# Patient Record
Sex: Male | Born: 1970 | Race: White | Hispanic: No | Marital: Married | Smoking: Current every day smoker
Health system: Southern US, Community
[De-identification: ages and names within clinical notes are randomized; demographics above are authoritative.]

## PROBLEM LIST (undated history)

## (undated) DIAGNOSIS — M059 Rheumatoid arthritis with rheumatoid factor, unspecified: Secondary | ICD-10-CM

## (undated) DIAGNOSIS — Z21 Asymptomatic human immunodeficiency virus [HIV] infection status: Secondary | ICD-10-CM

## (undated) DIAGNOSIS — B20 Human immunodeficiency virus [HIV] disease: Secondary | ICD-10-CM

## (undated) DIAGNOSIS — I209 Angina pectoris, unspecified: Secondary | ICD-10-CM

## (undated) HISTORY — PX: OTHER SURGICAL HISTORY: SHX169

## (undated) HISTORY — DX: Rheumatoid arthritis with rheumatoid factor, unspecified: M05.9

## (undated) HISTORY — DX: Asymptomatic human immunodeficiency virus (hiv) infection status: Z21

## (undated) HISTORY — DX: Human immunodeficiency virus (HIV) disease: B20

## (undated) HISTORY — DX: Angina pectoris, unspecified: I20.9

## (undated) HISTORY — PX: KNEE ARTHROSCOPY: SUR90

---

## 2017-04-27 DIAGNOSIS — R079 Chest pain, unspecified: Secondary | ICD-10-CM

## 2018-04-08 DIAGNOSIS — IMO0002 Reserved for concepts with insufficient information to code with codable children: Secondary | ICD-10-CM | POA: Insufficient documentation

## 2018-06-19 DIAGNOSIS — M25861 Other specified joint disorders, right knee: Secondary | ICD-10-CM | POA: Diagnosis not present

## 2018-06-19 DIAGNOSIS — G8918 Other acute postprocedural pain: Secondary | ICD-10-CM | POA: Diagnosis not present

## 2018-06-19 DIAGNOSIS — M009 Pyogenic arthritis, unspecified: Secondary | ICD-10-CM | POA: Diagnosis not present

## 2018-06-19 DIAGNOSIS — D2121 Benign neoplasm of connective and other soft tissue of right lower limb, including hip: Secondary | ICD-10-CM | POA: Diagnosis not present

## 2018-06-19 DIAGNOSIS — M00861 Arthritis due to other bacteria, right knee: Secondary | ICD-10-CM | POA: Diagnosis not present

## 2018-06-21 DIAGNOSIS — D499 Neoplasm of unspecified behavior of unspecified site: Secondary | ICD-10-CM | POA: Insufficient documentation

## 2018-08-17 ENCOUNTER — Encounter (HOSPITAL_COMMUNITY): Payer: Self-pay | Admitting: *Deleted

## 2018-08-17 ENCOUNTER — Inpatient Hospital Stay (HOSPITAL_COMMUNITY)
Admission: EM | Admit: 2018-08-17 | Discharge: 2018-08-22 | DRG: 865 | Disposition: A | Discharge status: Home / Self Care | Payer: Medicaid Other | Attending: Internal Medicine | Admitting: Internal Medicine

## 2018-08-17 ENCOUNTER — Inpatient Hospital Stay (HOSPITAL_COMMUNITY): Payer: Medicaid Other

## 2018-08-17 ENCOUNTER — Other Ambulatory Visit: Payer: Self-pay

## 2018-08-17 DIAGNOSIS — R269 Unspecified abnormalities of gait and mobility: Secondary | ICD-10-CM | POA: Diagnosis present

## 2018-08-17 DIAGNOSIS — R64 Cachexia: Secondary | ICD-10-CM | POA: Diagnosis present

## 2018-08-17 DIAGNOSIS — K219 Gastro-esophageal reflux disease without esophagitis: Secondary | ICD-10-CM | POA: Diagnosis present

## 2018-08-17 DIAGNOSIS — Z681 Body mass index (BMI) 19 or less, adult: Secondary | ICD-10-CM | POA: Diagnosis not present

## 2018-08-17 DIAGNOSIS — D61818 Other pancytopenia: Secondary | ICD-10-CM

## 2018-08-17 DIAGNOSIS — M659 Synovitis and tenosynovitis, unspecified: Secondary | ICD-10-CM | POA: Diagnosis present

## 2018-08-17 DIAGNOSIS — E43 Unspecified severe protein-calorie malnutrition: Secondary | ICD-10-CM | POA: Diagnosis present

## 2018-08-17 DIAGNOSIS — M6250 Muscle wasting and atrophy, not elsewhere classified, unspecified site: Secondary | ICD-10-CM | POA: Diagnosis present

## 2018-08-17 DIAGNOSIS — D638 Anemia in other chronic diseases classified elsewhere: Secondary | ICD-10-CM | POA: Diagnosis present

## 2018-08-17 DIAGNOSIS — R5381 Other malaise: Secondary | ICD-10-CM | POA: Diagnosis present

## 2018-08-17 DIAGNOSIS — R531 Weakness: Secondary | ICD-10-CM

## 2018-08-17 DIAGNOSIS — R197 Diarrhea, unspecified: Secondary | ICD-10-CM | POA: Diagnosis present

## 2018-08-17 DIAGNOSIS — R21 Rash and other nonspecific skin eruption: Secondary | ICD-10-CM

## 2018-08-17 DIAGNOSIS — M25461 Effusion, right knee: Secondary | ICD-10-CM | POA: Diagnosis present

## 2018-08-17 DIAGNOSIS — Z8614 Personal history of Methicillin resistant Staphylococcus aureus infection: Secondary | ICD-10-CM | POA: Diagnosis present

## 2018-08-17 DIAGNOSIS — M069 Rheumatoid arthritis, unspecified: Secondary | ICD-10-CM | POA: Diagnosis present

## 2018-08-17 DIAGNOSIS — M25561 Pain in right knee: Secondary | ICD-10-CM | POA: Diagnosis present

## 2018-08-17 DIAGNOSIS — M25422 Effusion, left elbow: Secondary | ICD-10-CM | POA: Diagnosis present

## 2018-08-17 DIAGNOSIS — M25421 Effusion, right elbow: Secondary | ICD-10-CM | POA: Diagnosis present

## 2018-08-17 DIAGNOSIS — M25521 Pain in right elbow: Secondary | ICD-10-CM | POA: Diagnosis present

## 2018-08-17 DIAGNOSIS — M2550 Pain in unspecified joint: Secondary | ICD-10-CM

## 2018-08-17 DIAGNOSIS — M25462 Effusion, left knee: Secondary | ICD-10-CM | POA: Diagnosis present

## 2018-08-17 DIAGNOSIS — B20 Human immunodeficiency virus [HIV] disease: Secondary | ICD-10-CM | POA: Diagnosis present

## 2018-08-17 DIAGNOSIS — M25522 Pain in left elbow: Secondary | ICD-10-CM | POA: Diagnosis present

## 2018-08-17 DIAGNOSIS — L409 Psoriasis, unspecified: Secondary | ICD-10-CM | POA: Diagnosis present

## 2018-08-17 DIAGNOSIS — Z21 Asymptomatic human immunodeficiency virus [HIV] infection status: Principal | ICD-10-CM

## 2018-08-17 DIAGNOSIS — F17210 Nicotine dependence, cigarettes, uncomplicated: Secondary | ICD-10-CM | POA: Diagnosis present

## 2018-08-17 DIAGNOSIS — M130 Polyarthritis, unspecified: Secondary | ICD-10-CM | POA: Diagnosis present

## 2018-08-17 DIAGNOSIS — E876 Hypokalemia: Secondary | ICD-10-CM | POA: Diagnosis present

## 2018-08-17 DIAGNOSIS — M25562 Pain in left knee: Secondary | ICD-10-CM | POA: Diagnosis present

## 2018-08-17 DIAGNOSIS — E440 Moderate protein-calorie malnutrition: Secondary | ICD-10-CM | POA: Diagnosis present

## 2018-08-17 DIAGNOSIS — Z7982 Long term (current) use of aspirin: Secondary | ICD-10-CM

## 2018-08-17 DIAGNOSIS — R509 Fever, unspecified: Secondary | ICD-10-CM

## 2018-08-17 DIAGNOSIS — R2241 Localized swelling, mass and lump, right lower limb: Secondary | ICD-10-CM | POA: Diagnosis present

## 2018-08-17 DIAGNOSIS — A528 Late syphilis, latent: Secondary | ICD-10-CM | POA: Diagnosis present

## 2018-08-17 DIAGNOSIS — A5139 Other secondary syphilis of skin: Secondary | ICD-10-CM | POA: Diagnosis present

## 2018-08-17 DIAGNOSIS — E46 Unspecified protein-calorie malnutrition: Secondary | ICD-10-CM

## 2018-08-17 DIAGNOSIS — R634 Abnormal weight loss: Secondary | ICD-10-CM | POA: Diagnosis present

## 2018-08-17 DIAGNOSIS — M542 Cervicalgia: Secondary | ICD-10-CM | POA: Diagnosis present

## 2018-08-17 DIAGNOSIS — R0989 Other specified symptoms and signs involving the circulatory and respiratory systems: Secondary | ICD-10-CM

## 2018-08-17 DIAGNOSIS — Z885 Allergy status to narcotic agent status: Secondary | ICD-10-CM

## 2018-08-17 DIAGNOSIS — R52 Pain, unspecified: Secondary | ICD-10-CM

## 2018-08-17 LAB — COMPREHENSIVE METABOLIC PANEL
ALT: 58 U/L — ABNORMAL HIGH (ref 0–44)
AST: 62 U/L — ABNORMAL HIGH (ref 15–41)
Albumin: 2.2 g/dL — ABNORMAL LOW (ref 3.5–5.0)
Alkaline Phosphatase: 168 U/L — ABNORMAL HIGH (ref 38–126)
Anion gap: 10 (ref 5–15)
BILIRUBIN TOTAL: 1 mg/dL (ref 0.3–1.2)
BUN: 15 mg/dL (ref 6–20)
CALCIUM: 8.4 mg/dL — AB (ref 8.9–10.3)
CHLORIDE: 98 mmol/L (ref 98–111)
CO2: 29 mmol/L (ref 22–32)
Creatinine, Ser: 0.72 mg/dL (ref 0.61–1.24)
Glucose, Bld: 110 mg/dL — ABNORMAL HIGH (ref 70–99)
Potassium: 3.8 mmol/L (ref 3.5–5.1)
Sodium: 137 mmol/L (ref 135–145)
Total Protein: 8.7 g/dL — ABNORMAL HIGH (ref 6.5–8.1)

## 2018-08-17 LAB — CK: Total CK: 21 U/L — ABNORMAL LOW (ref 49–397)

## 2018-08-17 LAB — RAPID HIV SCREEN (HIV 1/2 AB+AG)
HIV 1/2 ANTIBODIES: REACTIVE — AB
HIV-1 P24 ANTIGEN - HIV24: NONREACTIVE

## 2018-08-17 LAB — CBC WITH DIFFERENTIAL/PLATELET
Basophils Absolute: 0 10*3/uL (ref 0.0–0.1)
Basophils Relative: 0 %
EOS PCT: 0 %
Eosinophils Absolute: 0 10*3/uL (ref 0.0–0.7)
HCT: 30.1 % — ABNORMAL LOW (ref 39.0–52.0)
Hemoglobin: 9.4 g/dL — ABNORMAL LOW (ref 13.0–17.0)
LYMPHS PCT: 21 %
Lymphs Abs: 0.6 10*3/uL — ABNORMAL LOW (ref 0.7–4.0)
MCH: 25.8 pg — AB (ref 26.0–34.0)
MCHC: 31.2 g/dL (ref 30.0–36.0)
MCV: 82.5 fL (ref 78.0–100.0)
MONO ABS: 0.2 10*3/uL (ref 0.1–1.0)
MONOS PCT: 7 %
Neutro Abs: 2.3 10*3/uL (ref 1.7–7.7)
Neutrophils Relative %: 72 %
Platelets: 149 10*3/uL — ABNORMAL LOW (ref 150–400)
RBC: 3.65 MIL/uL — ABNORMAL LOW (ref 4.22–5.81)
RDW: 15.4 % (ref 11.5–15.5)
WBC: 3.1 10*3/uL — ABNORMAL LOW (ref 4.0–10.5)

## 2018-08-17 LAB — T4, FREE: FREE T4: 1.21 ng/dL (ref 0.82–1.77)

## 2018-08-17 LAB — SEDIMENTATION RATE: Sed Rate: >140 mm/hr — ABNORMAL HIGH (ref 0–16)

## 2018-08-17 LAB — C-REACTIVE PROTEIN: CRP: 19.3 mg/dL — ABNORMAL HIGH (ref <1.0)

## 2018-08-17 LAB — TSH: TSH: 3.047 u[IU]/mL (ref 0.350–4.500)

## 2018-08-17 LAB — MAGNESIUM: Magnesium: 2.1 mg/dL (ref 1.7–2.4)

## 2018-08-17 LAB — LIPASE, BLOOD: LIPASE: 24 U/L (ref 11–51)

## 2018-08-17 LAB — I-STAT CG4 LACTIC ACID, ED: Lactic Acid, Venous: 0.72 mmol/L (ref 0.5–1.9)

## 2018-08-17 MED ORDER — ENSURE ENLIVE PO LIQD
237.0000 mL | Freq: Two times a day (BID) | ORAL | Status: DC
  Administered 2018-08-18 – 2018-08-22 (×3): 237 mL via ORAL

## 2018-08-17 MED ORDER — PIPERACILLIN-TAZOBACTAM 3.375 G IVPB
3.3750 g | Freq: Once | INTRAVENOUS | Status: AC
  Administered 2018-08-18: 3.375 g via INTRAVENOUS
  Filled 2018-08-17: qty 50

## 2018-08-17 MED ORDER — ACETAMINOPHEN 325 MG PO TABS: 650.0000 mg | ORAL_TABLET | Freq: Four times a day (QID) | ORAL | Status: DC | PRN

## 2018-08-17 MED ORDER — MORPHINE SULFATE (PF) 4 MG/ML IV SOLN
4.0000 mg | Freq: Once | INTRAVENOUS | Status: AC
  Administered 2018-08-17: 4 mg via INTRAVENOUS
  Filled 2018-08-17: qty 1

## 2018-08-17 MED ORDER — SODIUM CHLORIDE 0.9 % IV BOLUS
1000.0000 mL | Freq: Once | INTRAVENOUS | Status: AC
  Administered 2018-08-17: 1000 mL via INTRAVENOUS

## 2018-08-17 MED ORDER — ONDANSETRON HCL 4 MG/2ML IJ SOLN: 4.0000 mg | Freq: Four times a day (QID) | INTRAMUSCULAR | Status: DC | PRN

## 2018-08-17 MED ORDER — PIPERACILLIN-TAZOBACTAM 3.375 G IVPB 30 MIN
3.3750 g | Freq: Once | INTRAVENOUS | Status: AC
  Administered 2018-08-17: 3.375 g via INTRAVENOUS
  Filled 2018-08-17: qty 50

## 2018-08-17 MED ORDER — SODIUM CHLORIDE 0.9 % IV SOLN
INTRAVENOUS | Status: DC
  Administered 2018-08-17 – 2018-08-21 (×7): via INTRAVENOUS

## 2018-08-17 MED ORDER — VANCOMYCIN HCL IN DEXTROSE 1-5 GM/200ML-% IV SOLN
1000.0000 mg | Freq: Once | INTRAVENOUS | Status: AC
  Administered 2018-08-17: 1000 mg via INTRAVENOUS
  Filled 2018-08-17: qty 200

## 2018-08-17 MED ORDER — ENOXAPARIN SODIUM 40 MG/0.4ML ~~LOC~~ SOLN
40.0000 mg | SUBCUTANEOUS | Status: DC
  Administered 2018-08-17 – 2018-08-21 (×5): 40 mg via SUBCUTANEOUS
  Filled 2018-08-17 (×5): qty 0.4

## 2018-08-17 MED ORDER — TRAMADOL HCL 50 MG PO TABS
50.0000 mg | ORAL_TABLET | Freq: Two times a day (BID) | ORAL | Status: DC | PRN
  Administered 2018-08-17: 50 mg via ORAL
  Filled 2018-08-17: qty 1

## 2018-08-17 NOTE — Progress Notes (Signed)
Received report from Chelsea, RN

## 2018-08-17 NOTE — ED Provider Notes (Signed)
[facility] DEPT Provider Note   CSN: [id number] Arrival date & time: 08/17/18  1306     History   Chief Complaint Chief Complaint  Patient presents with  . Weakness    HPI Patrick Huber is a 47 y.o. male presenting for evaluation of weakness and polyarthralgia.  Patient states he has been doing poorly since he had knee surgery earlier this summer.  Patient states the end of June he had a biopsy of the his right knee, which was shown to be noncancerous.  Orthopedics went back in and cleaned out his right knee several weeks later.  Initially he was doing better, and then has been doing worse.  He reports immediate diarrhea following p.o. consumption since the surgery.  Over the past several days, he has had significant increase in weakness, unable to get from his bed to the bathroom to have bowel movements.  As such, he has not been eating or drinking water.  Patient states he has had a 60 to 70 pound weight loss over the past few months since his surgery.  He reports frequent chills, cousin states he has been feeling very hot to the touch.  He has followed up with orthopedics following his surgeries, was maybe found to have an infection.  He was on Bactrim as of August, but has not been on any since.  Additionally, patient states he has been developing skin lesions since his surgery.  He has lesions on his right knee, toes, fingers, and scalp.  He do not itch or drain.  Patient denies other medical problems, takes only aspirin daily.  He has not been taking anything for pain, but reporting severe pain of bilateral knees and bilateral elbows. Patient denies a history of diabetes.  Denies a history of HIV.  Additional history obtained from chart review, patient had surgery with [facility] orthopedics.  Most recent note on August 16 shows concern for infection.  Unknown if patient followed up afterwards, although patient states he went to [facility] ER, had  fluid drained from his knee, and discussion with orthopedics yielded no plan of care.  Patient lives at home by himself.  Currently using a walker due to significant weakness.   HPI  No past medical history on file.  Patient Active Problem List   Diagnosis Date Noted  . Generalized weakness 08/17/2018       Home Medications    Prior to Admission medications   Medication Sig Start Date End Date Taking? Authorizing Provider  aspirin 81 MG tablet Take 81 mg by mouth daily.   Yes [provider]  ibuprofen (ADVIL,MOTRIN) 200 MG tablet Take 200 mg by mouth 2 (two) times daily as needed for moderate pain.   Yes [provider]  loperamide (IMODIUM) 2 MG capsule Take 2 mg by mouth as needed for diarrhea or loose stools.   Yes [provider]  Menthol-Methyl Salicylate (MUSCLE RUB) 10-15 % CREA Apply 1 application topically as needed for muscle pain.   Yes [provider]    Family History No family history on file.  Social History Social History   Tobacco Use  . Smoking status: Not on file  Substance Use Topics  . Alcohol use: Not on file  . Drug use: Not on file     Allergies   Meperidine   Review of Systems Review of Systems  Constitutional: Positive for chills, fever (subjective) and unexpected weight change.  Gastrointestinal: Positive for diarrhea.  Musculoskeletal: Positive for arthralgias and joint swelling.  Skin: Positive for wound.  Neurological: Positive for weakness.  All other systems reviewed and are negative.    Physical Exam Updated Vital Signs BP 116/70   Pulse 97   Temp (!) 100.6 F (38.1 C) (Rectal)   Resp 16   Ht 6\' 1"  (1.854 m)   Wt 68 kg   SpO2 97%   BMI 19.79 kg/m   Physical Exam  Constitutional: He is oriented to person, place, and time.  Cachectic male who appears uncomfortable due to pain.  Feels warm to the touch  HENT:  Head: Normocephalic and atraumatic.  MM dry  Eyes: Pupils are  equal, round, and reactive to light. Conjunctivae and EOM are normal.  Neck: Normal range of motion. Neck supple.  Cardiovascular: Normal rate, regular rhythm and intact distal pulses.  Pulmonary/Chest: Effort normal and breath sounds normal. No respiratory distress. He has no wheezes.  Abdominal: Soft. He exhibits no distension and no mass. There is no tenderness. There is no rebound and no guarding.  Musculoskeletal: Normal range of motion. He exhibits edema and tenderness.  Bilateral knee and bilateral elbow effusions without erythema or warmth.  Patient able to range with pain.  Neurological: He is alert and oriented to person, place, and time. No sensory deficit.  Skin: Skin is warm and dry. Capillary refill takes less than 2 seconds.  Multiple skin lesions.  See pictures below.  Scabbing/plaque forming lesions of the right knee and in the scalp.  Additionally, skin changes under all finger and toenails. No surrounding erythema. No drainage. No fluctuance   Psychiatric: He has a normal mood and affect.  Nursing note and vitals reviewed.                ED Treatments / Results  Labs (all labs ordered are listed, but only abnormal results are displayed) Labs Reviewed  CBC WITH DIFFERENTIAL/PLATELET - Abnormal; Notable for the following components:      Result Value   WBC 3.1 (*)    RBC 3.65 (*)    Hemoglobin 9.4 (*)    HCT 30.1 (*)    MCH 25.8 (*)    Platelets 149 (*)    Lymphs Abs 0.6 (*)    All other components within normal limits  COMPREHENSIVE METABOLIC PANEL - Abnormal; Notable for the following components:   Glucose, Bld 110 (*)    Calcium 8.4 (*)    Total Protein 8.7 (*)    Albumin 2.2 (*)    AST 62 (*)    ALT 58 (*)    Alkaline Phosphatase 168 (*)    All other components within normal limits  RAPID HIV SCREEN (HIV 1/2 AB+AG) - Abnormal; Notable for the following components:   HIV 1/2 Antibodies Reactive (*)    All other components within normal limits    C DIFFICILE QUICK SCREEN W PCR REFLEX  CULTURE, BLOOD (ROUTINE X 2)  CULTURE, BLOOD (ROUTINE X 2)  URINE CULTURE  LIPASE, BLOOD  TSH  URINALYSIS, ROUTINE W REFLEX MICROSCOPIC  T4, FREE  C-REACTIVE PROTEIN  SEDIMENTATION RATE  CK  MAGNESIUM  HIV-1/2 AB - DIFFERENTIATION  CBC  BASIC METABOLIC PANEL  I-STAT CG4 LACTIC ACID, ED  I-STAT CG4 LACTIC ACID, ED    EKG None  Radiology No results found.  Procedures Procedures (including critical care time)  Medications Ordered in ED Medications  vancomycin (VANCOCIN) IVPB 1000 mg/200 mL premix (1,000 mg Intravenous New Bag/Given 08/17/18 1811)  enoxaparin (LOVENOX) injection 40 mg (has no administration in time range)  ondansetron (ZOFRAN) injection 4 mg (has no administration in time range)  acetaminophen (TYLENOL) tablet 650 mg (has no administration in time range)  feeding supplement (ENSURE ENLIVE) (ENSURE ENLIVE) liquid 237 mL (has no administration in time range)  0.9 %  sodium chloride infusion (has no administration in time range)  sodium chloride 0.9 % bolus 1,000 mL (0 mLs Intravenous Stopped 08/17/18 1702)  piperacillin-tazobactam (ZOSYN) IVPB 3.375 g (3.375 g Intravenous New Bag/Given 08/17/18 1702)  morphine 4 MG/ML injection 4 mg (4 mg Intravenous Given 08/17/18 1703)     Initial Impression / Assessment and Plan / ED Course  I have reviewed the triage vital signs and the nursing notes.  Pertinent labs & imaging results that were available during my care of the patient were reviewed by me and considered in my medical decision making (see chart for details).     Patient presenting for evaluation of weakness, weight loss, and polyarthralgias.  Physical exam shows cachectic appearing male with dry mucous membranes.  Has swelling of bilateral knees and elbows.  Multiple skin lesions noted.  Patient feels very warm to the touch, initially tachycardic at 106.  This improved with fluids given by EMS.  Chart review was  concerning, as patient appears to have been having knee infections following surgery.  Will obtain basic labs, blood cultures, lactic, HIV.  Fluid and pain medication given.  Case discussed with attending, Dr. Rex Kras evaluated the patient.  Will add CK, inflammatory markers, and mag.  Rectal temp positive at 100.6.  Antibiotics started.  As patient has polyarthralgia and poly-joint swelling with abnormal skin changes, consider possible autoimmune disease.  Patient thinks he may have a family history of autoimmune disease including psoriasis and/or lupus.  Labs show pancytopenia, with white count low at 3.1, hemoglobin low at 9.4, platelets low at 160.  No AKI.  Lactic negative.  TSH normal.  Will plan for admission due to pancytopenia and fever.  Patient malnourished with significant weight loss, I am concerned about his ability to go home.  Consider need for autoimmune work-up while in the hospital.  Discussed with Dr. Nevada Crane, patient to be admitted.  Informed by RN that patient's rapid HIV was positive, will collect further testing. Pt not informed of this result.   Final Clinical Impressions(s) / ED Diagnoses   Final diagnoses:  Pancytopenia ([place])  Weakness  Fever, unspecified fever cause  Polyarthralgia  Skin eruption  Malnutrition, unspecified type ([place])  HIV test positive [facility])    ED Discharge Orders    None       Franchot Heidelberg, PA-C 08/17/18 1814    Little, Wenda Overland, MD 08/17/18 2120

## 2018-08-17 NOTE — ED Notes (Signed)
ED TO INPATIENT HANDOFF REPORT  Name/Age/Gender Patrick Huber 47 y.o. male  Code Status    Code Status Orders  (From admission, onward)         Start     Ordered   08/17/18 1739  Full code  Continuous     08/17/18 1738        Code Status History    This patient has a current code status but no historical code status.      Home/SNF/Other Home  Chief Complaint wekness x2 days  Level of Care/Admitting Diagnosis ED Disposition    ED Disposition Condition [facility] Area: [facility] [[id number]]  Level of Care: Med-Surg [16]  Diagnosis: Generalized weakness [470962]  Admitting Physician: Kayleen Memos [[id number]]  Attending Physician: Kayleen Memos [[id number]]  Estimated length of stay: past midnight tomorrow  Certification:: I certify this patient will need inpatient services for at least 2 midnights  PT Class (Do Not Modify): Inpatient [101]  PT Acc Code (Do Not Modify): Private [1]       Medical History No past medical history on file.  Allergies Allergies  Allergen Reactions  . Meperidine Anaphylaxis    IV Location/Drains/Wounds Patient Lines/Drains/Airways Status   Active Line/Drains/Airways    Name:   Placement date:   Placement time:   Site:   Days:   Peripheral IV 08/17/18 Left Hand   08/17/18    1437    Hand   less than 1          Labs/Imaging Results for orders placed or performed during the hospital encounter of 08/17/18 (from the past 48 hour(s))  I-Stat CG4 Lactic Acid, ED     Status: None   Collection Time: 08/17/18  3:24 PM  Result Value Ref Range   Lactic Acid, Venous 0.72 0.5 - 1.9 mmol/L  CBC with Differential     Status: Abnormal   Collection Time: 08/17/18  3:27 PM  Result Value Ref Range   WBC 3.1 (L) 4.0 - 10.5 K/uL   RBC 3.65 (L) 4.22 - 5.81 MIL/uL   Hemoglobin 9.4 (L) 13.0 - 17.0 g/dL   HCT 30.1 (L) 39.0 - 52.0 %   MCV 82.5 78.0 - 100.0 fL   MCH 25.8 (L) 26.0 - 34.0 pg   MCHC 31.2 30.0  - 36.0 g/dL   RDW 15.4 11.5 - 15.5 %   Platelets 149 (L) 150 - 400 K/uL   Neutrophils Relative % 72 %   Neutro Abs 2.3 1.7 - 7.7 K/uL   Lymphocytes Relative 21 %   Lymphs Abs 0.6 (L) 0.7 - 4.0 K/uL   Monocytes Relative 7 %   Monocytes Absolute 0.2 0.1 - 1.0 K/uL   Eosinophils Relative 0 %   Eosinophils Absolute 0.0 0.0 - 0.7 K/uL   Basophils Relative 0 %   Basophils Absolute 0.0 0.0 - 0.1 K/uL    Comment: Performed at [facility], [place] [address], [place], [place] [address]  Comprehensive metabolic panel     Status: Abnormal   Collection Time: 08/17/18  3:27 PM  Result Value Ref Range   Sodium 137 135 - 145 mmol/L   Potassium 3.8 3.5 - 5.1 mmol/L   Chloride 98 98 - 111 mmol/L   CO2 29 22 - 32 mmol/L   Glucose, Bld 110 (H) 70 - 99 mg/dL   BUN 15 6 - 20 mg/dL   Creatinine, Ser 0.72 0.61 - 1.24 mg/dL  Calcium 8.4 (L) 8.9 - 10.3 mg/dL   Total Protein 8.7 (H) 6.5 - 8.1 g/dL   Albumin 2.2 (L) 3.5 - 5.0 g/dL   AST 62 (H) 15 - 41 U/L   ALT 58 (H) 0 - 44 U/L   Alkaline Phosphatase 168 (H) 38 - 126 U/L   Total Bilirubin 1.0 0.3 - 1.2 mg/dL   GFR calc non Af Amer >60 >60 mL/min   GFR calc Af Amer >60 >60 mL/min    Comment: (NOTE) The eGFR has been calculated using the CKD EPI equation. This calculation has not been validated in all clinical situations. eGFR's persistently <60 mL/min signify possible Chronic Kidney Disease.    Anion gap 10 5 - 15    Comment: Performed at [facility] [place] [address] [place], [place] [address]  Lipase, blood     Status: None   Collection Time: 08/17/18  3:27 PM  Result Value Ref Range   Lipase 24 11 - 51 U/L    Comment: Performed at [facility], [place] [address] [place], [place] [address]  Rapid HIV screen (HIV 1/2 Ab+Ag)     Status: Abnormal   Collection Time: 08/17/18  3:27 PM  Result Value Ref Range   HIV-1 P24 Antigen - HIV24 NON REACTIVE NON REACTIVE   HIV 1/2 Antibodies Reactive (A)  NON REACTIVE    Comment: REPEATED TO VERIFY   Interpretation (HIV Ag Ab)      A reactive test result means that HIV 1 or HIV 2 antibodies have been detected in the specimen. The test result is interpreted as Preliminary Positive for HIV 1 and/or HIV 2 antibodies.    Comment: SENT FOR CONFIRMATION RESULT CALLED TO, READ BACK BY AND VERIFIED WITH: C.Charnell Peplinski AT 1727 ON 08/17/18 BY N.THOMPSON Performed at [facility] [place] [address], [place], [place] [address]   TSH     Status: None   Collection Time: 08/17/18  3:29 PM  Result Value Ref Range   TSH 3.047 0.350 - 4.500 uIU/mL    Comment: Performed by a 3rd Generation assay with a functional sensitivity of <=0.01 uIU/mL. Performed at [facility], [place] [address] [place], [place] [address]    No results found.  Pending Labs Unresulted Labs (From admission, onward)    Start     Ordered   08/18/18 0500  CBC  Tomorrow morning,   R     08/17/18 1807   08/18/18 8101  Basic metabolic panel  Tomorrow morning,   R     08/17/18 1807   08/17/18 1650  Magnesium  STAT,   STAT     08/17/18 1649   08/17/18 1611  Urine culture  STAT,   STAT     08/17/18 1611   08/17/18 1531  C-reactive protein  Once,   STAT     08/17/18 1530   08/17/18 1531  Sedimentation rate  STAT,   STAT     08/17/18 1530   08/17/18 1531  CK  STAT,   STAT     08/17/18 1530   08/17/18 1529  T4, free  STAT,   STAT     08/17/18 1528   08/17/18 1527  HIV-1/2 AB - differentiation  Once,   STAT     08/17/18 1527   08/17/18 1511  Blood culture (routine x 2)  BLOOD CULTURE X 2,   STAT     08/17/18 1510   08/17/18 1511  Urinalysis,  Routine w reflex microscopic  Once,   R     08/17/18 1510   08/17/18 1510  C difficile quick scan w PCR reflex  (C Difficile quick screen w PCR reflex panel)  Once, for 24 hours,   R     08/17/18 1510          Vitals/Pain Today's Vitals   08/17/18 1600 08/17/18 1625 08/17/18 1630 08/17/18 1700  BP: 105/84   111/70 116/70  Pulse: 95  91 97  Resp:    16  Temp:      TempSrc:      SpO2: 100%  100% 97%  Weight:  68 kg    Height:  6' 1"  (1.854 m)      Isolation Precautions No active isolations  Medications Medications  vancomycin (VANCOCIN) IVPB 1000 mg/200 mL premix (1,000 mg Intravenous New Bag/Given 08/17/18 1811)  enoxaparin (LOVENOX) injection 40 mg (has no administration in time range)  ondansetron (ZOFRAN) injection 4 mg (has no administration in time range)  acetaminophen (TYLENOL) tablet 650 mg (has no administration in time range)  feeding supplement (ENSURE ENLIVE) (ENSURE ENLIVE) liquid 237 mL (has no administration in time range)  0.9 %  sodium chloride infusion (has no administration in time range)  traMADol (ULTRAM) tablet 50 mg (has no administration in time range)  sodium chloride 0.9 % bolus 1,000 mL (0 mLs Intravenous Stopped 08/17/18 1702)  piperacillin-tazobactam (ZOSYN) IVPB 3.375 g (0 g Intravenous Stopped 08/17/18 1732)  morphine 4 MG/ML injection 4 mg (4 mg Intravenous Given 08/17/18 1703)    Mobility walks with person assist

## 2018-08-17 NOTE — ED Notes (Signed)
ED Provider at bedside. 

## 2018-08-17 NOTE — Progress Notes (Signed)
A consult was received from an ED physician for vancomycin and piperacillin/tazobactam per pharmacy dosing.  The patient's profile has been reviewed for ht/wt/allergies/indication/available labs.    A one time order has been placed for piperacillin/tazobactam 3.375 g and vancomycin 1000 mg (no weight in chart so unable to assess weight based loading dose).  Order entered for height/weight.  Further antibiotics/pharmacy consults should be ordered by admitting physician if indicated.                       Thank you, Lenis Noon, PharmD, BCPS Clinical Pharmacist 08/17/2018  4:21 PM

## 2018-08-17 NOTE — H&P (Signed)
History and Physical  Patrick Huber IOX:[id number] DOB: January 29, 1971 DOA: 08/17/2018  Referring physician: Dr  Junious Silk PCP: Patient, No Pcp Per  Outpatient Specialists: Orthopedic surgery at [facility] Patient coming from: Home Chief Complaint: Generalized weakness and weight loss   HPI: Patrick Huber is a 47 y.o. male with medical history significant for right knee mass status post resection in July at [facility], tobacco use disorder who presented from home to [facility] ED due to gradually worsening generalized weakness and severe right knee pain of 3 weeks duration.  Reports intermittent chills and subjective fevers, new skin lesions affecting his fingers, toes and scalp.  Significant family history of psoriasis and eczema.  In July had a mass on his right knee which was removed at [facility].  States since then about 3 weeks ago started having diffused joint pain associated with generalized weakness.  Went to [facility] and was asked to return to his orthopedic surgeon.  States he was unable to make the appointment due to high co-pay.  Also reports diarrhea after eating.  As a result has abstained from eating for 4 days.   ED Course: Febrile with T-max of 100.6; poor physical appearance with right knee effusion, cachectic.  Lab studies significant for pancytopenia.  Blood cultures drawn x2 peripherally, Inflammatory markers ordered.  [facility] asked to admit.  Review of Systems: Review of systems as noted in the HPI. All other systems reviewed and are negative.   Past medical history significant for right knee mass and tobacco use disorder.   Social History: Smokes about 1 pack/day for at least 10 years, denies use of alcohol and illicit drugs.   Allergies  Allergen Reactions  . Meperidine Anaphylaxis    No family history on file.  Mother deceased with ovarian cancer, father deceased with emphysema.  Prior to Admission medications   Medication Sig Start Date End Date Taking? Authorizing Provider    aspirin 81 MG tablet Take 81 mg by mouth daily.   Yes [provider]  ibuprofen (ADVIL,MOTRIN) 200 MG tablet Take 200 mg by mouth 2 (two) times daily as needed for moderate pain.   Yes [provider]  loperamide (IMODIUM) 2 MG capsule Take 2 mg by mouth as needed for diarrhea or loose stools.   Yes [provider]  Menthol-Methyl Salicylate (MUSCLE RUB) 10-15 % CREA Apply 1 application topically as needed for muscle pain.   Yes [provider]    Physical Exam: BP 102/73   Pulse 93   Temp (!) 100.6 F (38.1 C) (Rectal)   Resp 18   Ht 6\' 1"  (1.854 m)   Wt 68 kg   SpO2 97%   BMI 19.79 kg/m   . General: 47 y.o. year-old male cachectic, poorly kept, appears uncomfortable due to severe right knee pain.  Alert and oriented x3. . Cardiovascular: Regular rate and rhythm with no rubs or gallops.  No thyromegaly or JVD noted.  No lower extremity edema. 2/4 pulses in all 4 extremities. Marland Kitchen Respiratory: Clear to auscultation with no wheezes or rales. Good inspiratory effort. . Abdomen: Soft nontender nondistended with normal bowel sounds x4 quadrants. . Muskuloskeletal: Right knee joint effusion with large scar from prior surgery.  Psoriatic appearing lesions noted on fingers toes and scalp.   Marland Kitchen Neuro: CN II-XII intact, strength, sensation, reflexes . Skin: No ulcerative lesions noted or rashes; scar as noted above and psoriatic lesions noted. Marland Kitchen Psychiatry: Judgement and insight appear normal. Mood is appropriate for condition and  setting          Labs on Admission:  Basic Metabolic Panel: Recent Labs  Lab 08/17/18 1527  NA 137  K 3.8  CL 98  CO2 29  GLUCOSE 110*  BUN 15  CREATININE 0.72  CALCIUM 8.4*   Liver Function Tests: Recent Labs  Lab 08/17/18 1527  AST 62*  ALT 58*  ALKPHOS 168*  BILITOT 1.0  PROT 8.7*  ALBUMIN 2.2*   Recent Labs  Lab 08/17/18 1527  LIPASE 24   No results for input(s): AMMONIA in the last 168  hours. CBC: Recent Labs  Lab 08/17/18 1527  WBC 3.1*  NEUTROABS 2.3  HGB 9.4*  HCT 30.1*  MCV 82.5  PLT 149*   Cardiac Enzymes: No results for input(s): CKTOTAL, CKMB, CKMBINDEX, TROPONINI in the last 168 hours.  BNP (last 3 results) No results for input(s): BNP in the last 8760 hours.  ProBNP (last 3 results) No results for input(s): PROBNP in the last 8760 hours.  CBG: No results for input(s): GLUCAP in the last 168 hours.  Radiological Exams on Admission: No results found.  EKG: I independently viewed the EKG done and my findings are as followed: No EKG available at the time of this dictation.  Assessment/Plan Present on Admission: **None**  Active Problems:   Generalized weakness  Generalized weakness/physical debility of unclear etiology Inflammatory markers ordered and pending Rule out infective process HIV 1/2 antibioties reactive  Consider consulting ID in the morning Blood cultures drawn peripherally x2 Monitor fever curve Monitor WBC  Status post right knee mass resection Procedure done at [facility] in July 2019 Obtain x-ray of right knee If abnormal consult orthopedic surgery  Pancytopenia, unclear etiology WBC 3.1 Hemoglobin 9.4 Platelet 149 Repeat CBC in the morning  Severe protein calorie malnutrition Albumin 2.2 BMI 19 Start oral supplementation Dietary consult to address nutritional needs  Tobacco use disorder Tobacco cessation counseling done at bedside Nicotine patch  Skin lesions Appearance of severe psoriasis particularly on the scalp Patient has not seen a dermatologist May consider ID consult to rule out infective process if becomes problematic  Ambulatory dysfunction secondary to severe right knee pain Obtain x-ray of right knee Fall precautions PT to assess in the morning  Patient will require at least 2 midnights for further testings and treatment of present condition.   DVT prophylaxis: Subcu Lovenox daily  Code  Status: Full code  Family Communication: Cousin at bedside  Disposition Plan: Admit to MedSurg  Consults called: None  Admission status: Inpatient status    Kayleen Memos MD [facility] Pager [phone]  If 7PM-7AM, please contact night-coverage [email or web address] Password [facility]  08/17/2018, 5:39 PM

## 2018-08-17 NOTE — ED Triage Notes (Signed)
Transported by [facility]--generalized weakness, diarrhea and loss of appetite x 4 days. Patient also reports extreme weight loss since bilateral knee surgery in July. Patient has wounds located on his scalp, arms and legs.

## 2018-08-18 ENCOUNTER — Encounter (HOSPITAL_COMMUNITY): Payer: Self-pay | Admitting: *Deleted

## 2018-08-18 DIAGNOSIS — R634 Abnormal weight loss: Secondary | ICD-10-CM | POA: Diagnosis present

## 2018-08-18 DIAGNOSIS — F17210 Nicotine dependence, cigarettes, uncomplicated: Secondary | ICD-10-CM | POA: Diagnosis present

## 2018-08-18 DIAGNOSIS — R197 Diarrhea, unspecified: Secondary | ICD-10-CM | POA: Diagnosis present

## 2018-08-18 DIAGNOSIS — Z8614 Personal history of Methicillin resistant Staphylococcus aureus infection: Secondary | ICD-10-CM | POA: Diagnosis present

## 2018-08-18 DIAGNOSIS — E440 Moderate protein-calorie malnutrition: Secondary | ICD-10-CM | POA: Diagnosis present

## 2018-08-18 DIAGNOSIS — M069 Rheumatoid arthritis, unspecified: Secondary | ICD-10-CM | POA: Diagnosis present

## 2018-08-18 DIAGNOSIS — D61818 Other pancytopenia: Secondary | ICD-10-CM | POA: Diagnosis present

## 2018-08-18 DIAGNOSIS — M130 Polyarthritis, unspecified: Secondary | ICD-10-CM | POA: Diagnosis present

## 2018-08-18 DIAGNOSIS — M2550 Pain in unspecified joint: Secondary | ICD-10-CM | POA: Diagnosis present

## 2018-08-18 DIAGNOSIS — R2241 Localized swelling, mass and lump, right lower limb: Secondary | ICD-10-CM | POA: Diagnosis present

## 2018-08-18 DIAGNOSIS — L409 Psoriasis, unspecified: Secondary | ICD-10-CM | POA: Diagnosis present

## 2018-08-18 DIAGNOSIS — K219 Gastro-esophageal reflux disease without esophagitis: Secondary | ICD-10-CM | POA: Diagnosis present

## 2018-08-18 DIAGNOSIS — B20 Human immunodeficiency virus [HIV] disease: Secondary | ICD-10-CM | POA: Diagnosis present

## 2018-08-18 LAB — CBC
HEMATOCRIT: 27.1 % — AB (ref 39.0–52.0)
HEMOGLOBIN: 8.3 g/dL — AB (ref 13.0–17.0)
MCH: 25.6 pg — AB (ref 26.0–34.0)
MCHC: 30.6 g/dL (ref 30.0–36.0)
MCV: 83.6 fL (ref 78.0–100.0)
Platelets: 161 10*3/uL (ref 150–400)
RBC: 3.24 MIL/uL — ABNORMAL LOW (ref 4.22–5.81)
RDW: 15.7 % — ABNORMAL HIGH (ref 11.5–15.5)
WBC: 2.6 10*3/uL — ABNORMAL LOW (ref 4.0–10.5)

## 2018-08-18 LAB — BASIC METABOLIC PANEL
Anion gap: 10 (ref 5–15)
BUN: 15 mg/dL (ref 6–20)
CHLORIDE: 100 mmol/L (ref 98–111)
CO2: 27 mmol/L (ref 22–32)
CREATININE: 0.61 mg/dL (ref 0.61–1.24)
Calcium: 8.1 mg/dL — ABNORMAL LOW (ref 8.9–10.3)
GFR calc non Af Amer: >60 mL/min (ref >60)
Glucose, Bld: 108 mg/dL — ABNORMAL HIGH (ref 70–99)
Potassium: 3.4 mmol/L — ABNORMAL LOW (ref 3.5–5.1)
Sodium: 137 mmol/L (ref 135–145)

## 2018-08-18 LAB — URINALYSIS, ROUTINE W REFLEX MICROSCOPIC
BILIRUBIN URINE: NEGATIVE
Bacteria, UA: NONE SEEN
GLUCOSE, UA: NEGATIVE mg/dL
KETONES UR: NEGATIVE mg/dL
LEUKOCYTES UA: NEGATIVE
NITRITE: NEGATIVE
PH: 5 (ref 5.0–8.0)
PROTEIN: 100 mg/dL — AB
Specific Gravity, Urine: 1.029 (ref 1.005–1.030)

## 2018-08-18 LAB — MRSA PCR SCREENING: MRSA BY PCR: NEGATIVE

## 2018-08-18 LAB — T-HELPER CELLS (CD4) COUNT (NOT AT ARMC)
CD4 T CELL HELPER: 3 % — AB (ref 33–55)
CD4 T Cell Abs: 20 /uL — ABNORMAL LOW (ref 400–2700)

## 2018-08-18 MED ORDER — OXYCODONE HCL 5 MG PO TABS
5.0000 mg | ORAL_TABLET | ORAL | Status: DC | PRN
  Administered 2018-08-18 – 2018-08-22 (×16): 5 mg via ORAL
  Filled 2018-08-18 (×17): qty 1

## 2018-08-18 MED ORDER — ACETAMINOPHEN 325 MG PO TABS: 650.0000 mg | ORAL_TABLET | Freq: Four times a day (QID) | ORAL | Status: DC | PRN

## 2018-08-18 MED ORDER — VANCOMYCIN HCL IN DEXTROSE 1-5 GM/200ML-% IV SOLN
1000.0000 mg | Freq: Once | INTRAVENOUS | Status: AC
  Administered 2018-08-18: 1000 mg via INTRAVENOUS
  Filled 2018-08-18: qty 200

## 2018-08-18 MED ORDER — TRAMADOL HCL 50 MG PO TABS
50.0000 mg | ORAL_TABLET | Freq: Two times a day (BID) | ORAL | Status: DC | PRN
  Administered 2018-08-18 – 2018-08-21 (×4): 50 mg via ORAL
  Filled 2018-08-18 (×4): qty 1

## 2018-08-18 MED ORDER — POTASSIUM CHLORIDE CRYS ER 20 MEQ PO TBCR
40.0000 meq | EXTENDED_RELEASE_TABLET | Freq: Once | ORAL | Status: AC
  Administered 2018-08-18: 40 meq via ORAL
  Filled 2018-08-18: qty 2

## 2018-08-18 MED ORDER — VANCOMYCIN HCL IN DEXTROSE 1-5 GM/200ML-% IV SOLN: 1000.0000 mg | Freq: Two times a day (BID) | INTRAVENOUS | Status: DC

## 2018-08-18 MED ORDER — PIPERACILLIN-TAZOBACTAM 3.375 G IVPB
3.3750 g | Freq: Three times a day (TID) | INTRAVENOUS | Status: DC
  Administered 2018-08-18 (×2): 3.375 g via INTRAVENOUS
  Filled 2018-08-18 (×3): qty 50

## 2018-08-18 NOTE — Progress Notes (Signed)
Pharmacy Antibiotic Note  Ryun Velez is a 47 y.o. male admitted on 08/17/2018 with sepsis.  Pharmacy has been consulted for Vancomycin and Zosyn dosing.  Plan: Zosyn 3.375g IV q8h (4 hour infusion).   Vancomycin 1gm iv x1, then 1gm iv q12hr  Goal AUC = 400 - 500 for all indications, except meningitis (goal AUC > 500 and Cmin 15-20 mcg/mL)   Height: 6\' 1"  (185.4 cm) Weight: 150 lb 5.7 oz (68.2 kg) IBW/kg (Calculated) : 79.9  Temp (24hrs), Avg:99 F (37.2 C), Min:98.4 F (36.9 C), Max:100.6 F (38.1 C)  Recent Labs  Lab 08/17/18 1524 08/17/18 1527 08/18/18 0337  WBC  --  3.1* 2.6*  CREATININE  --  0.72 0.61  LATICACIDVEN 0.72  --   --     Estimated Creatinine Clearance: 111.3 mL/min (by C-G formula based on SCr of 0.61 mg/dL).    Allergies  Allergen Reactions  . Meperidine Anaphylaxis    Antimicrobials this admission: Vancomycin 08/17/2018 >> Zosyn 08/17/2018 >>   Dose adjustments this admission: -  Microbiology results: -  Thank you for allowing pharmacy to be a part of this patient's care.  Nani Skillern Crowford 08/18/2018 6:36 AM

## 2018-08-18 NOTE — Subjective & Objective (Signed)
[facility] for Infectious Disease    Date of Admission:  08/17/2018   On antibiotics for the past 2 months        Day 2 piperacillin tazobactam             Reason for Consult: HIV antibody positive    Referring Provider: Dr. Francia Greaves  Assessment: This is a very complicated situation.  We do not have confirmatory testing for his HIV antibody everything certainly points toward advanced HIV infection given his pancytopenia, low CD4 count and extreme unintentional weight loss.  I will go ahead and complete a baseline work-up for HIV infection and consider starting therapy tomorrow.  He also has an unusual right knee mass and a positive Gram stain from a biopsy in March and a positive culture for MRSA in July.  He has received 2 months of oral trimethoprim sulfamethoxazole.  His right knee does not appear infected currently and his recent arthrocentesis at [facility] was relatively benign.  Given his severe diarrhea I favor stopping all antibiotics now.  I will obtain stool for C. difficile screen, GI pathogen panel and AFB stain and cultures.  I am not sure what explained to is more generalized joint pain and swelling.  I would recommend orthopedic evaluation and consider a repeat MRI of his right knee.  I am not entirely sure what is causing his skin and scalp lesions.  HIV can cause a variety of skin dilatations including psoriasis.  Plan: 1. Confirmatory HIV antibody and HIV viral load 2. Discontinue piperacillin tazobactam and observe off of antibiotics for now 3. Recommend orthopedic evaluation 4. Check hepatitis B and C antibodies, RPR, GC, chlamydia screens, HLA B 5701 and QuantiFERON TB assay 5. Stool for C. difficile screen, GI pathogen panel and AFB stain and culture 6. Enteric precautions in addition to contact precautions  Principal Problem:   HIV antibody positive ([place]) Active Problems:   History of MRSA infection   Generalized weakness   Malnutrition  of moderate degree   Diarrhea   Unintentional weight loss   Chronic arthralgias of knees and hips   Knee mass, right   Pancytopenia (HCC)   Cigarette smoker   GERD (gastroesophageal reflux disease)   Rash   Scheduled Meds: . enoxaparin (LOVENOX) injection  40 mg Subcutaneous Q24H  . feeding supplement (ENSURE ENLIVE)  237 mL Oral BID BM   Continuous Infusions: . sodium chloride 75 mL/hr at 08/17/18 2239  . piperacillin-tazobactam (ZOSYN)  IV 3.375 g (08/18/18 1445)  . 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  He was seen in the emergency department in [facility] on 02/16/2018 and underwent arthrocentesis of his right knee.  This showed 4703 white blood cells with 43% segmented neutrophils and 57% monocytes.  There were no crystals seen.  There were no organisms on Gram stain and cultures were negative.  He was referred to [facility] orthopedics where he was seen in early April.  Initially there was concern for prepatellar bursitis.  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  He underwent biopsy of the mass on 05/01/2018.  Pathology showed synovitis with edema and acute and  chronic inflammation.  Gram stain showed gram-positive cocci but cultures were negative.  He underwent repeat, open biopsy on 06/19/2018 which showed benign synovium with reactive and degenerative changes.  Cultures grew MRSA.  He was started on oral trimethoprim sulfamethoxazole which he has been on ever since.  He continued to have right knee pain and developed severe watery diarrhea.  He has lost 60 pounds since that time.  He was seen back by his orthopedist on 07/16/2018.  He was instructed to go to the ED for admission and arthrocentesis of  both his left and right knee.  He was worried about co-pays.  He then went to the [facility] ED on 07/27/2018.  He underwent arthrocentesis of his right knee which showed 2977 white blood cells of which 8% were segmented neutrophils and 79% were other cells.  His culture was negative.  They noted minor dehiscence of his right knee incision.  He has continued to do poorly.  He has had trouble getting out of bed and has had continued diarrhea and weight loss.  In addition to pain in both knees he has now developed some pain in his neck and in both elbows leading to admission here yesterday.  He developed a low grade temperature of 100.6 degrees.  He has pancytopenia.  He was started on broad empiric antibiotics after blood cultures were obtained.  HIV antibody screen was obtained and is positive.  Confirmatory testing is pending but his CD4 count is extremely low at only 20.  He denies known risk factors for HIV infection.  He says that he has had only 2 lifetime sexual contacts his wife who he is separated from his current girlfriend.  He denies any illicit drug use and specifically injecting drug use.  He has had one professionally done tattoo on his right arm.  He has never had a blood transfusion.  He denies any history of other sexually transmitted diseases.  He initially told me that he has been tested for HIV in the past and said that he was not sure.   Review of Systems: Review of Systems  Constitutional: Positive for chills, fever, malaise/fatigue and weight loss. Negative for diaphoresis.  HENT: Negative for congestion and sore throat.   Eyes: Negative for blurred vision and double vision.  Respiratory: Negative for cough, sputum production and shortness of breath.   Cardiovascular: Negative for chest pain.  Gastrointestinal: Positive for diarrhea. Negative for abdominal pain, heartburn, nausea and vomiting.  Genitourinary: Negative for dysuria and frequency.  Musculoskeletal: Positive  for joint pain. Negative for back pain and myalgias.  Skin: Positive for itching and rash.  Neurological: Negative for dizziness and headaches.  Psychiatric/Behavioral: Negative for depression and substance abuse. The patient is not nervous/anxious.     History reviewed. No pertinent past medical history.  Social History   Tobacco Use  . Smoking status: Current Every Day Smoker    Packs/day: 1.00    Years: 25.00    Pack years: 25.00    Types: Cigarettes  . Smokeless tobacco: Never Used  Substance Use Topics  . Alcohol use: Not Currently  . Drug use: Never    History reviewed. No pertinent family history. Allergies  Allergen Reactions  . Meperidine Anaphylaxis    OBJECTIVE: Blood pressure 112/65, pulse 77, temperature 98.4 F (36.9 C), temperature source Oral, resp. rate 17, height 6' 1"  (1.854 m), weight 68.2 kg, SpO2 100 %.  Physical Exam  Constitutional: He is oriented to person, place, and time.  He is alert pleasant resting comfortably in bed.  He is cachectic.  HENT:  Mouth/Throat: No oropharyngeal exudate.  He is edentulous.  Eyes: Conjunctivae are normal.  Neck:  His neck is stiff and he has discomfort when turning to the left.  Cardiovascular: Normal rate, regular rhythm and normal heart sounds.  No murmur heard. Pulmonary/Chest: Effort normal and breath sounds normal.  Abdominal: Soft. He exhibits no distension and no mass. There is no tenderness.  Musculoskeletal: Normal range of motion. He exhibits edema and tenderness.  He has diffuse swelling and slight warmth of both elbows.  I do not detect any effusions of his knees.  He has a healing scar on the medial side of his right knee.  Neurological: He is alert and oriented to person, place, and time.  Skin: Rash noted.  He has a silver scaly rash in his groin.  He has extensive scalp lesions with crusting and scabs.  Psychiatric: He has a normal mood and affect.    Lab Results Lab Results  Component  Value Date   WBC 2.6 (L) 08/18/2018   HGB 8.3 (L) 08/18/2018   HCT 27.1 (L) 08/18/2018   MCV 83.6 08/18/2018   PLT 161 08/18/2018    Lab Results  Component Value Date   CREATININE 0.61 08/18/2018   BUN 15 08/18/2018   NA 137 08/18/2018   K 3.4 (L) 08/18/2018   CL 100 08/18/2018   CO2 27 08/18/2018    Lab Results  Component Value Date   ALT 58 (H) 08/17/2018   AST 62 (H) 08/17/2018   ALKPHOS 168 (H) 08/17/2018   BILITOT 1.0 08/17/2018     Microbiology: Recent Results (from the past 240 hour(s))  Blood culture (routine x 2)     Status: None (Preliminary result)   Collection Time: 08/17/18  3:27 PM  Result Value Ref Range Status   Specimen Description   Final    BLOOD RIGHT HAND Performed at [facility], [place] [address] [place], [place] [address]    Special Requests   Final    BOTTLES DRAWN AEROBIC AND ANAEROBIC Blood Culture adequate volume Performed at [place] [facility] [address], [place], [place] [address]    Culture   Final    NO GROWTH < 24 HOURS Performed at [place] [address], [place], [place] [address]    Report Status PENDING  Incomplete  Blood culture (routine x 2)     Status: None (Preliminary result)   Collection Time: 08/17/18  3:36 PM  Result Value Ref Range Status   Specimen Description   Final    BLOOD RIGHT HAND Performed at [facility] [address], [place], [place] [address]    Special Requests   Final    BOTTLES DRAWN AEROBIC AND ANAEROBIC Blood Culture results may not be optimal due to an excessive volume of blood received in culture bottles Performed at [facility] [address], [place], [place] [address]    Culture   Final    NO GROWTH < 24 HOURS Performed at [place] [address], [place], [place] [address]    Report Status PENDING  Incomplete  MRSA PCR Screening     Status: None   Collection Time: 08/18/18  7:20 AM  Result  Value Ref Range Status   MRSA by PCR NEGATIVE NEGATIVE Final    Comment:        The GeneXpert  MRSA Assay (FDA approved for NASAL specimens only), is one component of a comprehensive MRSA colonization surveillance program. It is not intended to diagnose MRSA infection nor to guide or monitor treatment for MRSA infections. Performed at [facility], [place] [address], [place], [place] [address]     Michel Bickers, [place] [facility] [phone] pager   [phone] cell 08/18/2018, 4:45 PM

## 2018-08-18 NOTE — Progress Notes (Signed)
Initial Nutrition Assessment  DOCUMENTATION CODES:   Non-severe (moderate) malnutrition in context of acute illness/injury  INTERVENTION:   Continue Ensure Enlive po BID, each supplement provides 350 kcal and 20 grams of protein  NUTRITION DIAGNOSIS:   Moderate Malnutrition related to acute illness, post-op healing, diarrhea, decreased appetite as evidenced by percent weight loss, energy intake < or equal to 75% for > or equal to 1 month, moderate fat depletion, moderate muscle depletion.  GOAL:   Patient will meet greater than or equal to 90% of their needs  MONITOR:   PO intake, Supplement acceptance, Labs, Weight trends, Skin, I & O's  REASON FOR ASSESSMENT:   Consult Assessment of nutrition requirement/status  ASSESSMENT:   47 y.o. male with medical history significant for right knee mass status post resection in July at [facility], tobacco use disorder who presented from home to [facility] ED due to gradually worsening generalized weakness and severe right knee pain of 3 weeks duration.    Patient reports eating a good breakfast this morning of an omelet and toast. Pt states he has not been eating well d/t pain since surgery in July 2019 (s/p resection of mass in right knee). Pt states he may eat 1 meal a day, typically lunch. Tends to not eat breakfast and forgets to eat dinner. Pt did not eat for 4 days d/t diarrhea PTA.  Pt is drinking Ensure supplements and encouraged pt to continue some form of protein shake after discharge.  Pt with noticeable difficulty lifting arms. Had to assist patient with glasses and menu.   Per patient UBW is 180-210 lb. Pt has lost 30 lb since July 2019 (17% wt loss x 2 months, significant for time frame).  Medications reviewed. Labs reviewed: Low K   NUTRITION - FOCUSED PHYSICAL EXAM:    Most Recent Value  Orbital Region  No depletion  Upper Arm Region  Moderate depletion  Thoracic and Lumbar Region  Unable to assess  Buccal Region  Mild depletion   Temple Region  Moderate depletion  Clavicle Bone Region  Moderate depletion  Clavicle and Acromion Bone Region  Moderate depletion  Scapular Bone Region  Unable to assess  Dorsal Hand  Mild depletion  Patellar Region  Unable to assess  Anterior Thigh Region  Unable to assess  Posterior Calf Region  Unable to assess  Edema (RD Assessment)  None       Diet Order:   Diet Order            Diet regular Room service appropriate? Yes; Fluid consistency: Thin  Diet effective now              EDUCATION NEEDS:   Education needs have been addressed  Skin:  Skin Assessment: Reviewed RN Assessment  Last BM:  9/14  Height:   Ht Readings from Last 1 Encounters:  08/17/18 6\' 1"  (1.854 m)    Weight:   Wt Readings from Last 1 Encounters:  08/17/18 68.2 kg    Ideal Body Weight:  83.6 kg  BMI:  Body mass index is 19.84 kg/m.  Estimated Nutritional Needs:   Kcal:  2000-2200  Protein:  90-100g  Fluid:  2L/day   Patrick Bibles, MS, RD, LDN [place] Dietitian Pager: [phone] After Hours Pager: [phone]

## 2018-08-18 NOTE — Progress Notes (Addendum)
PROGRESS NOTE  Patrick Huber LNL:[id number] DOB: 1971/03/10 DOA: 08/17/2018 PCP: Patient, No Pcp Per  HPI/Recap of past 24 hours: Patrick Huber is a 47 y.o. male with medical history significant for right knee mass status post resection in July at [facility], tobacco use disorder who presented from home to [facility] ED due to gradually worsening generalized weakness and severe right knee pain of 3 weeks duration.  Reports intermittent chills and subjective fevers, new skin lesions affecting his fingers, toes and scalp.  Significant family history of psoriasis and eczema.  In July had a mass on his right knee which was removed at [facility].  States since then about 3 weeks ago started having diffused joint pain associated with generalized weakness.  Went to [facility] and was asked to return to his orthopedic surgeon.  States he was unable to make the appointment due to high co-pay.  Also reports diarrhea after eating.  As a result has abstained from eating for 4 days.  08/18/18: Patient seen and examined at his bedside.  He reports diffused severe joint pain rash affecting his fingers toes and scalp.  Increased pain medications with some relief.  HIV screening test positive.  Ordered confirmatory tests viral load and CD4.  ID also consulted.   Assessment/Plan: Active Problems:   Generalized weakness   Malnutrition of moderate degree  Generalized weakness/physical debility of unclear etiology Significantly elevated inflammatory markers with ESR greater than 140 and CRP of 18 Rule out infective process HIV 1/2 antibioties reactive-reports 2 lifetime male sexual partners; his separated wife and his girlfriend. ID consulted.  Highly appreciated. Blood cultures drawn peripherally x2 in process Ordered viral load HIV RNA and CD4 count, pending Continue to monitor fever curve Continue to monitor WBC  Resolved AKI Baseline creatinine 0.6 Continue to avoid nephrotoxic  agents/dehydration/hypotension Monitor urine output  Hypokalemia Potassium 3.4 Repleted with oral potassium  Status post right knee mass resection Procedure done at [facility] in July 2019 X-ray of right knee with small suprapatellar bursa effusion  No sign of fracture or dislocation or infection  Leukopenia and normocytic anemia, unclear etiology WBC 2.6 and hemoglobin 8.3 No sign of overt bleeding On antibiotics empirically IV Zosyn Discontinue vancomycin due to negative MRSA  Severe protein calorie malnutrition Albumin 2.2 BMI 19 Continue oral supplementation Dietary consult to address nutritional needs  Tobacco use disorder Tobacco cessation counseling done at bedside Nicotine patch  Skin lesions Appearance of severe psoriasis particularly on the scalp Patient has not seen a dermatologist  Ambulatory dysfunction secondary to severe knee joint pain bilaterally Fall precautions PT to assess in the morning  Risks: Patient is a high risk for decompensation due to severely elevated inflammatory markers, severe diffuse joint pain in the setting of a positive HIV screen test.  HIV will need to be ruled out.  Awaiting confirmatory testing results.  Infectious disease consulted to further assess.   Code Status: Full code  Family Communication: None at bedside  Disposition Plan: Home in 1 to 2 days or when infectious disease signs of   Consultants:  Infectious disease  Procedures:  None  Antimicrobials:  IV Zosyn  DVT prophylaxis: Subcu Lovenox   Objective: Vitals:   08/17/18 1630 08/17/18 1700 08/17/18 2143 08/18/18 0628  BP: 111/70 116/70 110/73 112/65  Pulse: 91 97 84 77  Resp:  _0 Temp:   98.7 F (37.1 C) 98.4 F (36.9 C)  TempSrc:   Oral Oral  SpO2: 100% 97% 100%  100%  Weight:   68.2 kg   Height:   _0  (1.854 m)     Intake/Output Summary (Last 24 hours) at 08/18/2018 1421 Last data filed at 08/18/2018 0900 Gross per 24 hour  Intake  1494.04 ml  Output 1025 ml  Net 469.04 ml   Filed Weights   08/17/18 1625 08/17/18 2143  Weight: 68 kg 68.2 kg    Exam:  . General: 47 y.o. year-old male frail appears uncomfortable due to severe diffuse joint pain. . Cardiovascular: Regular rate and rhythm with no rubs or gallops.  No thyromegaly or JVD noted. Marland Kitchen Respiratory: Clear to auscultation with no wheezes or rales. Good inspiratory effort. . Abdomen: Soft nontender nondistended with normal bowel sounds x4 quadrants. . Musculoskeletal: Knees elbows appear inflamed bilaterally. . Skin: Severe psoriatic appearing lesions affecting scalp toes and fingers. Marland Kitchen Psychiatry: Mood is appropriate for condition and setting   Data Reviewed: CBC: Recent Labs  Lab 08/17/18 1527 08/18/18 0337  WBC 3.1* 2.6*  NEUTROABS 2.3  --   HGB 9.4* 8.3*  HCT 30.1* 27.1*  MCV 82.5 83.6  PLT 149* 147   Basic Metabolic Panel: Recent Labs  Lab 08/17/18 1527 08/17/18 1915 08/18/18 0337  NA 137  --  137  K 3.8  --  3.4*  CL 98  --  100  CO2 29  --  27  GLUCOSE 110*  --  108*  BUN 15  --  15  CREATININE 0.72  --  0.61  CALCIUM 8.4*  --  8.1*  MG  --  2.1  --    GFR: Estimated Creatinine Clearance: 111.3 mL/min (by C-G formula based on SCr of 0.61 mg/dL). Liver Function Tests: Recent Labs  Lab 08/17/18 1527  AST 62*  ALT 58*  ALKPHOS 168*  BILITOT 1.0  PROT 8.7*  ALBUMIN 2.2*   Recent Labs  Lab 08/17/18 1527  LIPASE 24   No results for input(s): AMMONIA in the last 168 hours. Coagulation Profile: No results for input(s): INR, PROTIME in the last 168 hours. Cardiac Enzymes: Recent Labs  Lab 08/17/18 1915  CKTOTAL 21*   BNP (last 3 results) No results for input(s): PROBNP in the last 8760 hours. HbA1C: No results for input(s): HGBA1C in the last 72 hours. CBG: No results for input(s): GLUCAP in the last 168 hours. Lipid Profile: No results for input(s): CHOL, HDL, LDLCALC, TRIG, CHOLHDL, LDLDIRECT in the last 72  hours. Thyroid Function Tests: Recent Labs    08/17/18 1529 08/17/18 1909  TSH 3.047  --   FREET4  --  1.21   Anemia Panel: No results for input(s): VITAMINB12, FOLATE, FERRITIN, TIBC, IRON, RETICCTPCT in the last 72 hours. Urine analysis:    Component Value Date/Time   COLORURINE AMBER (A) 08/18/2018 0550   APPEARANCEUR HAZY (A) 08/18/2018 0550   LABSPEC 1.029 08/18/2018 0550   PHURINE 5.0 08/18/2018 0550   GLUCOSEU NEGATIVE 08/18/2018 0550   HGBUR SMALL (A) 08/18/2018 0550   BILIRUBINUR NEGATIVE 08/18/2018 0550   KETONESUR NEGATIVE 08/18/2018 0550   PROTEINUR 100 (A) 08/18/2018 0550   NITRITE NEGATIVE 08/18/2018 0550   LEUKOCYTESUR NEGATIVE 08/18/2018 0550   Sepsis Labs: _1 (procalcitonin:4,lacticidven:4)  ) Recent Results (from the past 240 hour(s))  Blood culture (routine x 2)     Status: None (Preliminary result)   Collection Time: 08/17/18  3:27 PM  Result Value Ref Range Status   Specimen Description   Final    BLOOD RIGHT HAND Performed at [facility]  [place] [address], [place], [place] [address]    Special Requests   Final    BOTTLES DRAWN AEROBIC AND ANAEROBIC Blood Culture adequate volume Performed at [facility] [address] [place], [place] [address]    Culture   Final    NO GROWTH < 24 HOURS Performed at [facility] [address] [place], [place] [address]    Report Status PENDING  Incomplete  Blood culture (routine x 2)     Status: None (Preliminary result)   Collection Time: 08/17/18  3:36 PM  Result Value Ref Range Status   Specimen Description   Final    BLOOD RIGHT HAND Performed at [facility] [address] [place], [place] [address]    Special Requests   Final    BOTTLES DRAWN AEROBIC AND ANAEROBIC Blood Culture results may not be optimal due to an excessive volume of blood received in culture bottles Performed at [facility]  [address] [place], [place] [address]    Culture   Final    NO GROWTH < 24 HOURS Performed at [place] [address], [place], [place] [address]    Report Status PENDING  Incomplete  MRSA PCR Screening     Status: None   Collection Time: 08/18/18  7:20 AM  Result Value Ref Range Status   MRSA by PCR NEGATIVE NEGATIVE Final    Comment:        The GeneXpert MRSA Assay (FDA approved for NASAL specimens only), is one component of a comprehensive MRSA colonization surveillance program. It is not intended to diagnose MRSA infection nor to guide or monitor treatment for MRSA infections. Performed at [facility], [place] [address], [place], [place] [address]       Studies: Dg Knee Right Port  Result Date: 08/17/2018 CLINICAL DATA:  Knee pain. EXAM: PORTABLE RIGHT KNEE - 1-2 VIEW COMPARISON:  07/27/2018. FINDINGS: No evidence of fracture or dislocation. Small suprapatellar bursa effusion. No signs of infection. Osteopenia. IMPRESSION: No evidence of fracture or dislocation. Small suprapatellar bursa effusion. Similar appearance to priors. Electronically Signed   By: Staci Righter M.D.   On: 08/17/2018 18:57    Scheduled Meds: . enoxaparin (LOVENOX) injection  40 mg Subcutaneous Q24H  . feeding supplement (ENSURE ENLIVE)  237 mL Oral BID BM    Continuous Infusions: . sodium chloride 75 mL/hr at 08/17/18 2239  . piperacillin-tazobactam (ZOSYN)  IV 3.375 g (08/18/18 0706)  . vancomycin       LOS: 1 day     Kayleen Memos, MD [facility] Pager [phone]  If 7PM-7AM, please contact night-coverage [email or web address] Password [email or web address] 08/18/2018, 2:21 PM

## 2018-08-19 ENCOUNTER — Inpatient Hospital Stay (HOSPITAL_COMMUNITY): Payer: Medicaid Other

## 2018-08-19 DIAGNOSIS — Z681 Body mass index (BMI) 19 or less, adult: Secondary | ICD-10-CM

## 2018-08-19 DIAGNOSIS — Z885 Allergy status to narcotic agent status: Secondary | ICD-10-CM

## 2018-08-19 DIAGNOSIS — M25551 Pain in right hip: Secondary | ICD-10-CM

## 2018-08-19 DIAGNOSIS — B379 Candidiasis, unspecified: Secondary | ICD-10-CM

## 2018-08-19 DIAGNOSIS — M542 Cervicalgia: Secondary | ICD-10-CM

## 2018-08-19 DIAGNOSIS — R634 Abnormal weight loss: Secondary | ICD-10-CM

## 2018-08-19 DIAGNOSIS — F17210 Nicotine dependence, cigarettes, uncomplicated: Secondary | ICD-10-CM

## 2018-08-19 DIAGNOSIS — M25521 Pain in right elbow: Secondary | ICD-10-CM

## 2018-08-19 DIAGNOSIS — M25552 Pain in left hip: Secondary | ICD-10-CM

## 2018-08-19 DIAGNOSIS — M25562 Pain in left knee: Secondary | ICD-10-CM

## 2018-08-19 DIAGNOSIS — D72819 Decreased white blood cell count, unspecified: Secondary | ICD-10-CM

## 2018-08-19 DIAGNOSIS — R21 Rash and other nonspecific skin eruption: Secondary | ICD-10-CM

## 2018-08-19 DIAGNOSIS — G8929 Other chronic pain: Secondary | ICD-10-CM

## 2018-08-19 DIAGNOSIS — Z7952 Long term (current) use of systemic steroids: Secondary | ICD-10-CM

## 2018-08-19 DIAGNOSIS — R197 Diarrhea, unspecified: Secondary | ICD-10-CM

## 2018-08-19 DIAGNOSIS — M25522 Pain in left elbow: Secondary | ICD-10-CM

## 2018-08-19 DIAGNOSIS — R2241 Localized swelling, mass and lump, right lower limb: Secondary | ICD-10-CM

## 2018-08-19 DIAGNOSIS — D61818 Other pancytopenia: Secondary | ICD-10-CM

## 2018-08-19 DIAGNOSIS — Z8614 Personal history of Methicillin resistant Staphylococcus aureus infection: Secondary | ICD-10-CM

## 2018-08-19 DIAGNOSIS — L989 Disorder of the skin and subcutaneous tissue, unspecified: Secondary | ICD-10-CM

## 2018-08-19 DIAGNOSIS — M25561 Pain in right knee: Secondary | ICD-10-CM

## 2018-08-19 DIAGNOSIS — B59 Pneumocystosis: Secondary | ICD-10-CM

## 2018-08-19 LAB — CREATININE, SERUM: Creatinine, Ser: 0.55 mg/dL — ABNORMAL LOW (ref 0.61–1.24)

## 2018-08-19 LAB — URINE CULTURE: Culture: <10000 — AB

## 2018-08-19 LAB — CBC
HEMATOCRIT: 26.4 % — AB (ref 39.0–52.0)
Hemoglobin: 8.1 g/dL — ABNORMAL LOW (ref 13.0–17.0)
MCH: 25.9 pg — ABNORMAL LOW (ref 26.0–34.0)
MCHC: 30.7 g/dL (ref 30.0–36.0)
MCV: 84.3 fL (ref 78.0–100.0)
Platelets: 148 10*3/uL — ABNORMAL LOW (ref 150–400)
RBC: 3.13 MIL/uL — ABNORMAL LOW (ref 4.22–5.81)
RDW: 16.1 % — AB (ref 11.5–15.5)
WBC: 2.5 10*3/uL — AB (ref 4.0–10.5)

## 2018-08-19 LAB — HIV-1/2 AB - DIFFERENTIATION
HIV 1 AB: POSITIVE — AB
HIV 2 AB: NEGATIVE

## 2018-08-19 LAB — HEPATITIS C ANTIBODY (REFLEX): HCV AB: 0.3 {s_co_ratio} (ref 0.0–0.9)

## 2018-08-19 LAB — RPR, QUANT+TP ABS (REFLEX): T Pallidum Abs: POSITIVE — AB

## 2018-08-19 LAB — RPR: RPR: REACTIVE — AB

## 2018-08-19 LAB — POTASSIUM: POTASSIUM: 3.7 mmol/L (ref 3.5–5.1)

## 2018-08-19 LAB — HEPATITIS B SURFACE ANTIGEN: Hepatitis B Surface Ag: NEGATIVE

## 2018-08-19 LAB — HEPATITIS B SURFACE ANTIBODY, QUANTITATIVE: Hepatitis B-Post: <3.1 m[IU]/mL — ABNORMAL LOW (ref >9.9)

## 2018-08-19 LAB — HCV COMMENT:

## 2018-08-19 MED ORDER — BICTEGRAVIR-EMTRICITAB-TENOFOV 50-200-25 MG PO TABS
1.0000 | ORAL_TABLET | Freq: Every day | ORAL | Status: DC
  Administered 2018-08-19 – 2018-08-22 (×4): 1 via ORAL
  Filled 2018-08-19 (×4): qty 1

## 2018-08-19 MED ORDER — FLUCONAZOLE 100 MG PO TABS
100.0000 mg | ORAL_TABLET | ORAL | Status: DC
  Administered 2018-08-19: 100 mg via ORAL
  Filled 2018-08-19: qty 1

## 2018-08-19 MED ORDER — SULFAMETHOXAZOLE-TRIMETHOPRIM 800-160 MG PO TABS
1.0000 | ORAL_TABLET | Freq: Every day | ORAL | Status: DC
  Administered 2018-08-19 – 2018-08-22 (×4): 1 via ORAL
  Filled 2018-08-19 (×4): qty 1

## 2018-08-19 NOTE — Consult Note (Signed)
This note was originally written and entered on 08/18/2018 at [facility]    Date of Admission:  08/17/2018   On antibiotics for the past 2 months                                                                                     Day 2 piperacillin tazobactam                                                                                                                                      Reason for Consult: HIV antibody positive                              Referring Provider: Dr. Francia Greaves  Assessment: This is a very complicated situation.  We do not have confirmatory testing for his HIV antibody everything certainly points toward advanced HIV infection given his pancytopenia, low CD4 count and extreme unintentional weight loss.  I will go ahead and complete a baseline work-up for HIV infection and consider starting therapy tomorrow.  He also has an unusual right knee mass and a positive Gram stain from a biopsy in March and a positive culture for MRSA in July.  He has received 2 months of oral trimethoprim sulfamethoxazole.  His right knee does not appear infected currently and his recent arthrocentesis at [facility] was relatively benign.  Given his severe diarrhea I favor stopping all antibiotics now.  I will obtain stool for C. difficile screen, GI pathogen panel and AFB stain and cultures.  I am not sure what explained to is more generalized joint pain and swelling.  I would recommend orthopedic evaluation and consider a repeat MRI of his right knee.  I am not entirely sure what is causing his skin and scalp lesions.  HIV can cause a variety of skin dilatations including psoriasis.  Plan: 1. Confirmatory HIV antibody and HIV viral load 2. Discontinue piperacillin tazobactam and observe off of antibiotics for now 3. Recommend orthopedic evaluation 4. Check hepatitis B and C antibodies, RPR, GC, chlamydia screens, HLA B 5701 and  QuantiFERON TB assay 5. Stool for C. difficile screen, GI pathogen panel and AFB stain and culture 6. Enteric precautions in addition to contact precautions  Principal Problem:   HIV test positive ([name]) Active Problems:   History of MRSA infection   Generalized weakness   Malnutrition of moderate degree   Diarrhea   Unintentional weight loss   Chronic arthralgias  of knees and hips   Knee mass, right   Pancytopenia (HCC)   Cigarette smoker   GERD (gastroesophageal reflux disease)   Rash   Scheduled Meds: . bictegravir-emtricitabine-tenofovir AF  1 tablet Oral Daily  . enoxaparin (LOVENOX) injection  40 mg Subcutaneous Q24H  . feeding supplement (ENSURE ENLIVE)  237 mL Oral BID BM  . fluconazole  100 mg Oral Weekly  . sulfamethoxazole-trimethoprim  1 tablet Oral Daily   Continuous Infusions: . 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  He was seen in the emergency department in [facility] on 02/16/2018 and underwent arthrocentesis of his right knee.  This showed 4703 white blood cells with 43% segmented neutrophils and 57% monocytes.  There were no crystals seen.  There were no organisms on Gram stain and cultures were negative.  He was referred to [facility] orthopedics where he was seen in early April.  Initially there was concern for prepatellar bursitis.  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  He underwent biopsy of the mass on 05/01/2018.  Pathology showed synovitis with edema and acute and chronic inflammation.  Gram stain showed gram-positive cocci but cultures were negative.  He underwent repeat, open biopsy on 06/19/2018 which showed benign synovium with reactive  and degenerative changes.  Cultures grew MRSA.  He was started on oral trimethoprim sulfamethoxazole which he has been on ever since.  He continued to have right knee pain and developed severe watery diarrhea.  He has lost 60 pounds since that time.  He was seen back by his orthopedist on 07/16/2018.  He was instructed to go to the ED for admission and arthrocentesis of both his left and right knee.  He was worried about co-pays.  He then went to the [facility] ED on 07/27/2018.  He underwent arthrocentesis of his right knee which showed 2977 white blood cells of which 8% were segmented neutrophils and 79% were other cells.  His culture was negative.  They noted minor dehiscence of his right knee incision.  He has continued to do poorly.  He has had trouble getting out of bed and has had continued diarrhea and weight loss.  In addition to pain in both knees he has now developed some pain in his neck and in both elbows leading to admission here yesterday.  He developed a low grade temperature of 100.6 degrees.  He has pancytopenia.  He was started on broad empiric antibiotics after blood cultures were obtained.  HIV antibody screen was obtained and is positive.  Confirmatory testing is pending but his CD4 count is extremely low at only 20.  He denies known risk factors for HIV infection.  He says that he has had only 2 lifetime sexual contacts his wife who he is separated from his current girlfriend.  He denies any illicit drug use and specifically injecting drug use.  He has had one professionally done tattoo on his right arm.  He has never had a blood transfusion.  He denies any history of other sexually transmitted diseases.  He initially told me that he has been tested for HIV in the past and said that he was not sure.  Review of Systems: Review of Systems  Constitutional: Positive for chills, fever, malaise/fatigue and weight loss. Negative for diaphoresis.  HENT: Negative for congestion and sore  throat.   Eyes: Negative for blurred vision and double vision.  Respiratory: Negative for cough, sputum production and shortness of breath.   Cardiovascular: Negative for chest pain.  Gastrointestinal: Positive for diarrhea. Negative for abdominal pain, heartburn, nausea and vomiting.  Genitourinary: Negative for dysuria and frequency.  Musculoskeletal: Positive for joint pain. Negative for back pain and myalgias.  Skin: Positive for itching and rash.  Neurological: Negative for dizziness and headaches.  Psychiatric/Behavioral: Negative for depression and substance abuse. The patient is not nervous/anxious.    History reviewed. No pertinent past medical history.  Social History   Tobacco Use  . Smoking status: Current Every Day Smoker    Packs/day: 1.00    Years: 25.00    Pack years: 25.00    Types: Cigarettes  . Smokeless tobacco: Never Used  Substance Use Topics  . Alcohol use: Not Currently  . Drug use: Never    History reviewed. No pertinent family history. Allergies  Allergen Reactions  . Meperidine Anaphylaxis    OBJECTIVE: Blood pressure 122/78, pulse 100, temperature 99.9 F (37.7 C), temperature source Oral, resp. rate 19, height 6' 1" (1.854 m), weight 68.2 kg, SpO2 96 %.  Physical Exam Constitutional: He is oriented to person, place, and time.  He is alert pleasant resting comfortably in bed.  He is cachectic.  HENT:  Mouth/Throat: No oropharyngeal exudate.  He is edentulous.  Eyes: Conjunctivae are normal.  Neck:  His neck is stiff and he has discomfort when turning to the left.  Cardiovascular: Normal rate, regular rhythm and normal heart sounds.  No murmur heard. Pulmonary/Chest: Effort normal and breath sounds normal.  Abdominal: Soft. He exhibits no distension and no mass. There is no tenderness.  Musculoskeletal: Normal range of motion. He exhibits edema and tenderness.  He has diffuse swelling and slight warmth of both elbows.  I do not detect any  effusions of his knees.  He has a healing scar on the medial side of his right knee.  Neurological: He is alert and oriented to person, place, and time.  Skin: Rash noted.  He has a silver scaly rash in his groin.  He has extensive scalp lesions with crusting and scabs.  Psychiatric: He has a normal mood and affect.   Lab Results Lab Results  Component Value Date   WBC 2.5 (L) 08/19/2018   HGB 8.1 (L) 08/19/2018   HCT 26.4 (L) 08/19/2018   MCV 84.3 08/19/2018   PLT 148 (L) 08/19/2018    Lab Results  Component Value Date   CREATININE 0.55 (L) 08/19/2018   BUN 15 08/18/2018   NA 137 08/18/2018   K 3.7 08/19/2018   CL 100 08/18/2018   CO2 27 08/18/2018    Lab Results  Component Value Date   ALT 58 (H) 08/17/2018   AST 62 (H) 08/17/2018   ALKPHOS 168 (H) 08/17/2018   BILITOT 1.0 08/17/2018    CD4 T Cell Abs (/uL)  Date Value  08/18/2018 20 (L)   Microbiology: Recent Results (from the past 240 hour(s))  Blood culture (routine x 2)     Status: None (Preliminary result)   Collection Time: 08/17/18  3:27 PM  Result Value Ref Range Status   Specimen Description   Final    BLOOD RIGHT HAND Performed at [facility], [place]  [address]bara [address]ower [place], [place] [address]    Special Requests   Final    BOTTLES DRAWN AEROBIC AND ANAEROBIC Blood Culture adequate volume Performed at [facility] [address] [place], [place] [address]    Culture   Final    NO GROWTH 2 DAYS Performed at [facility] [address] [place], [place] [address]    Report Status PENDING  Incomplete  Blood culture (routine x 2)     Status: None (Preliminary result)   Collection Time: 08/17/18  3:36 PM  Result Value Ref Range Status   Specimen Description   Final    BLOOD RIGHT HAND Performed at [facility] [address], [place], [place] [address]    Special Requests   Final    BOTTLES DRAWN AEROBIC AND ANAEROBIC Blood Culture results  may not be optimal due to an excessive volume of blood received in culture bottles Performed at [facility] [address] [place], [place] [address]    Culture   Final    NO GROWTH 2 DAYS Performed at [facility] [address], [place], [place] [address]    Report Status PENDING  Incomplete  Urine culture     Status: Abnormal   Collection Time: 08/18/18  5:50 AM  Result Value Ref Range Status   Specimen Description   Final    URINE, CLEAN CATCH Performed at [facility], [place] [address] [place], [place] [address]    Special Requests   Final    NONE Performed at [facility], [place] [address], [place], [place] [address]    Culture (A)  Final    <10,000 COLONIES/mL INSIGNIFICANT GROWTH Performed at [facility] [address] [place], [place] [address]    Report Status 08/19/2018 FINAL  Final  MRSA PCR Screening     Status: None   Collection Time: 08/18/18  7:20 AM  Result Value Ref Range Status   MRSA by PCR NEGATIVE NEGATIVE Final    Comment:        The GeneXpert MRSA Assay (FDA approved for NASAL specimens only), is one component of a comprehensive MRSA colonization surveillance program. It is not intended to diagnose MRSA infection nor to guide or monitor treatment for MRSA infections. Performed at [facility], [place] [address] [place], [place] [address]     Michel Bickers, [place] for [place] [facility] [phone] pager   [phone] cell 08/19/2018, 12:32 PM

## 2018-08-19 NOTE — Progress Notes (Signed)
Patient ID: Patrick Huber, male   DOB: [birth year], 47 y.o.   MRN: [id number]         [facility]  Date of Admission:  08/17/2018     ASSESSMENT: His confirmatory HIV antibody and viral load are still pending by like advanced HIV infection extremely unlikely.  I will go ahead and start therapy with Biktarvy prophylactic antibiotics for pneumocystis and candidal infection.  I appreciate the orthopedic evaluation.  PLAN: 1. Start Biktarvy 1 daily 2. Start prophylactic doses of trimethoprim sulfamethoxazole and fluconazole 3. Await pending tests  Principal Problem:   HIV test positive ([place]) Active Problems:   History of MRSA infection   Generalized weakness   Malnutrition of moderate degree   Diarrhea   Unintentional weight loss   Chronic arthralgias of knees and hips   Knee mass, right   Pancytopenia (HCC)   Cigarette smoker   GERD (gastroesophageal reflux disease)   Rash   Scheduled Meds: . bictegravir-emtricitabine-tenofovir AF  1 tablet Oral Daily  . enoxaparin (LOVENOX) injection  40 mg Subcutaneous Q24H  . feeding supplement (ENSURE ENLIVE)  237 mL Oral BID BM  . fluconazole  100 mg Oral Weekly  . sulfamethoxazole-trimethoprim  1 tablet Oral Daily   Continuous Infusions: . sodium chloride 75 mL/hr at 08/19/18 1047   PRN Meds:.acetaminophen, ondansetron (ZOFRAN) IV, oxyCODONE, traMADol   SUBJECTIVE: He is feeling a little bit better today.  He has not had any more diarrhea.  He feels like his joint pain improving but he still is pain in his elbows, right greater than left and right knee.  Review of Systems: Review of Systems  Constitutional: Positive for malaise/fatigue and weight loss. Negative for chills, diaphoresis and fever.  HENT: Negative for congestion and sore throat.   Eyes: Negative for blurred vision and double vision.  Respiratory: Negative for cough, sputum production and shortness of breath.   Cardiovascular:  Negative for chest pain.  Gastrointestinal: Negative for abdominal pain, diarrhea, nausea and vomiting.  Genitourinary: Negative for dysuria.  Musculoskeletal: Positive for joint pain.  Skin: Positive for rash.    Allergies  Allergen Reactions  . Meperidine Anaphylaxis    OBJECTIVE: Vitals:   08/18/18 0628 08/18/18 2245 08/19/18 0424 08/19/18 1438  BP: 112/65 124/79 122/78 117/63  Pulse: 77 94 100 96  Resp: 17 18 19 16   Temp: 98.4 F (36.9 C) 100.2 F (37.9 C) 99.9 F (37.7 C) 98.7 F (37.1 C)  TempSrc: Oral Oral Oral Oral  SpO2: 100% 94% 96% 95%  Weight:      Height:       Body mass index is 19.84 kg/m.  Physical Exam  Constitutional: He is oriented to person, place, and time.  He is sitting up in a chair.  He is in good spirits.  HENT:  Mouth/Throat: No oropharyngeal exudate.  Neck:  His neck remains stiff and he resists looking left.  Cardiovascular: Normal rate, regular rhythm and normal heart sounds.  Pulmonary/Chest: Effort normal and breath sounds normal.  Abdominal: Soft. He exhibits no distension. There is no tenderness.  Musculoskeletal:  As far persistent and swelling of his elbows.  Right knee is slightly warm.  Neurological: He is alert and oriented to person, place, and time.  Skin: Rash noted.  Psychiatric: He has a normal mood and affect.    Lab Results Lab Results  Component Value Date   WBC 2.5 (L) 08/19/2018   HGB 8.1 (L) 08/19/2018   HCT 26.4 (  L) 08/19/2018   MCV 84.3 08/19/2018   PLT 148 (L) 08/19/2018    Lab Results  Component Value Date   CREATININE 0.55 (L) 08/19/2018   BUN 15 08/18/2018   NA 137 08/18/2018   K 3.7 08/19/2018   CL 100 08/18/2018   CO2 27 08/18/2018    Lab Results  Component Value Date   ALT 58 (H) 08/17/2018   AST 62 (H) 08/17/2018   ALKPHOS 168 (H) 08/17/2018   BILITOT 1.0 08/17/2018     Microbiology: Recent Results (from the past 240 hour(s))  Blood culture (routine x 2)     Status: None  (Preliminary result)   Collection Time: 08/17/18  3:27 PM  Result Value Ref Range Status   Specimen Description   Final    BLOOD RIGHT HAND Performed at [facility], [place] [address], [place], [place] [address]    Special Requests   Final    BOTTLES DRAWN AEROBIC AND ANAEROBIC Blood Culture adequate volume Performed at [facility] [address] [place], [place] [address]    Culture   Final    NO GROWTH 2 DAYS Performed at [facility] [address], [place], [place] [address]    Report Status PENDING  Incomplete  Blood culture (routine x 2)     Status: None (Preliminary result)   Collection Time: 08/17/18  3:36 PM  Result Value Ref Range Status   Specimen Description   Final    BLOOD RIGHT HAND Performed at [facility] [address] [place], [place] [address]    Special Requests   Final    BOTTLES DRAWN AEROBIC AND ANAEROBIC Blood Culture results may not be optimal due to an excessive volume of blood received in culture bottles Performed at [facility] [address], [place], [place] [address]    Culture   Final    NO GROWTH 2 DAYS Performed at [facility] [address] [place], [place] [address]    Report Status PENDING  Incomplete  Urine culture     Status: Abnormal   Collection Time: 08/18/18  5:50 AM  Result Value Ref Range Status   Specimen Description   Final    URINE, CLEAN CATCH Performed at [facility], [place] [address] [place], [place] [address]    Special Requests   Final    NONE Performed at [facility], [place] [address], [place], [place] [address]    Culture (A)  Final    <10,000 COLONIES/mL INSIGNIFICANT GROWTH Performed at [facility] [address] [place], [place] [address]    Report Status 08/19/2018 FINAL  Final  MRSA PCR Screening     Status: None   Collection Time: 08/18/18  7:20 AM  Result Value  Ref Range Status   MRSA by PCR NEGATIVE NEGATIVE Final    Comment:        The GeneXpert MRSA Assay (FDA approved for NASAL specimens only), is one component of a comprehensive MRSA colonization surveillance program. It is not intended to diagnose MRSA infection nor to guide or monitor treatment for MRSA infections. Performed at [facility], [place] [address] [place],  [address]     Michel Bickers, [place] for [place] [facility] [phone] pager   [phone] cell 08/19/2018, 4:21 PM

## 2018-08-19 NOTE — Consult Note (Signed)
Reason for Consult: Polyarticular arthralgia/swelling in the setting of new diagnosis of HIV and recent right knee mass excision.  Referring Physician: Dr. Irene Pap  Patrick Huber is an 47 y.o. male.   HPI: Patient has a very complex history.  He reports his chronic joint pains began in June of this year after undergoing a biopsy for a suspicious mass in his right knee region.  After biopsy confirmed a benign lesion he underwent resection of this by Dr. Vincente Liberty at [facility].  2 weeks after the resection of the mass which was reportedly 6.2 cm in diameter and noncancerous Patrick Huber began experiencing some pain and swelling in the right knee as well as the left knee.  During his postoperative evaluations there were no concerning features however on follow-up visit on 07-16-2018 there was concern for knee effusion and therefore he was instructed to go to the emergency department for knee aspiration and cultures.  Patient did not attend this ER visit.  He ultimately continued to have pain in his knees that were progressing to his elbows and hips and ultimately went to the emergency department in [place] where aspirations of his bilateral knees were negative for infection.  He was recently admitted to [facility] with continued arthralgias, cachexia and generalized weakness.  Reports having diarrhea since June/July.  Since admission he was diagnosed as HIV positive and is planned to begin treatment soon.  Orthopedics has been consulted for his polyarticular arthralgias and swelling.  During my discussion with the patient he says that the swelling in his knees have gone down considerably over the past couple of weeks.  He states the pain occurs mostly after prolonged periods of immobility but after he gets up the knees seem to loosen up and become less painful.  He also describes some discomfort in his elbows with the right being worse than the left.  Again they are worse after prolonged immobilization but  seem to loosen up when he is up and about.  He denies significant pain in his ankles or wrists.  He denies any redness about his joints.  He does note some skin rashes on his scalp and other areas of his skin that are fairly recent.  Overall since admission he feels like his joint pain has been improving as has the swelling.  He is awaiting further HIV work-up.  Has been working with physical therapy.  Assessment/Plan: Patrick Huber certainly has a complex issue.  It is not entirely clear based on pathology reports and notes from [facility] as to the nature of the soft tissue mass that was removed.  It appears this was noncancerous and uninfected.  Recent aspiration from 8/26 from outside facility was negative for septic arthritis.  He does have some swelling of his left knee currently as well as his right elbow and I offered an aspiration for definitive diagnosis however he declined.  He states that his joint pain and swelling have been improving and therefore I feel like it is reasonable to follow along to ensure that he continues to improve.  If his joint pain does worsen or he has an increase in his swelling then I would strongly recommend re-aspiration.  The wound on his right knee does have some overlying scab and irritation but does not appear grossly infected.  His left knee and right elbow do have effusion but no overlying erythema.  X-rays of the right knee that were obtained on admission did not have any concerning features for bony  destruction.  I will plan to follow along while he is in the hospital.    Patrick Huber 08/19/2018, 2:57 PM    Dg Chest Port 1 View  Result Date: 08/19/2018 CLINICAL DATA:  Increased lung sounds in the basis EXAM: PORTABLE CHEST 1 VIEW COMPARISON:  04/27/2017 FINDINGS: Cardiac shadows within normal limits. The lungs are well aerated bilaterally. No focal infiltrate or sizable effusion is seen. No acute bony abnormality is noted. IMPRESSION: No active disease.  Electronically Signed   By: Inez Catalina M.D.   On: 08/19/2018 10:13   Dg Knee Right Port  Result Date: 08/17/2018 CLINICAL DATA:  Knee pain. EXAM: PORTABLE RIGHT KNEE - 1-2 VIEW COMPARISON:  07/27/2018. FINDINGS: No evidence of fracture or dislocation. Small suprapatellar bursa effusion. No signs of infection. Osteopenia. IMPRESSION: No evidence of fracture or dislocation. Small suprapatellar bursa effusion. Similar appearance to priors. Electronically Signed   By: Staci Righter M.D.   On: 08/17/2018 18:57   Review of Systems  Constitutional: Positive for chills, diaphoresis, malaise/fatigue and weight loss.  HENT: Negative.   Respiratory: Negative.   Cardiovascular: Positive for leg swelling.  Gastrointestinal: Positive for diarrhea.  Genitourinary: Negative.   Musculoskeletal:       Bilateral knee and elbow pain.  Skin: Positive for itching and rash.  Neurological: Positive for weakness.  Psychiatric/Behavioral: Negative.    Blood pressure 117/63, pulse 96, temperature 98.7 F (37.1 C), temperature source Oral, resp. rate 16, height _0  (1.854 m), weight 68.2 kg, SpO2 95 %. Physical Exam  Constitutional:  Cachectic appearing  HENT:  Rash and plaques on scalp  Eyes: Pupils are equal, round, and reactive to light.  Neck:  Some pain with attempted lateral motions  Cardiovascular: Normal rate.  Respiratory: Effort normal.  GI: Soft.  Musculoskeletal:  Patient has generalized pain and weakness with attempted joint motion about the shoulders and elbows.  He has swelling of the right elbow worse than the left elbow and some pain with attempted elbow motion.  He does have stiffness with his elbows and is unable to fully extend them bilaterally.  He can flex to about 90 degrees.  He has no tenderness palpation about his wrists.  He does have some evidence of clubbing of his fingers.  There is some redness to the tips of his fingers but no open wounds or signs of infection.  Bilateral  lower extremities show prior surgical incision about the medial right knee.  There is overlying scab formation and surrounding erythema but no sign for infection or wound dehiscence.  There is no notable knee effusion.  Some pain with attempted knee motion but is able to actively flex and extend his knee.  With full knee extension.  The left knee has swelling but no overlying erythema.  No tenderness to light palpation of the left knee but some pain with deep palpation about the patella.  He is able to actively flex and extend the knee but this does cause some discomfort.  Overall has weakness with muscle testing in bilateral lower and upper extremities.  Endorses intact sensation to light touch about the dorsal plantar surface of the feet.  Feet are warm and well-perfused.  Endorses intact sensation to light touch about the hand in the radial, ulnar, median nerve distributions.  Skin: Rash noted. There is erythema.  Psychiatric: He has a normal mood and affect.    History reviewed. No pertinent past medical history.  History reviewed. No pertinent surgical history.  History reviewed. No pertinent family history.  Social History:  reports that he has been smoking cigarettes. He has a 25.00 pack-year smoking history. He has never used smokeless tobacco. He reports that he drank alcohol. He reports that he does not use drugs.  Allergies:  Allergies  Allergen Reactions  . Meperidine Anaphylaxis    Medications: I have reviewed the patient's current medications.  Results for orders placed or performed during the hospital encounter of 08/17/18 (from the past 48 hour(s))  I-Stat CG4 Lactic Acid, ED     Status: None   Collection Time: 08/17/18  3:24 PM  Result Value Ref Range   Lactic Acid, Venous 0.72 0.5 - 1.9 mmol/L  CBC with Differential     Status: Abnormal   Collection Time: 08/17/18  3:27 PM  Result Value Ref Range   WBC 3.1 (L) 4.0 - 10.5 K/uL   RBC 3.65 (L) 4.22 - 5.81 MIL/uL    Hemoglobin 9.4 (L) 13.0 - 17.0 g/dL   HCT 30.1 (L) 39.0 - 52.0 %   MCV 82.5 78.0 - 100.0 fL   MCH 25.8 (L) 26.0 - 34.0 pg   MCHC 31.2 30.0 - 36.0 g/dL   RDW 15.4 11.5 - 15.5 %   Platelets 149 (L) 150 - 400 K/uL   Neutrophils Relative % 72 %   Neutro Abs 2.3 1.7 - 7.7 K/uL   Lymphocytes Relative 21 %   Lymphs Abs 0.6 (L) 0.7 - 4.0 K/uL   Monocytes Relative 7 %   Monocytes Absolute 0.2 0.1 - 1.0 K/uL   Eosinophils Relative 0 %   Eosinophils Absolute 0.0 0.0 - 0.7 K/uL   Basophils Relative 0 %   Basophils Absolute 0.0 0.0 - 0.1 K/uL    Comment: Performed at [facility], [place] [address] [place], [place] [address]  Comprehensive metabolic panel     Status: Abnormal   Collection Time: 08/17/18  3:27 PM  Result Value Ref Range   Sodium 137 135 - 145 mmol/L   Potassium 3.8 3.5 - 5.1 mmol/L   Chloride 98 98 - 111 mmol/L   CO2 29 22 - 32 mmol/L   Glucose, Bld 110 (H) 70 - 99 mg/dL   BUN 15 6 - 20 mg/dL   Creatinine, Ser 0.72 0.61 - 1.24 mg/dL   Calcium 8.4 (L) 8.9 - 10.3 mg/dL   Total Protein 8.7 (H) 6.5 - 8.1 g/dL   Albumin 2.2 (L) 3.5 - 5.0 g/dL   AST 62 (H) 15 - 41 U/L   ALT 58 (H) 0 - 44 U/L   Alkaline Phosphatase 168 (H) 38 - 126 U/L   Total Bilirubin 1.0 0.3 - 1.2 mg/dL   GFR calc non Af Amer >60 >60 mL/min   GFR calc Af Amer >60 >60 mL/min    Comment: (NOTE) The eGFR has been calculated using the CKD EPI equation. This calculation has not been validated in all clinical situations. eGFR's persistently <60 mL/min signify possible Chronic Kidney Disease.    Anion gap 10 5 - 15    Comment: Performed at [facility], [place] [address] [place], [place] [address]  Lipase, blood     Status: None   Collection Time: 08/17/18  3:27 PM  Result Value Ref Range   Lipase 24 11 - 51 U/L    Comment: Performed at [facility], [place] [address] [place], [place] [address]  Blood culture (routine x 2)     Status: None (  Preliminary  result)   Collection Time: 08/17/18  3:27 PM  Result Value Ref Range   Specimen Description      BLOOD RIGHT HAND Performed at [facility], [place] [address], [place], [place] [address]    Special Requests      BOTTLES DRAWN AEROBIC AND ANAEROBIC Blood Culture adequate volume Performed at [place] [address] [place], [place] [address]    Culture      NO GROWTH 2 DAYS Performed at [facility], [place] [address] [place], [place] [address]    Report Status PENDING   Rapid HIV screen (HIV 1/2 Ab+Ag)     Status: Abnormal   Collection Time: 08/17/18  3:27 PM  Result Value Ref Range   HIV-1 P24 Antigen - HIV24 NON REACTIVE NON REACTIVE   HIV 1/2 Antibodies Reactive (A) NON REACTIVE    Comment: REPEATED TO VERIFY   Interpretation (HIV Ag Ab)      A reactive test result means that HIV 1 or HIV 2 antibodies have been detected in the specimen. The test result is interpreted as Preliminary Positive for HIV 1 and/or HIV 2 antibodies.    Comment: SENT FOR CONFIRMATION RESULT CALLED TO, READ BACK BY AND VERIFIED WITH: C.MOORE AT 1727 ON 08/17/18 BY N.THOMPSON Performed at [facility], [place] [address], [place], [place] [address]   TSH     Status: None   Collection Time: 08/17/18  3:29 PM  Result Value Ref Range   TSH 3.047 0.350 - 4.500 uIU/mL    Comment: Performed by a 3rd Generation assay with a functional sensitivity of <=0.01 uIU/mL. Performed at [facility], [place] [address], [place], [place] [address]   Blood culture (routine x 2)     Status: None (Preliminary result)   Collection Time: 08/17/18  3:36 PM  Result Value Ref Range   Specimen Description      BLOOD RIGHT HAND Performed at [place] [address] [place], [place] [address]    Special Requests      BOTTLES DRAWN AEROBIC AND ANAEROBIC Blood Culture results may not be optimal due to an excessive volume of blood  received in culture bottles Performed at [place] [address] [place], [place] [address]    Culture      NO GROWTH 2 DAYS Performed at [facility], [place] [address], [place], [place] [address]    Report Status PENDING   T4, free     Status: None   Collection Time: 08/17/18  7:09 PM  Result Value Ref Range   Free T4 1.21 0.82 - 1.77 ng/dL    Comment: (NOTE) Biotin ingestion may interfere with free T4 tests. If the results are inconsistent with the TSH level, previous test results, or the clinical presentation, then consider biotin interference. If needed, order repeat testing after stopping biotin. Performed at [facility], [place] [address] [place], [place] [address]   C-reactive protein     Status: Abnormal   Collection Time: 08/17/18  7:09 PM  Result Value Ref Range   CRP 19.3 (H) <1.0 mg/dL    Comment: Performed at [facility], [place] [address] [place], [place] [address]  Sedimentation rate     Status: Abnormal   Collection Time: 08/17/18  7:15 PM  Result Value Ref Range   Sed Rate >140 (H) 0 - 16 mm/hr    Comment:  Performed at [facility], [place] [address] [place], [place] [address]  CK     Status: Abnormal   Collection Time: 08/17/18  7:15 PM  Result Value Ref Range   Total CK 21 (L) 49 - 397 U/L    Comment: Performed at [facility], [place] [address], [place], [place] [address]  Magnesium     Status: None   Collection Time: 08/17/18  7:15 PM  Result Value Ref Range   Magnesium 2.1 1.7 - 2.4 mg/dL    Comment: Performed at [facility], [place] [address], [place], [place] [address]  CBC     Status: Abnormal   Collection Time: 08/18/18  3:37 AM  Result Value Ref Range   WBC 2.6 (L) 4.0 - 10.5 K/uL   RBC 3.24 (L) 4.22 - 5.81 MIL/uL   Hemoglobin 8.3 (L) 13.0 - 17.0 g/dL   HCT 27.1 (L) 39.0 - 52.0 %   MCV 83.6 78.0 - 100.0 fL   MCH 25.6 (L) 26.0 - 34.0 pg    MCHC 30.6 30.0 - 36.0 g/dL   RDW 15.7 (H) 11.5 - 15.5 %   Platelets 161 150 - 400 K/uL    Comment: Performed at [facility], [place] [address], [place], [place] [address]  Basic metabolic panel     Status: Abnormal   Collection Time: 08/18/18  3:37 AM  Result Value Ref Range   Sodium 137 135 - 145 mmol/L   Potassium 3.4 (L) 3.5 - 5.1 mmol/L   Chloride 100 98 - 111 mmol/L   CO2 27 22 - 32 mmol/L   Glucose, Bld 108 (H) 70 - 99 mg/dL   BUN 15 6 - 20 mg/dL   Creatinine, Ser 0.61 0.61 - 1.24 mg/dL   Calcium 8.1 (L) 8.9 - 10.3 mg/dL   GFR calc non Af Amer >60 >60 mL/min   GFR calc Af Amer >60 >60 mL/min    Comment: (NOTE) The eGFR has been calculated using the CKD EPI equation. This calculation has not been validated in all clinical situations. eGFR's persistently <60 mL/min signify possible Chronic Kidney Disease.    Anion gap 10 5 - 15    Comment: Performed at [facility], [place] [address], [place], [place] [address]  Urinalysis, Routine w reflex microscopic     Status: Abnormal   Collection Time: 08/18/18  5:50 AM  Result Value Ref Range   Color, Urine AMBER (A) YELLOW    Comment: BIOCHEMICALS MAY BE AFFECTED BY COLOR   APPearance HAZY (A) CLEAR   Specific Gravity, Urine 1.029 1.005 - 1.030   pH 5.0 5.0 - 8.0   Glucose, UA NEGATIVE NEGATIVE mg/dL   Hgb urine dipstick SMALL (A) NEGATIVE   Bilirubin Urine NEGATIVE NEGATIVE   Ketones, ur NEGATIVE NEGATIVE mg/dL   Protein, ur 100 (A) NEGATIVE mg/dL   Nitrite NEGATIVE NEGATIVE   Leukocytes, UA NEGATIVE NEGATIVE   RBC / HPF 0-5 0 - 5 RBC/hpf   WBC, UA 0-5 0 - 5 WBC/hpf   Bacteria, UA NONE SEEN NONE SEEN   Squamous Epithelial / LPF 0-5 0 - 5   Mucus PRESENT     Comment: Performed at [facility], [place] [address] [place],  [address]  Urine culture     Status: Abnormal   Collection Time: 08/18/18  5:50 AM  Result Value Ref Range   Specimen Description      URINE, CLEAN  CATCH  Performed at [facility], [place] [address] [place], [place] [address]    Special Requests      NONE Performed at [facility], [place] [address], [place], [place] [address]    Culture (A)     <10,000 COLONIES/mL INSIGNIFICANT GROWTH Performed at [place] [address], [place], [place] [address]    Report Status 08/19/2018 FINAL   T-helper cells (CD4) count (not at [facility])     Status: Abnormal   Collection Time: 08/18/18  6:02 AM  Result Value Ref Range   CD4 T Cell Abs 20 (L) 400 - 2,700 /uL   CD4 % Helper T Cell 3 (L) 33 - 55 %    Comment: Performed at [facility], [place] [address] [place], [place] [address]  MRSA PCR Screening     Status: None   Collection Time: 08/18/18  7:20 AM  Result Value Ref Range   MRSA by PCR NEGATIVE NEGATIVE    Comment:        The GeneXpert MRSA Assay (FDA approved for NASAL specimens only), is one component of a comprehensive MRSA colonization surveillance program. It is not intended to diagnose MRSA infection nor to guide or monitor treatment for MRSA infections. Performed at [facility], [place] [address], [place], [place] [address]   Hepatitis B surface antibody     Status: Abnormal   Collection Time: 08/18/18  5:44 PM  Result Value Ref Range   Hepatitis B-Post <3.1 (L) Immunity>9.9 mIU/mL    Comment: (NOTE)  Status of Immunity                     Anti-HBs Level  ------------------                     -------------- Inconsistent with Immunity                   0.0 - 9.9 Consistent with Immunity                          >9.9 Performed At: [facility] [place], [place] [id number] Rush Farmer MD [id number]   Hepatitis B surface antigen     Status: None   Collection Time: 08/18/18  5:44 PM  Result Value Ref Range   Hepatitis B Surface Ag Negative Negative    Comment: (NOTE) Performed At: [facility] [address] [place], [place] [id number] Rush Farmer MD [id number]   Hepatitis c antibody (reflex)     Status: None   Collection Time: 08/18/18  5:44 PM  Result Value Ref Range   HCV Ab 0.3 0.0 - 0.9 s/co ratio    Comment: (NOTE) Performed At: [facility] [address] [place], [place] [id number] Rush Farmer MD [id number]   HCV Comment:     Status: None   Collection Time: 08/18/18  5:44 PM  Result Value Ref Range   Comment: Comment     Comment: (NOTE) Non reactive HCV antibody screen is consistent with no HCV infection, unless recent infection is suspected or other evidence exists to indicate HCV infection. Performed At:  [facility] [place], [place] [id number] Rush Farmer MD [id number]   Creatinine, serum     Status: Abnormal   Collection Time: 08/19/18  3:43 AM  Result Value Ref Range   Creatinine, Ser 0.55 (L) 0.61 - 1.24 mg/dL  GFR calc non Af Amer >60 >60 mL/min   GFR calc Af Amer >60 >60 mL/min    Comment: (NOTE) The eGFR has been calculated using the CKD EPI equation. This calculation has not been validated in all clinical situations. eGFR's persistently <60 mL/min signify possible Chronic Kidney Disease. Performed at [facility], [place] [address] [place], [place] [address]   CBC     Status: Abnormal   Collection Time: 08/19/18  3:43 AM  Result Value Ref Range   WBC 2.5 (L) 4.0 - 10.5 K/uL   RBC 3.13 (L) 4.22 - 5.81 MIL/uL   Hemoglobin 8.1 (L) 13.0 - 17.0 g/dL   HCT 26.4 (L) 39.0 - 52.0 %   MCV 84.3 78.0 - 100.0 fL   MCH 25.9 (L) 26.0 - 34.0 pg   MCHC 30.7 30.0 - 36.0 g/dL   RDW 16.1 (H) 11.5 - 15.5 %   Platelets 148 (L) 150 - 400 K/uL    Comment: Performed at [facility], [place] [address] [place], [place] [address]  Potassium     Status: None   Collection Time: 08/19/18  3:43 AM  Result Value Ref Range   Potassium 3.7 3.5 - 5.1 mmol/L    Comment: Performed at [facility], [place] [address] [place], [place] [address]

## 2018-08-19 NOTE — Evaluation (Addendum)
Physical Therapy Evaluation Patient Details Name: Patrick Huber MRN: [id number] DOB: 11-21-1971 Today's Date: 08/19/2018   History of Present Illness  47 y.o. male with medical history significant for right knee mass status post resection in July at [facility], tobacco use disorder who presented from home to [facility] ED due to gradually worsening generalized weakness and severe right knee pain of 3 weeks duration.  Reports intermittent chills and subjective fevers, new skin lesions affecting his fingers, toes and scalp.  Significant family history of psoriasis and eczema.  In July had a mass on his right knee which was removed at [facility].  States since then about 3 weeks ago started having diffused joint pain associated with generalized weakness. HIV screening positive, further testing pending.   Clinical Impression  Pt admitted with above diagnosis. Pt currently with functional limitations due to the deficits listed below (see PT Problem List). +2 assist for bed to recliner transfer. Pt has BUE/LE weakness and pain which significantly limit activity tolerance. He lives alone. At present, he would require SNF level of care. Pt will benefit from skilled PT to increase their independence and safety with mobility to allow discharge to the venue listed below.       Follow Up Recommendations SNF;Supervision/Assistance - 24 hour (depending on progress)    Equipment Recommendations  Other (comment)(to be determined, depending on progress)    Recommendations for Other Services OT consult     Precautions / Restrictions Precautions Precautions: Fall Precaution Comments: 4-5 falls since JUly; hadn't walked for 3 days PTA Restrictions Weight Bearing Restrictions: No      Mobility  Bed Mobility Overal bed mobility: Needs Assistance Bed Mobility: Supine to Sit     Supine to sit: Mod assist;+2 for safety/equipment     General bed mobility comments: assist to raise trunk, pivot hips to edge of bed and to  advance LEs  Transfers Overall transfer level: Needs assistance Equipment used: Rolling walker (2 wheeled) Transfers: Sit to/from [facility] Sit to Stand: +2 safety/equipment;Mod assist;From elevated surface;+2 physical assistance Stand pivot transfers: Min assist;+2 safety/equipment       General transfer comment: assist to rise from elevated bed; pt able to take a few pivotal steps from bed to recliner with RW, pt had narrow BOS, able to widen BOS with VCs  Ambulation/Gait                Stairs            Wheelchair Mobility    Modified Rankin (Stroke Patients Only)       Balance Overall balance assessment: Needs assistance   Sitting balance-Leahy Scale: Fair     Standing balance support: Bilateral upper extremity supported Standing balance-Leahy Scale: Poor                               Pertinent Vitals/Pain Pain Assessment: 0-10 Pain Score: 7  Pain Location: BLEs, B elbows Pain Descriptors / Indicators: Sore Pain Intervention(s): Limited activity within patient's tolerance;Monitored during session;RN gave pain meds during session    [place] expects to be discharged to:: Private residence Living Arrangements: Alone Available Help at Discharge: Friend(s)(ex wife stops by daily, she works and is out of town until [place] or [place])   Home Access: Stairs to enter   Technical brewer of Steps: 8 steps in front, 0 in back Home Layout: One level Home Equipment: Crutches;Walker - 2 wheels  Prior Function Level of Independence: Independent         Comments: 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 device, drives tow truck     Hand Dominance        Extremity/Trunk Assessment   Upper Extremity Assessment Upper Extremity Assessment: RUE deficits/detail;LUE deficits/detail;Generalized weakness RUE Deficits / Details: elbow extension painful, lacks~30* elbow extension AROM  LUE Deficits / Details: elbow  extension painful, lacks~30* elbow extension AROM     Lower Extremity Assessment Lower Extremity Assessment: Generalized weakness(B knee ext AROM -25*, sensation intact to light touch B feet but pt reports LEs feel "heavy and numb")    Cervical / Trunk Assessment Cervical / Trunk Assessment: Normal  Communication   Communication: No difficulties  Cognition Arousal/Alertness: Awake/alert Behavior During Therapy: WFL for tasks assessed/performed Overall Cognitive Status: Within Functional Limits for tasks assessed                                        General Comments      Exercises     Assessment/Plan    PT Assessment Patient needs continued PT services  PT Problem List Decreased strength;Decreased balance;Decreased range of motion;Decreased mobility;Decreased activity tolerance;Pain       PT Treatment Interventions DME instruction;Gait training;Functional mobility training;Therapeutic activities;Therapeutic exercise;Balance training;Patient/family education    PT Goals (Current goals can be found in the Care Plan section)  Acute Rehab PT Goals Patient Stated Goal: ride his motorcycle PT Goal Formulation: With patient Time For Goal Achievement: 09/02/18 Potential to Achieve Goals: Fair    Frequency Min 3X/week   Barriers to discharge Decreased caregiver support lives alone, ex wife can assist at times but she works    Co-evaluation               AM-PAC PT "6 Clicks" Daily Activity  Outcome Measure Difficulty turning over in bed (including adjusting bedclothes, sheets and blankets)?: Unable Difficulty moving from lying on back to sitting on the side of the bed? : Unable Difficulty sitting down on and standing up from a chair with arms (e.g., wheelchair, bedside commode, etc,.)?: Unable Help needed moving to and from a bed to chair (including a wheelchair)?: A Lot Help needed walking in hospital room?: Total Help needed climbing 3-5 steps with  a railing? : Total 6 Click Score: 7    End of Session Equipment Utilized During Treatment: Gait belt Activity Tolerance: Patient limited by fatigue;Patient limited by pain Patient left: in chair;with call bell/phone within reach Nurse Communication: Mobility status PT Visit Diagnosis: Difficulty in walking, not elsewhere classified (R26.2);Repeated falls (R29.6);History of falling (Z91.81);Muscle weakness (generalized) (M62.81);Pain Pain - Right/Left: Right Pain - part of body: Leg    Time: 1441-1510 PT Time Calculation (min) (ACUTE ONLY): 29 min   Charges:   PT Evaluation $PT Eval Moderate Complexity: 1 Mod         Philomena Doheny PT 08/19/2018  Acute Rehabilitation Services Pager [phone] Office [phone]

## 2018-08-19 NOTE — Progress Notes (Signed)
PROGRESS NOTE  [name] YKD:[id number] DOB: 1971-09-05 DOA: 08/17/2018 PCP: Patient, No Pcp Per  HPI/Recap of past 24 hours: Patrick Huber is a 47 y.o. male with medical history significant for right knee mass status post resection in July at [facility], tobacco use disorder who presented from home to [facility] ED due to gradually worsening generalized weakness and severe right knee pain of 3 weeks duration.  Reports intermittent chills and subjective fevers, new skin lesions affecting his fingers, toes and scalp.  Significant family history of psoriasis and eczema.  In July had a mass on his right knee which was removed at [facility].  States since then about 3 weeks ago started having diffused joint pain associated with generalized weakness.  Went to [facility] and was asked to return to his orthopedic surgeon.  States he was unable to make the appointment due to high co-pay.  Also reports diarrhea after eating.  As a result has abstained from eating for 4 days.  08/18/18: Patient seen and examined at his bedside.  He reports diffused severe joint pain rash affecting his fingers toes and scalp.  Increased pain medications with some relief.  HIV screening test positive.  Ordered confirmatory tests viral load and CD4.  ID also consulted.  08/19/2018: Patient seen and examined at bedside.  Diffused joint pain mildly improved with pain medications.  Still feels very weak.  PT to assess.  Encouraged increase p.o. protein calorie intake.  CD4 percentage 3.  Infectious disease following.  Highly appreciated. Will be started on HAART today.  Assessment/Plan: Principal Problem:   HIV test positive (HCC) Active Problems:   Generalized weakness   Malnutrition of moderate degree   Diarrhea   Unintentional weight loss   Chronic arthralgias of knees and hips   Knee mass, right   Pancytopenia (HCC)   Cigarette smoker   GERD (gastroesophageal reflux disease)   Rash   History of MRSA infection  Generalized  weakness/physical debility most likely secondary to HIV infection ESR greater than 140 and CRP of 18 HIV 1/2 antibioties reactive-reports 2 lifetime male sexual partners; his separated wife and his girlfriend. CD4 percentage 3/CD4 count 20 ID following and will start HAART today HIV viral load is still pending  Newly diagnosed HIV Management as stated above CD4 count less than 50 Defer to infectious disease to start prophylaxis  Severe diffuse joint pain with effusion affecting elbows and knees bilaterally Orthopedic surgery consulted to assess Continue supportive care  Physical debility/ambulatory dysfunction PT to assess Fall precautions  Resolved AKI Baseline creatinine 0.6 Continue to avoid nephrotoxic agents/dehydration/hypotension Monitor urine output  Resolved hypokalemia after repletion  Status post right knee mass resection Procedure done at [facility] in July 2019 X-ray of right knee with small suprapatellar bursa effusion  No sign of fracture or dislocation or infection  Persistent leukopenia and normocytic anemia most likely secondary to HIV infection WBC 2.5 from 2.6 and hemoglobin 8.1 from 8.3 Will be started on treatment for HIV today Continue to monitor  Severe protein calorie malnutrition Albumin 2.2 BMI 19 Continue oral supplementation Dietary consult to address nutritional needs  Tobacco use disorder Tobacco cessation counseling done at bedside Nicotine patch  Skin lesions suspect secondary to HIV infection Appearance of severe psoriasis particularly on the scalp Patient has not seen a dermatologist  Ambulatory dysfunction secondary to severe knee joint pain bilaterally Fall precautions PT to assess in the morning  Risks: Patient is a high risk for decompensation due to newly  diagnosed HIV infection, severely elevated inflammatory markers and severe diffuse joint pain.   Code Status: Full code  Family Communication: None at  bedside  Disposition Plan: Home in 1 to 2 days or when infectious disease signs off.   Consultants:  Infectious disease  Procedures:  None  Antimicrobials:  None  DVT prophylaxis: Subcu Lovenox   Objective: Vitals:   08/17/18 2143 08/18/18 0628 08/18/18 2245 08/19/18 0424  BP: 110/73 112/65 124/79 122/78  Pulse: 84 77 94 100  Resp: 18 17 18 19   Temp: 98.7 F (37.1 C) 98.4 F (36.9 C) 100.2 F (37.9 C) 99.9 F (37.7 C)  TempSrc: Oral Oral Oral Oral  SpO2: 100% 100% 94% 96%  Weight: 68.2 kg     Height: 6' 1"  (1.854 m)       Intake/Output Summary (Last 24 hours) at 08/19/2018 1225 Last data filed at 08/19/2018 0930 Gross per 24 hour  Intake 2224.53 ml  Output 325 ml  Net 1899.53 ml   Filed Weights   08/17/18 1625 08/17/18 2143  Weight: 68 kg 68.2 kg    Exam:  . General: 47 y.o. year-old male frail, in no acute distress.  Alert oriented x3. . Cardiovascular: Regular rate and rhythm with no rubs or gallops.  No JVD or thyromegaly noted.   Marland Kitchen Respiratory: Clear to auscultation with no wheezes or rales. Good inspiratory effort. . Abdomen: Soft nontender nondistended with normal bowel sounds x4 quadrants. . Musculoskeletal: Knees and elbows appear inflamed with effusion bilaterally. . Skin: Severe psoriatic appearing lesions affecting scalp toes and fingers. Marland Kitchen Psychiatry: Mood is appropriate for condition and setting   Data Reviewed: CBC: Recent Labs  Lab 08/17/18 1527 08/18/18 0337 08/19/18 0343  WBC 3.1* 2.6* 2.5*  NEUTROABS 2.3  --   --   HGB 9.4* 8.3* 8.1*  HCT 30.1* 27.1* 26.4*  MCV 82.5 83.6 84.3  PLT 149* 161 476*   Basic Metabolic Panel: Recent Labs  Lab 08/17/18 1527 08/17/18 1915 08/18/18 0337 08/19/18 0343  NA 137  --  137  --   K 3.8  --  3.4* 3.7  CL 98  --  100  --   CO2 29  --  27  --   GLUCOSE 110*  --  108*  --   BUN 15  --  15  --   CREATININE 0.72  --  0.61 0.55*  CALCIUM 8.4*  --  8.1*  --   MG  --  2.1  --   --     GFR: Estimated Creatinine Clearance: 111.3 mL/min (A) (by C-G formula based on SCr of 0.55 mg/dL (L)). Liver Function Tests: Recent Labs  Lab 08/17/18 1527  AST 62*  ALT 58*  ALKPHOS 168*  BILITOT 1.0  PROT 8.7*  ALBUMIN 2.2*   Recent Labs  Lab 08/17/18 1527  LIPASE 24   No results for input(s): AMMONIA in the last 168 hours. Coagulation Profile: No results for input(s): INR, PROTIME in the last 168 hours. Cardiac Enzymes: Recent Labs  Lab 08/17/18 1915  CKTOTAL 21*   BNP (last 3 results) No results for input(s): PROBNP in the last 8760 hours. HbA1C: No results for input(s): HGBA1C in the last 72 hours. CBG: No results for input(s): GLUCAP in the last 168 hours. Lipid Profile: No results for input(s): CHOL, HDL, LDLCALC, TRIG, CHOLHDL, LDLDIRECT in the last 72 hours. Thyroid Function Tests: Recent Labs    08/17/18 1529 08/17/18 1909  TSH 3.047  --  FREET4  --  1.21   Anemia Panel: No results for input(s): VITAMINB12, FOLATE, FERRITIN, TIBC, IRON, RETICCTPCT in the last 72 hours. Urine analysis:    Component Value Date/Time   COLORURINE AMBER (A) 08/18/2018 0550   APPEARANCEUR HAZY (A) 08/18/2018 0550   LABSPEC 1.029 08/18/2018 0550   PHURINE 5.0 08/18/2018 0550   GLUCOSEU NEGATIVE 08/18/2018 0550   HGBUR SMALL (A) 08/18/2018 0550   BILIRUBINUR NEGATIVE 08/18/2018 0550   KETONESUR NEGATIVE 08/18/2018 0550   PROTEINUR 100 (A) 08/18/2018 0550   NITRITE NEGATIVE 08/18/2018 0550   LEUKOCYTESUR NEGATIVE 08/18/2018 0550   Sepsis Labs: @LABRCNTIP (procalcitonin:4,lacticidven:4)  ) Recent Results (from the past 240 hour(s))  Blood culture (routine x 2)     Status: None (Preliminary result)   Collection Time: 08/17/18  3:27 PM  Result Value Ref Range Status   Specimen Description   Final    BLOOD RIGHT HAND Performed at [facility], [place] [address] [place], [place] [address]    Special Requests   Final    BOTTLES DRAWN AEROBIC AND  ANAEROBIC Blood Culture adequate volume Performed at [place] [address] [place], [place] [address]    Culture   Final    NO GROWTH 2 DAYS Performed at [place] [address], [place], [place] [address]    Report Status PENDING  Incomplete  Blood culture (routine x 2)     Status: None (Preliminary result)   Collection Time: 08/17/18  3:36 PM  Result Value Ref Range Status   Specimen Description   Final    BLOOD RIGHT HAND Performed at [facility] [address], [place], [place] [address]    Special Requests   Final    BOTTLES DRAWN AEROBIC AND ANAEROBIC Blood Culture results may not be optimal due to an excessive volume of blood received in culture bottles Performed at [facility] [address], [place], [place] [address]    Culture   Final    NO GROWTH 2 DAYS Performed at [facility] [address], [place], [place] [address]    Report Status PENDING  Incomplete  Urine culture     Status: Abnormal   Collection Time: 08/18/18  5:50 AM  Result Value Ref Range Status   Specimen Description   Final    URINE, CLEAN CATCH Performed at [facility], [place] [address], [place], [place] [address]    Special Requests   Final    NONE Performed at [facility], [place] [address] [place], [place] [address]    Culture (A)  Final    <10,000 COLONIES/mL INSIGNIFICANT GROWTH Performed at [facility] [address] [place], [place] [address]    Report Status 08/19/2018 FINAL  Final  MRSA PCR Screening     Status: None   Collection Time: 08/18/18  7:20 AM  Result Value Ref Range Status   MRSA by PCR NEGATIVE NEGATIVE Final    Comment:        The GeneXpert MRSA Assay (FDA approved for NASAL specimens only), is one component of a comprehensive MRSA colonization surveillance program. It is not intended to diagnose MRSA infection nor to guide or monitor  treatment for MRSA infections. Performed at [facility], [place] [address], [place],  [address]       Studies: Dg Chest Port 1 View  Result Date: 08/19/2018 CLINICAL DATA:  Increased lung  sounds in the basis EXAM: PORTABLE CHEST 1 VIEW COMPARISON:  04/27/2017 FINDINGS: Cardiac shadows within normal limits. The lungs are well aerated bilaterally. No focal infiltrate or sizable effusion is seen. No acute bony abnormality is noted. IMPRESSION: No active disease. Electronically Signed   By: Inez Catalina M.D.   On: 08/19/2018 10:13    Scheduled Meds: . enoxaparin (LOVENOX) injection  40 mg Subcutaneous Q24H  . feeding supplement (ENSURE ENLIVE)  237 mL Oral BID BM    Continuous Infusions: . sodium chloride 75 mL/hr at 08/19/18 1047     LOS: 2 days     Kayleen Memos, MD [facility] Pager [phone]  If 7PM-7AM, please contact night-coverage [email or web address] Password [facility] 08/19/2018, 12:25 PM

## 2018-08-20 ENCOUNTER — Encounter (HOSPITAL_COMMUNITY): Payer: Self-pay | Admitting: *Deleted

## 2018-08-20 ENCOUNTER — Telehealth: Payer: Self-pay | Admitting: Pharmacist

## 2018-08-20 DIAGNOSIS — B20 Human immunodeficiency virus [HIV] disease: Secondary | ICD-10-CM

## 2018-08-20 DIAGNOSIS — A528 Late syphilis, latent: Secondary | ICD-10-CM | POA: Diagnosis present

## 2018-08-20 LAB — CBC
HCT: 25.2 % — ABNORMAL LOW (ref 39.0–52.0)
Hemoglobin: 8 g/dL — ABNORMAL LOW (ref 13.0–17.0)
MCH: 26.4 pg (ref 26.0–34.0)
MCHC: 31.7 g/dL (ref 30.0–36.0)
MCV: 83.2 fL (ref 78.0–100.0)
PLATELETS: 140 10*3/uL — AB (ref 150–400)
RBC: 3.03 MIL/uL — ABNORMAL LOW (ref 4.22–5.81)
RDW: 15.9 % — AB (ref 11.5–15.5)
WBC: 2.3 10*3/uL — AB (ref 4.0–10.5)

## 2018-08-20 LAB — HEPATITIS PANEL, ACUTE
HCV Ab: 0.2 s/co ratio (ref 0.0–0.9)
HEP B C IGM: NEGATIVE
Hep A IgM: NEGATIVE
Hepatitis B Surface Ag: NEGATIVE

## 2018-08-20 LAB — HIV-1 RNA QUANT-NO REFLEX-BLD
HIV 1 RNA Quant: 66800 copies/mL
LOG10 HIV-1 RNA: 4.825 log10copy/mL

## 2018-08-20 LAB — CREATININE, SERUM
CREATININE: 0.69 mg/dL (ref 0.61–1.24)
GFR calc non Af Amer: >60 mL/min (ref >60)

## 2018-08-20 MED ORDER — BICTEGRAVIR-EMTRICITAB-TENOFOV 50-200-25 MG PO TABS: 1.0000 | ORAL_TABLET | Freq: Every day | ORAL | 2 refills | Status: DC

## 2018-08-20 MED ORDER — PENICILLIN G BENZATHINE 1200000 UNIT/2ML IM SUSP
2.4000 10*6.[IU] | INTRAMUSCULAR | Status: DC
  Administered 2018-08-20: 2.4 10*6.[IU] via INTRAMUSCULAR
  Filled 2018-08-20: qty 4

## 2018-08-20 MED ORDER — PENICILLIN G BENZATHINE 1200000 UNIT/2ML IM SUSP: 1.2000 10*6.[IU] | INTRAMUSCULAR | Status: DC

## 2018-08-20 MED FILL — BIKTARVY 50-200-25 MG TABS: 50-200-25 | 30 days supply | Qty: 30 | Fill #0

## 2018-08-20 NOTE — Progress Notes (Signed)
PROGRESS NOTE  Patrick Huber NAT:[id number] DOB: 22-Jun-1971 DOA: 08/17/2018 PCP: Patient, No Pcp Per  HPI/Recap of past 24 hours: Patrick Huber is a 47 y.o. male with medical history significant for right knee mass status post resection in July at [facility], tobacco use disorder who presented from home to [facility] ED due to gradually worsening generalized weakness and severe right knee pain of 3 weeks duration.  Reports intermittent chills and subjective fevers, new skin lesions affecting his fingers, toes and scalp.  Significant family history of psoriasis and eczema.  In July had a mass on his right knee which was removed at [facility].  States since then about 3 weeks ago started having diffused joint pain associated with generalized weakness.  Went to [facility] and was asked to return to his orthopedic surgeon.  States he was unable to make the appointment due to high co-pay.  Also reports diarrhea after eating.  As a result has abstained from eating for 4 days.  08/18/18: Patient seen and examined at his bedside.  He reports diffused severe joint pain rash affecting his fingers toes and scalp.  Increased pain medications with some relief.  HIV screening test positive.  Ordered confirmatory tests viral load and CD4.  ID also consulted.  08/19/2018: Patient seen and examined at bedside.  Diffused joint pain mildly improved with pain medications.  Still feels very weak.  PT to assess.  Encouraged increase p.o. protein calorie intake.  CD4 percentage 3.  Infectious disease following.  Highly appreciated. Will be started on HAART today.  08/19/2018: Patient seen and examined at his bedside.  No acute events overnight.  Reports his joint pains are improved with pain medications.  Started Biktarvy yesterday.  Case manager consulted to assist with medications and discharge needs.  Assessment/Plan: Principal Problem:   HIV disease ([place]) Active Problems:   Generalized weakness   Malnutrition of moderate  degree   Diarrhea   Unintentional weight loss   Polyarthralgia   Knee mass, right   Pancytopenia (HCC)   Cigarette smoker   GERD (gastroesophageal reflux disease)   Rash   History of MRSA infection   Syphilis  Generalized weakness/physical debility most likely secondary to HIV infection ESR greater than 140 and CRP of 18 HIV 1/2 antibioties reactive-reports 2 lifetime male sexual partners; his separated wife and his girlfriend. 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 Continue IM penicillin 2,400,000 units weekly x3 doses Patient states he will inform his wife of his newly diagnosed HIV and syphilis status  Improving severe diffuse joint pain with effusion affecting elbows and knees bilaterally Orthopedic surgery consulted to assess Continue supportive care  Physical debility/ambulatory dysfunction PT recommends SNF Fall precautions Social worker consulted to assist in placement  Resolved AKI Baseline creatinine 0.6 Continue to avoid nephrotoxic agents/dehydration/hypotension Monitor urine output  Resolved hypokalemia after repletion  Status post right knee mass resection Procedure done at [facility] in July 2019 X-ray of right knee with small suprapatellar bursa effusion  No sign of fracture or dislocation or infection  Persistent leukopenia and normocytic anemia most likely secondary to HIV infection WBC 2.5 from 2.6 and hemoglobin 8.1 from 8.3 Will be started on treatment for HIV today Continue  to monitor  Severe protein calorie malnutrition Albumin 2.2 BMI 19 Continue oral supplementation Dietary consult to address nutritional  needs  Tobacco use disorder Tobacco cessation counseling done at bedside Nicotine patch  Skin lesions suspect secondary to HIV infection Appearance of severe psoriasis particularly on the scalp Patient has not seen a dermatologist  Ambulatory dysfunction secondary to severe knee joint pain bilaterally Fall precautions PT to assess in the morning  Risks: Patient is a high risk for decompensation due to newly diagnosed HIV infection, severely elevated inflammatory markers and severe diffuse joint pain.   Code Status: Full code  Family Communication: None at bedside  Disposition Plan: Home in 1 to 2 days or when infectious disease signs off.   Consultants:  Infectious disease  Procedures:  None  Antimicrobials:  Bactrim  Fluconazole  IM penicillin  DVT prophylaxis: Subcu Lovenox   Objective: Vitals:   08/18/18 2245 08/19/18 0424 08/19/18 1438 08/20/18 0653  BP: 124/79 122/78 117/63 103/66  Pulse: 94 100 96 97  Resp: _0 Temp: 100.2 F (37.9 C) 99.9 F (37.7 C) 98.7 F (37.1 C) 99.7 F (37.6 C)  TempSrc: Oral Oral Oral Oral  SpO2: 94% 96% 95% 96%  Weight:      Height:        Intake/Output Summary (Last 24 hours) at 08/20/2018 1248 Last data filed at 08/20/2018 7793 Gross per 24 hour  Intake 1609.6 ml  Output 400 ml  Net 1209.6 ml   Filed Weights   08/17/18 1625 08/17/18 2143  Weight: 68 kg 68.2 kg    Exam:  . General: 47 y.o. year-old male frail in no acute distress.  Alert and oriented x3.  Cardiovascular: Regular rate and rhythm with no rubs or gallops.  No JVD or thyromegaly noted.   Marland Kitchen Respiratory: Clear to auscultation with no wheezes or rales.  Good inspiratory effort. . Abdomen: Soft nontender nondistended with normal bowel sounds x4 quadrants. . Musculoskeletal: Knees and elbows appear inflamed with effusion bilaterally. . Skin: Severe psoriatic appearing lesions affecting scalp toes and fingers. Marland Kitchen Psychiatry: Mood is  appropriate for condition and setting   Data Reviewed: CBC: Recent Labs  Lab 08/17/18 1527 08/18/18 0337 08/19/18 0343 08/20/18 0408  WBC 3.1* 2.6* 2.5* 2.3*  NEUTROABS 2.3  --   --   --   HGB 9.4* 8.3* 8.1* 8.0*  HCT 30.1* 27.1* 26.4* 25.2*  MCV 82.5 83.6 84.3 83.2  PLT 149* 161 148* 903*   Basic Metabolic Panel: Recent Labs  Lab 08/17/18 1527 08/17/18 1915 08/18/18 0337 08/19/18 0343 08/20/18 0408  NA 137  --  137  --   --   K 3.8  --  3.4* 3.7  --   CL 98  --  100  --   --   CO2 29  --  27  --   --   GLUCOSE 110*  --  108*  --   --   BUN 15  --  15  --   --   CREATININE 0.72  --  0.61 0.55* 0.69  CALCIUM 8.4*  --  8.1*  --   --   MG  --  2.1  --   --   --    GFR: Estimated Creatinine Clearance: 111.3 mL/min (by C-G formula based on SCr of 0.69 mg/dL). Liver Function Tests: Recent Labs  Lab 08/17/18 1527  AST 62*  ALT 58*  ALKPHOS 168*  BILITOT 1.0  PROT 8.7*  ALBUMIN 2.2*   Recent Labs  Lab 08/17/18 1527  LIPASE 24   No results for input(s): AMMONIA in the last 168 hours. Coagulation Profile: No results for input(s): INR, PROTIME in the last 168 hours. Cardiac Enzymes: Recent Labs  Lab 08/17/18 1915  CKTOTAL 21*   BNP (last 3 results) No results for input(s): PROBNP in the last 8760 hours. HbA1C: No results for input(s): HGBA1C in the last 72 hours. CBG: No results for input(s): GLUCAP in the last 168 hours. Lipid Profile: No results for input(s): CHOL, HDL, LDLCALC, TRIG, CHOLHDL, LDLDIRECT in the last 72 hours. Thyroid Function Tests: Recent Labs    08/17/18 1529 08/17/18 1909  TSH 3.047  --   FREET4  --  1.21   Anemia Panel: No results for input(s): VITAMINB12, FOLATE, FERRITIN, TIBC, IRON, RETICCTPCT in the last 72 hours. Urine analysis:    Component Value Date/Time   COLORURINE AMBER (A) 08/18/2018 0550   APPEARANCEUR HAZY (A) 08/18/2018 0550   LABSPEC 1.029 08/18/2018 0550   PHURINE 5.0 08/18/2018 0550   GLUCOSEU  NEGATIVE 08/18/2018 0550   HGBUR SMALL (A) 08/18/2018 0550   BILIRUBINUR NEGATIVE 08/18/2018 0550   KETONESUR NEGATIVE 08/18/2018 0550   PROTEINUR 100 (A) 08/18/2018 0550   NITRITE NEGATIVE 08/18/2018 0550   LEUKOCYTESUR NEGATIVE 08/18/2018 0550   Sepsis Labs: _0 (procalcitonin:4,lacticidven:4)  ) Recent Results (from the past 240 hour(s))  Blood culture (routine x 2)     Status: None (Preliminary result)   Collection Time: 08/17/18  3:27 PM  Result Value Ref [place] Status   Specimen Description   Final    BLOOD RIGHT HAND Performed at [facility], [place] [address], [place], [place] [address]    Special Requests   Final    BOTTLES DRAWN AEROBIC AND ANAEROBIC Blood Culture adequate volume Performed at [place] [address], [place], [place] [address]    Culture   Final    NO GROWTH 3 DAYS Performed at [facility], [place] [address], [place], [place] [address]    Report Status PENDING  Incomplete  Blood culture (routine x 2)     Status: None (Preliminary result)   Collection Time: 08/17/18  3:36 PM  Result Value Ref [place] Status   Specimen Description   Final    BLOOD RIGHT HAND Performed at [place] [address], [place], [place] [address]    Special Requests   Final    BOTTLES DRAWN AEROBIC AND ANAEROBIC Blood Culture results may not be optimal due to an excessive volume of blood received in culture bottles Performed at [place] [address] [place], [place] [address]    Culture   Final    NO GROWTH 3 DAYS Performed at [facility], [place] [address] [place], [place] [address]    Report Status PENDING  Incomplete  Urine culture     Status: Abnormal   Collection Time: 08/18/18  5:50 AM  Result Value Ref [place] Status   Specimen Description   Final    URINE, CLEAN CATCH Performed at [facility], [place] [address] [place], [place] [address]     Special Requests   Final    NONE Performed at [facility], [place] [address] [place],  [address]    Culture (A)  Final    <10,000 COLONIES/mL INSIGNIFICANT GROWTH Performed at [place] [address], [place],  [address]  Report Status 08/19/2018 FINAL  Final  MRSA PCR Screening     Status: None   Collection Time: 08/18/18  7:20 AM  Result Value Ref [place] Status   MRSA by PCR NEGATIVE NEGATIVE Final    Comment:        The GeneXpert MRSA Assay (FDA approved for NASAL specimens only), is one component of a comprehensive MRSA colonization surveillance program. It is not intended to diagnose MRSA infection nor to guide or monitor treatment for MRSA infections. Performed at [facility], [place] [address], [place], [place] [address]       Studies: No results found.  Scheduled Meds: . bictegravir-emtricitabine-tenofovir AF  1 tablet Oral Daily  . enoxaparin (LOVENOX) injection  40 mg Subcutaneous Q24H  . feeding supplement (ENSURE ENLIVE)  237 mL Oral BID BM  . fluconazole  100 mg Oral Weekly  . penicillin g benzathine (BICILLIN-LA) IM  2.4 Million Units Intramuscular Weekly  . sulfamethoxazole-trimethoprim  1 tablet Oral Daily    Continuous Infusions: . sodium chloride 75 mL/hr at 08/20/18 5945     LOS: 3 days     Kayleen Memos, MD [facility] Pager [phone]  If 7PM-7AM, please contact night-coverage [email or web address] Password [facility] 08/20/2018, 12:48 PM

## 2018-08-20 NOTE — Evaluation (Addendum)
Occupational Therapy Evaluation Patient Details Name: Patrick Huber MRN: [id number] DOB: July 10, 1971 Today's Date: 08/20/2018    History of Present Illness 47 y.o. male with medical history significant for right knee mass status post resection in July at [facility], tobacco use disorder who presented from home to [facility] ED due to gradually worsening generalized weakness and severe right knee pain of 3 weeks duration.  Reports intermittent chills and subjective fevers, new skin lesions affecting his fingers, toes and scalp.  Significant family history of psoriasis and eczema.  In July had a mass on his right knee which was removed at [facility].  States since then about 3 weeks ago started having diffused joint pain associated with generalized weakness. HIV screening positive, further testing pending.    Clinical Impression   Patient has decreased strength and is having difficulty with performing  ADLs and mobility. Patient lives alone and states he will be returning home with exwife who will stay with him. Patient states that his exwife is able to assist at his current level of care. Patient is currently Min A with UE ADLs     Secondary to R elbow pain and ROM. Patient is Max A with LE dressing secondary to pain and weakness. Patient would benefit from SNF stay for rehab but states he does not have insurance. Patient to be followed by acute OT until d/c home.     Follow Up Recommendations       Equipment Recommendations       Recommendations for Other Services       Precautions / Restrictions Precautions Precautions: Fall Precaution Comments: Patient reports several falls at home Restrictions Weight Bearing Restrictions: No      Mobility Bed Mobility         Supine to sit: Min assist     General bed mobility comments: Patient required cues for proper hand placement and cues for proper technique.   Transfers       Sit to Stand: Min assist;+2 physical assistance;+2 safety/equipment;From  elevated surface Stand pivot transfers: Min assist;+2 safety/equipment       General transfer comment: Patient was able to use RW for transfers. with stand pivot.    Balance                                           ADL either performed or assessed with clinical judgement   ADL Overall ADL's : Needs assistance/impaired Eating/Feeding: Independent   Grooming: Supervision/safety;Set up;Sitting   Upper Body Bathing: Minimal assistance;Sitting   Lower Body Bathing: Moderate assistance;+2 for safety/equipment   Upper Body Dressing : Minimal assistance;Sitting   Lower Body Dressing: Moderate assistance;+2 for safety/equipment Lower Body Dressing Details (indicate cue type and reason): Patient does not wear socks secondary to toe pain. Patient wears slip on shoes. Patient brings legs up to simulate donning pants.             Functional mobility during ADLs: Minimal assistance;+2 for safety/equipment;Rolling walker(Patient performed stand pivot transfer bed to chair) General ADL Comments: (Patient ed to cross legs to don pants. )     Vision Baseline Vision/History: Wears glasses Wears Glasses: Reading only       Perception     Praxis      Pertinent Vitals/Pain Pain Assessment: 0-10 Pain Score: 8  Pain Location: B LE AND BACK Pain Descriptors / Indicators: Aching Pain Intervention(s): Limited  activity within patient's tolerance;Monitored during session;Patient requesting pain meds-RN notified     Hand Dominance Right   Extremity/Trunk Assessment Upper Extremity Assessment Upper Extremity Assessment: RUE deficits/detail RUE Deficits / Details: elbow extension painful, lacks~30* elbow extension AROM  RUE: (Patient has swelling in elbow. Patient nurse notified.)           Communication Communication Communication: No difficulties   Cognition Arousal/Alertness: Awake/alert Behavior During Therapy: WFL for tasks assessed/performed Overall  Cognitive Status: Within Functional Limits for tasks assessed                                     General Comments       Exercises     Shoulder Instructions      Home Living Family/patient expects to be discharged to:: Private residence Living Arrangements: Alone Available Help at Discharge: Friend(s)(ex wife stops by daily, she works and is out of town until [place] or [place])   Home Access: Stairs to enter Technical brewer of Steps: 8 steps in front, 0 in back   Dante: One level     Bathroom Shower/Tub: Teacher, early years/pre: Standard Bathroom Accessibility: Yes How Accessible: Accessible via walker Home Equipment: Gilford Rile - 2 wheels;Crutches          Prior Functioning/Environment Level of Independence: Independent        Comments: 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 device, drives tow truck        OT Problem List: Decreased strength;Decreased activity tolerance;Pain      OT Treatment/Interventions: Self-care/ADL training;DME and/or AE instruction;Therapeutic activities    OT Goals(Current goals can be found in the care plan section) Acute Rehab OT Goals Patient Stated Goal: go home and be able to walk OT Goal Formulation: With patient Time For Goal Achievement: 09/03/18 Potential to Achieve Goals: Good  OT Frequency: Min 2X/week   Barriers to D/C: Decreased caregiver support  Patient states his exwife is in [place] currently but will be returning to [facility] and will stay with him to assist with care.        Co-evaluation              AM-PAC PT "6 Clicks" Daily Activity     Outcome Measure Help from another person eating meals?: None Help from another person taking care of personal grooming?: A Little Help from another person toileting, which includes using toliet, bedpan, or urinal?: A Lot Help from another person bathing (including washing, rinsing, drying)?: A Lot Help from another person to put on and taking off  regular upper body clothing?: A Little Help from another person to put on and taking off regular lower body clothing?: A Lot 6 Click Score: 16   End of Session Equipment Utilized During Treatment: Gait belt;Rolling walker Nurse Communication: Patient requests pain meds  Activity Tolerance: Patient limited by pain Patient left: in chair;with call bell/phone within reach;with chair alarm set  OT Visit Diagnosis: Unsteadiness on feet (R26.81);Repeated falls (R29.6);History of falling (Z91.81)                Time: 4403-4742 OT Time Calculation (min): 30 min Charges:  OT General Charges $OT Visit: 1 Visit OT Evaluation $OT Eval Low Complexity: 1 Low OT Treatments $Self Care/Home Management : 5-95 mins  6 clicks  Charlita Brian 08/20/2018, 10:12 AM

## 2018-08-20 NOTE — Telephone Encounter (Signed)
Rachel Bo, pharmacist at [facility], contacted me to help patient obtain Phillips Odor since he is uninsured.  Applied through [employer] and was able to get an immediate 30 day supply. Will send to [facility] and Rachel Bo will pick up later today for patient.  Patient needs to apply for HMAP as soon as possible but he will be approved for [place] assistance for likely a year.

## 2018-08-20 NOTE — Progress Notes (Signed)
Patient ID: Patrick Huber, male   DOB: 10-04-1971, 47 y.o.   MRN: [id number]         [facility] for Infectious Disease  Date of Admission:  08/17/2018     ASSESSMENT: As suspected, his HIV antibody is positive confirming chronic HIV infection.  His CD4 count is dangerously low at 20.  He started [facility] yesterday and had no problems tolerating it.  I told him that it is quite likely that he will feel dramatically better over the next few months.  His RPR is also positive at a titer of 1:32.  I will treat him for presumed late, latent syphilis with 2,400,000 units of benzathine penicillin IM weekly x 3 doses.  PLAN: 1. Continue Biktarvy (we are working on patient assistance for him) 2. Continue prophylactic doses of trimethoprim sulfamethoxazole and fluconazole 3. Penicillin 2,400,000 units IM weekly x 3 doses  Principal Problem:   HIV disease ([place]) Active Problems:   History of MRSA infection   Syphilis   Generalized weakness   Malnutrition of moderate degree   Diarrhea   Unintentional weight loss   Polyarthralgia   Knee mass, right   Pancytopenia (HCC)   Cigarette smoker   GERD (gastroesophageal reflux disease)   Rash   Scheduled Meds: . bictegravir-emtricitabine-tenofovir AF  1 tablet Oral Daily  . enoxaparin (LOVENOX) injection  40 mg Subcutaneous Q24H  . feeding supplement (ENSURE ENLIVE)  237 mL Oral BID BM  . fluconazole  100 mg Oral Weekly  . sulfamethoxazole-trimethoprim  1 tablet Oral Daily   Continuous Infusions: . sodium chloride 75 mL/hr at 08/20/18 0613   PRN Meds:.acetaminophen, ondansetron (ZOFRAN) IV, oxyCODONE, traMADol   SUBJECTIVE: He has not had any diarrhea since admission.  He is still having diffuse joint pain but feels like it is getting a little bit better.  Review of Systems: Review of Systems  Constitutional: Positive for malaise/fatigue and weight loss. Negative for chills, diaphoresis and fever.  HENT: Negative for  congestion and sore throat.   Eyes: Negative for blurred vision and double vision.  Respiratory: Negative for cough, sputum production and shortness of breath.   Cardiovascular: Negative for chest pain.  Gastrointestinal: Negative for abdominal pain, diarrhea, nausea and vomiting.  Genitourinary: Negative for dysuria.  Musculoskeletal: Positive for joint pain.  Skin: Positive for rash.    Allergies  Allergen Reactions  . Meperidine Anaphylaxis    OBJECTIVE: Vitals:   08/18/18 2245 08/19/18 0424 08/19/18 1438 08/20/18 0653  BP: 124/79 122/78 117/63 103/66  Pulse: 94 100 96 97  Resp: 18 19 16 17   Temp: 100.2 F (37.9 C) 99.9 F (37.7 C) 98.7 F (37.1 C) 99.7 F (37.6 C)  TempSrc: Oral Oral Oral Oral  SpO2: 94% 96% 95% 96%  Weight:      Height:       Body mass index is 19.84 kg/m.  Physical Exam  Constitutional: He is oriented to person, place, and time.  He is sitting up in a chair.  He is in good spirits.  Neck:  His neck remains stiff and he resists looking left.  Musculoskeletal:  As far persistent and swelling of his elbows.    Neurological: He is alert and oriented to person, place, and time.  Skin: Rash noted.  Psychiatric: He has a normal mood and affect.    Lab Results Lab Results  Component Value Date   WBC 2.3 (L) 08/20/2018   HGB 8.0 (L) 08/20/2018   HCT 25.2 (L) 08/20/2018  MCV 83.2 08/20/2018   PLT 140 (L) 08/20/2018    Lab Results  Component Value Date   CREATININE 0.69 08/20/2018   BUN 15 08/18/2018   NA 137 08/18/2018   K 3.7 08/19/2018   CL 100 08/18/2018   CO2 27 08/18/2018    Lab Results  Component Value Date   ALT 58 (H) 08/17/2018   AST 62 (H) 08/17/2018   ALKPHOS 168 (H) 08/17/2018   BILITOT 1.0 08/17/2018     Microbiology: Recent Results (from the past 240 hour(s))  Blood culture (routine x 2)     Status: None (Preliminary result)   Collection Time: 08/17/18  3:27 PM  Result Value Ref Range Status   Specimen  Description   Final    BLOOD RIGHT HAND Performed at [facility], [place] [address], [place], [place] [address]    Special Requests   Final    BOTTLES DRAWN AEROBIC AND ANAEROBIC Blood Culture adequate volume Performed at [place] [address] [place], [place] [address]    Culture   Final    NO GROWTH 3 DAYS Performed at [facility], [place] [address], [place], [place] [address]    Report Status PENDING  Incomplete  Blood culture (routine x 2)     Status: None (Preliminary result)   Collection Time: 08/17/18  3:36 PM  Result Value Ref Range Status   Specimen Description   Final    BLOOD RIGHT HAND Performed at [facility] [address], [place], [place] [address]    Special Requests   Final    BOTTLES DRAWN AEROBIC AND ANAEROBIC Blood Culture results may not be optimal due to an excessive volume of blood received in culture bottles Performed at [facility] [address], [place], [place] [address]    Culture   Final    NO GROWTH 3 DAYS Performed at [facility], [place] [address], [place], [place] [address]    Report Status PENDING  Incomplete  Urine culture     Status: Abnormal   Collection Time: 08/18/18  5:50 AM  Result Value Ref Range Status   Specimen Description   Final    URINE, CLEAN CATCH Performed at [facility], [place] [address] [place], [place] [address]    Special Requests   Final    NONE Performed at [facility], [place] [address] [place], [place] [address]    Culture (A)  Final    <10,000 COLONIES/mL INSIGNIFICANT GROWTH Performed at [place] [address] [place], [place] [address]    Report Status 08/19/2018 FINAL  Final  MRSA PCR Screening     Status: None   Collection Time: 08/18/18  7:20 AM  Result Value Ref Range Status   MRSA by PCR NEGATIVE NEGATIVE Final    Comment:        The GeneXpert MRSA Assay  (FDA approved for NASAL specimens only), is one component of a comprehensive MRSA colonization surveillance program. It is not intended to diagnose MRSA infection nor to guide or monitor treatment for MRSA infections. Performed at [facility], [place] [address] [place], [place] [address]     Michel Bickers, [place] [facility] [place] [facility] [phone] pager   [phone] cell 08/20/2018, 11:19 AM

## 2018-08-21 ENCOUNTER — Encounter: Payer: Self-pay | Admitting: Internal Medicine

## 2018-08-21 LAB — GASTROINTESTINAL PANEL BY PCR, STOOL (REPLACES STOOL CULTURE)
Adenovirus F40/41: NOT DETECTED
Astrovirus: NOT DETECTED
CAMPYLOBACTER SPECIES: NOT DETECTED
Cryptosporidium: NOT DETECTED
Cyclospora cayetanensis: NOT DETECTED
ENTEROAGGREGATIVE E COLI (EAEC): NOT DETECTED
Entamoeba histolytica: NOT DETECTED
Enteropathogenic E coli (EPEC): NOT DETECTED
Enterotoxigenic E coli (ETEC): NOT DETECTED
GIARDIA LAMBLIA: NOT DETECTED
NOROVIRUS GI/GII: NOT DETECTED
PLESIMONAS SHIGELLOIDES: NOT DETECTED
ROTAVIRUS A: NOT DETECTED
SALMONELLA SPECIES: NOT DETECTED
SHIGA LIKE TOXIN PRODUCING E COLI (STEC): NOT DETECTED
SHIGELLA/ENTEROINVASIVE E COLI (EIEC): NOT DETECTED
Sapovirus (I, II, IV, and V): NOT DETECTED
Vibrio cholerae: NOT DETECTED
Vibrio species: NOT DETECTED
Yersinia enterocolitica: NOT DETECTED

## 2018-08-21 LAB — CREATININE, SERUM
CREATININE: 0.63 mg/dL (ref 0.61–1.24)
GFR calc non Af Amer: >60 mL/min (ref >60)

## 2018-08-21 NOTE — Care Management (Signed)
This CM spoke with pt at bedside about DC planning. When PT recommendations discussed, patient states that he would have to go home because he can't afford SNF. He is without insurance. He states that he will have 24hr assistance at home. [facility] rep alerted of pt need for charity care (RN/PT/OT and a BSC). Will need MD orders.  Pt states that if he is able to do the [facility] program for his meds that he can borrow the $3 copay.  Per conversation with Pharmacist Rachel Bo, pt is able to receive Biktarvy for free. (Please see Pharmacy note).  Marney Doctor RN,BSN [phone]

## 2018-08-21 NOTE — Progress Notes (Addendum)
Occupational Therapy Treatment Patient Details Name: Patrick Huber MRN: [id number] DOB: [birth year] Today's Date: 08/21/2018    History of present illness 47 y.o. male with medical history significant for right knee mass status post resection in July at [facility], tobacco use disorder who presented from home to [facility] ED due to gradually worsening generalized weakness and severe right knee pain of 3 weeks duration.  Reports intermittent chills and subjective fevers, new skin lesions affecting his fingers, toes and scalp.  Significant family history of psoriasis and eczema.  In July had a mass on his right knee which was removed at [facility].  States since then about 3 weeks ago started having diffused joint pain associated with generalized weakness. HIV screening positive, further testing pending.    OT comments  Educated on AE; issued reacher and long sponge. Pt got to eob with min guard assist  Follow Up Recommendations  SNF;Supervision/Assistance - 24 hour    Equipment Recommendations  3 in 1 bedside commode    Recommendations for Other Services      Precautions / Restrictions Precautions Precautions: Fall Precaution Comments: Patient reports several falls at home Restrictions Weight Bearing Restrictions: No       Mobility Bed Mobility         Supine to sit: Min guard     General bed mobility comments: use of rails; close guarding to avoid hitting feet on rails  Transfers                 General transfer comment: breakfast arrived. Pt wanted to eat prior to getting up    Balance                                           ADL either performed or assessed with clinical judgement   ADL               Lower Body Bathing: Minimal assistance;Sit to/from stand       Lower Body Dressing: Moderate assistance;Sit to/from stand;With adaptive equipment                 General ADL Comments: educated on AE and used reacher.  Attempted sock aide, but  pt could not comfortably use. May benefit from wider model, but pt may just have exwife assist or go barefoot     Vision       Perception     Praxis      Cognition Arousal/Alertness: Awake/alert Behavior During Therapy: WFL for tasks assessed/performed Overall Cognitive Status: Within Functional Limits for tasks assessed                                          Exercises     Shoulder Instructions       General Comments      Pertinent Vitals/ Pain       Pain Score: 6  Pain Location: toes Pain Descriptors / Indicators: Aching Pain Intervention(s): Limited activity within patient's tolerance;Monitored during session  Home Living                                          Prior Functioning/Environment  Frequency  Min 2X/week        Progress Toward Goals  OT Goals(current goals can now be found in the care plan section)  Progress towards OT goals: Progressing toward goals     Plan      Co-evaluation                 AM-PAC PT "6 Clicks" Daily Activity     Outcome Measure   Help from another person eating meals?: None Help from another person taking care of personal grooming?: A Little Help from another person toileting, which includes using toliet, bedpan, or urinal?: A Little Help from another person bathing (including washing, rinsing, drying)?: A Little Help from another person to put on and taking off regular upper body clothing?: A Lot  Help from another person to put on and take off lower body clothing?  A Lot   6 Click Score: 17    End of Session    OT Visit Diagnosis: Unsteadiness on feet (R26.81);Repeated falls (R29.6);History of falling (Z91.81)   Activity Tolerance Patient tolerated treatment well   Patient Left in bed(EOB with alarm on)   Nurse Communication          Time: 0045-9977 OT Time Calculation (min): 12 min  Charges: OT General Charges $OT Visit: 1 Visit OT  Treatments $Self Care/Home Management : 8-22 mins  Lesle Chris, OTR/L Acute Rehabilitation Services [phone] [place] pager [phone] office 08/21/2018   [place] 08/21/2018, 10:55 AM

## 2018-08-21 NOTE — Progress Notes (Signed)
Bedside report given to [facility], Therapist, sports.

## 2018-08-21 NOTE — Progress Notes (Signed)
Physical Therapy Treatment Patient Details Name: Patrick Huber MRN: [id number] DOB: 05/13/1971 Today's Date: 08/21/2018    History of Present Illness 47 y.o. male with medical history significant for right knee mass status post resection in July at [facility], tobacco use disorder who presented from home to [facility] ED due to gradually worsening generalized weakness and severe right knee pain of 3 weeks duration.  Reports intermittent chills and subjective fevers, new skin lesions affecting his fingers, toes and scalp.  Significant family history of psoriasis and eczema.  In July had a mass on his right knee which was removed at [facility].  States since then about 3 weeks ago started having diffused joint pain associated with generalized weakness. HIV screening positive, further testing pending.     PT Comments    Pt refused OOB; agreeable to exercises, therefore exercise focused session today  Follow Up Recommendations  SNF;Supervision/Assistance - 24 hour     Equipment Recommendations  None recommended by PT    Recommendations for Other Services       Precautions / Restrictions Precautions Precautions: Fall Precaution Comments: Patient reports several falls at home Restrictions Weight Bearing Restrictions: No    Mobility  Bed Mobility         Supine to sit: Min guard     General bed mobility comments: pt refused OOB since he was up earlier today  Transfers   Equipment used: Rolling walker (2 wheeled)   Sit to Stand: Min guard Stand pivot transfers: Min guard       General transfer comment: cues for UE placement and safety. Pt requested bed to be extra high. Also worked on getting up from chair. Pt really leans forward when standing from lower surface; min guard needed for safety  Ambulation/Gait                 Stairs             Wheelchair Mobility    Modified Rankin (Stroke Patients Only)       Balance                                            Cognition Arousal/Alertness: Awake/alert Behavior During Therapy: WFL for tasks assessed/performed Overall Cognitive Status: Within Functional Limits for tasks assessed                                        Exercises General Exercises - Lower Extremity Ankle Circles/Pumps: AROM;Both;10 reps Quad Sets: AROM;Both;10 reps Heel Slides: AAROM;AROM;Both;10 reps(instructed in use of loop to self assist) Hip ABduction/ADduction: AROM;AAROM;Both;10 reps Straight Leg Raises: AAROM;AROM;10 reps;Both    General Comments        Pertinent Vitals/Pain Pain Score: 7  Pain Location: bil LEs Pain Descriptors / Indicators: Constant Pain Intervention(s): Limited activity within patient's tolerance;Monitored during session    Home Living                      Prior Function            PT Goals (current goals can now be found in the care plan section) Acute Rehab PT Goals PT Goal Formulation: With patient Time For Goal Achievement: 09/02/18 Potential to Achieve Goals: Fair Progress towards PT goals: Progressing toward goals(slowly)  Frequency    Min 2X/week      PT Plan Current plan remains appropriate;Frequency needs to be updated    Co-evaluation              AM-PAC PT "6 Clicks" Daily Activity  Outcome Measure  Difficulty turning over in bed (including adjusting bedclothes, sheets and blankets)?: Unable Difficulty moving from lying on back to sitting on the side of the bed? : Unable Difficulty sitting down on and standing up from a chair with arms (e.g., wheelchair, bedside commode, etc,.)?: Unable Help needed moving to and from a bed to chair (including a wheelchair)?: A Lot Help needed walking in hospital room?: Total Help needed climbing 3-5 steps with a railing? : Total 6 Click Score: 7    End of Session   Activity Tolerance: Patient limited by fatigue;Patient limited by pain Patient left: in bed;with call bell/phone  within reach;with bed alarm set Nurse Communication: Mobility status PT Visit Diagnosis: Difficulty in walking, not elsewhere classified (R26.2);Repeated falls (R29.6);History of falling (Z91.81);Muscle weakness (generalized) (M62.81);Pain Pain - Right/Left: Right(and left) Pain - part of body: Leg     Time: 1155-1208 PT Time Calculation (min) (ACUTE ONLY): 13 min  Charges:  $Therapeutic Exercise: 8-22 mins                     Kenyon Ana, PT Pager: [phone] 08/21/2018   [facility] [phone]    [facility] 08/21/2018, 1:50 PM

## 2018-08-21 NOTE — Progress Notes (Signed)
PROGRESS NOTE  Patrick Huber IEP:[id number] DOB: December 24, 1970 DOA: 08/17/2018 PCP: Patient, No Pcp Per  HPI/Recap of past 24 hours: Patrick Huber is a 47 y.o. male with medical history significant for right knee mass status post resection in July at [facility], tobacco use disorder who presented from home to [facility] ED due to gradually worsening generalized weakness and severe right knee pain of 3 weeks duration.  Reports intermittent chills and subjective fevers, new skin lesions affecting his fingers, toes and scalp.  Significant family history of psoriasis and eczema.  In July had a mass on his right knee which was removed at [facility]  States since then about 3 weeks ago started having diffused joint pain associated with generalized weakness.  Went to [facility] and was asked to return to his orthopedic surgeon.  States he was unable to make the appointment due to high co-pay.  Also reports diarrhea after eating.  As a result has abstained from eating for 4 days.  08/18/18: Patient seen and examined at his bedside.  He reports diffused severe joint pain rash affecting his fingers toes and scalp.  Increased pain medications with some relief.  HIV screening test positive.  Ordered confirmatory tests viral load and CD4.  ID also consulted.  08/19/2018: Patient seen and examined at bedside.  Diffused joint pain mildly improved with pain medications.  Still feels very weak.  PT to assess.  Encouraged increase p.o. protein calorie intake.  CD4 percentage 3.  Infectious disease following.  Highly appreciated. Will be started on HAART today.  08/20/2018: Patient seen and examined at his bedside.  No acute events overnight.  Reports his joint pains are improved with pain medications.  Started Biktarvy yesterday.  Case manager consulted to assist with medications and discharge needs.  08/21/2018: Patient seen and examined .  No acute events overnight.  Reports his symptoms are improving however still feels  weak.  Assessment/Plan: Principal Problem:   HIV disease ([place]) Active Problems:   Generalized weakness   Malnutrition of moderate degree   Diarrhea   Unintentional weight loss   Polyarthralgia   Knee mass, right   Pancytopenia (HCC)   Cigarette smoker   GERD (gastroesophageal reflux disease)   Rash   History of MRSA infection   Syphilis  Generalized weakness/physical debility most likely secondary to HIV infection ESR greater than 140 and CRP of 18 HIV 1/2 antibioties reactive-reports 2 lifetime male sexual partners; his separated wife and his girlfriend. 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 Continue IM penicillin 2,400,000 units weekly x3 doses Patient states he informed his wife of his newly diagnosed HIV and syphilis status  Improving severe diffuse joint pain with effusion affecting elbows and knees bilaterally Orthopedic surgery consulted to assess Continue supportive care  Physical debility/ambulatory dysfunction PT recommends SNF Fall precautions Social worker consulted to assist in placement  Resolved AKI Baseline creatinine 0.6 Continue to avoid nephrotoxic agents/dehydration/hypotension Monitor urine output  Resolved hypokalemia after repletion  Status post right knee mass resection Procedure done at [facility] in July 2019 X-ray of right knee with small suprapatellar bursa effusion  No sign of fracture or dislocation or infection  Persistent leukopenia and normocytic anemia most likely secondary to  HIV infection WBC 2.5 from 2.6 and hemoglobin 8.1 from 8.3 Will be started on treatment for HIV today Continue to monitor  Severe  protein calorie malnutrition Albumin 2.2 BMI 19 Continue oral supplementation Dietary consult to address nutritional needs  Tobacco use disorder Tobacco cessation counseling done at bedside Nicotine patch  Skin lesions suspect secondary to HIV infection Appearance of severe psoriasis particularly on the scalp Patient has not seen a dermatologist  Ambulatory dysfunction secondary to severe knee joint pain bilaterally Fall precautions PT to assess in the morning  Risks: Patient is a high risk for decompensation due to newly diagnosed HIV infection, severely elevated inflammatory markers and severe diffuse joint pain.   Code Status: Full code  Family Communication: None at bedside  Disposition Plan: Home in 1 to 2 days or when infectious disease signs off.   Consultants:  Infectious disease  Procedures:  None  Antimicrobials:  Bactrim  Fluconazole  IM penicillin  DVT prophylaxis: Subcu Lovenox   Objective: Vitals:   08/20/18 1526 08/20/18 1938 08/21/18 0445 08/21/18 1425  BP: 95/72 122/60 110/62 115/68  Pulse: 93 84 90 86  Resp: 17 18 16 16   Temp: 98.2 F (36.8 C) 99 F (37.2 C) 98 F (36.7 C) 98.8 F (37.1 C)  TempSrc: Oral Oral Oral Oral  SpO2: 97% 98% 97% 99%  Weight:      Height:        Intake/Output Summary (Last 24 hours) at 08/21/2018 1518 Last data filed at 08/21/2018 1426 Gross per 24 hour  Intake 2677.73 ml  Output 2200 ml  Net 477.73 ml   Filed Weights   08/17/18 1625 08/17/18 2143  Weight: 68 kg 68.2 kg    Exam:  . General: 47 y.o. year-old male frail in no acute distress.  Alert and oriented x3. . Cardiovascular: Regular rate and rhythm with no rubs or gallops.  No JVD or thyromegaly noted.  Marland Kitchen Respiratory: Clear to auscultation with no wheezes or rales.  Good inspiratory effort. . Abdomen: Soft nontender nondistended with normal bowel sounds x4 quadrants. . Musculoskeletal: Knees and elbows appear inflamed with effusion  bilaterally. . Skin: Severe psoriatic appearing lesions affecting scalp toes and fingers.  Another nontender lesion on his left arm, looks like syphilis rash. Marland Kitchen Psychiatry: Mood is appropriate for condition and setting   Data Reviewed: CBC: Recent Labs  Lab 08/17/18 1527 08/18/18 0337 08/19/18 0343 08/20/18 0408  WBC 3.1* 2.6* 2.5* 2.3*  NEUTROABS 2.3  --   --   --   HGB 9.4* 8.3* 8.1* 8.0*  HCT 30.1* 27.1* 26.4* 25.2*  MCV 82.5 83.6 84.3 83.2  PLT 149* 161 148* 741*   Basic Metabolic Panel: Recent Labs  Lab 08/17/18 1527 08/17/18 1915 08/18/18 0337 08/19/18 0343 08/20/18 0408 08/21/18 0431  NA 137  --  137  --   --   --   K 3.8  --  3.4* 3.7  --   --   CL 98  --  100  --   --   --   CO2 29  --  27  --   --   --   GLUCOSE 110*  --  108*  --   --   --   BUN 15  --  15  --   --   --   CREATININE 0.72  --  0.61 0.55* 0.69 0.63  CALCIUM 8.4*  --  8.1*  --   --   --   MG  --  2.1  --   --   --   --    GFR: Estimated Creatinine Clearance: 111.3 mL/min (by C-G formula based on SCr of 0.63 mg/dL). Liver Function Tests: Recent Labs  Lab 08/17/18 1527  AST 62*  ALT 58*  ALKPHOS 168*  BILITOT 1.0  PROT 8.7*  ALBUMIN 2.2*   Recent Labs  Lab 08/17/18 1527  LIPASE 24   No results for input(s): AMMONIA in the last 168 hours. Coagulation Profile: No results for input(s): INR, PROTIME in the last 168 hours. Cardiac Enzymes: Recent Labs  Lab 08/17/18 1915  CKTOTAL 21*   BNP (last 3 results) No results for input(s): PROBNP in the last 8760 hours. HbA1C: No results for input(s): HGBA1C in the last 72 hours. CBG: No results for input(s): GLUCAP in the last 168 hours. Lipid Profile: No results for input(s): CHOL, HDL, LDLCALC, TRIG, CHOLHDL, LDLDIRECT in the last 72 hours. Thyroid Function Tests: No results for input(s): TSH, T4TOTAL, FREET4, T3FREE, THYROIDAB in the last 72 hours. Anemia Panel: No results for input(s): VITAMINB12, FOLATE, FERRITIN, TIBC, IRON,  RETICCTPCT in the last 72 hours. Urine analysis:    Component Value Date/Time   COLORURINE AMBER (A) 08/18/2018 0550   APPEARANCEUR HAZY (A) 08/18/2018 0550   LABSPEC 1.029 08/18/2018 0550   PHURINE 5.0 08/18/2018 0550   GLUCOSEU NEGATIVE 08/18/2018 0550   HGBUR SMALL (A) 08/18/2018 0550   BILIRUBINUR NEGATIVE 08/18/2018 0550   KETONESUR NEGATIVE 08/18/2018 0550   PROTEINUR 100 (A) 08/18/2018 0550   NITRITE NEGATIVE 08/18/2018 0550   LEUKOCYTESUR NEGATIVE 08/18/2018 0550   Sepsis Labs: @LABRCNTIP (procalcitonin:4,lacticidven:4)  ) Recent Results (from the past 240 hour(s))  Blood culture (routine x 2)     Status: None (Preliminary result)   Collection Time: 08/17/18  3:27 PM  Result Value Ref Range Status   Specimen Description   Final    BLOOD RIGHT HAND Performed at [facility], [place] [address], [place], [place] [address]    Special Requests   Final    BOTTLES DRAWN AEROBIC AND ANAEROBIC Blood Culture adequate volume Performed at [place] [address] [place], [place] [address]    Culture   Final    NO GROWTH 4 DAYS Performed at [facility], [place] [address] [place], [place] [address]    Report Status PENDING  Incomplete  Blood culture (routine x 2)     Status: None (Preliminary result)   Collection Time: 08/17/18  3:36 PM  Result Value Ref Range Status   Specimen Description   Final    BLOOD RIGHT HAND Performed at [place] [address], [place], [place] [address]    Special Requests   Final    BOTTLES DRAWN AEROBIC AND ANAEROBIC Blood Culture results may not be optimal due to an excessive volume of blood received in culture bottles Performed at [place] [address] [place], [place] [address]    Culture   Final    NO GROWTH 4 DAYS Performed at [facility], [place] [address], [place], [place] [address]    Report Status PENDING  Incomplete  Urine culture      Status: Abnormal   Collection Time: 08/18/18  5:50 AM  Result Value Ref Range Status   Specimen Description   Final    URINE, CLEAN CATCH Performed at [facility], [place] [address], [place],  [address]    Special Requests  Final    NONE Performed at [facility], [place] [address] [place], [place] [address]    Culture (A)  Final    <10,000 COLONIES/mL INSIGNIFICANT GROWTH Performed at [place] [address] [place], [place] [address]    Report Status 08/19/2018 FINAL  Final  MRSA PCR Screening     Status: None   Collection Time: 08/18/18  7:20 AM  Result Value Ref Range Status   MRSA by PCR NEGATIVE NEGATIVE Final    Comment:        The GeneXpert MRSA Assay (FDA approved for NASAL specimens only), is one component of a comprehensive MRSA colonization surveillance program. It is not intended to diagnose MRSA infection nor to guide or monitor treatment for MRSA infections. Performed at [facility], [place] [address] [place], [place] [address]   Gastrointestinal Panel by PCR , Stool     Status: None   Collection Time: 08/20/18  8:14 PM  Result Value Ref Range Status   Campylobacter species NOT DETECTED NOT DETECTED Final   Plesimonas shigelloides NOT DETECTED NOT DETECTED Final   Salmonella species NOT DETECTED NOT DETECTED Final   Yersinia enterocolitica NOT DETECTED NOT DETECTED Final   Vibrio species NOT DETECTED NOT DETECTED Final   Vibrio cholerae NOT DETECTED NOT DETECTED Final   Enteroaggregative E coli (EAEC) NOT DETECTED NOT DETECTED Final   Enteropathogenic E coli (EPEC) NOT DETECTED NOT DETECTED Final   Enterotoxigenic E coli (ETEC) NOT DETECTED NOT DETECTED Final   Shiga like toxin producing E coli (STEC) NOT DETECTED NOT DETECTED Final   Shigella/Enteroinvasive E coli (EIEC) NOT DETECTED NOT DETECTED Final   Cryptosporidium NOT DETECTED NOT DETECTED Final   Cyclospora cayetanensis NOT  DETECTED NOT DETECTED Final   Entamoeba histolytica NOT DETECTED NOT DETECTED Final   Giardia lamblia NOT DETECTED NOT DETECTED Final   Adenovirus F40/41 NOT DETECTED NOT DETECTED Final   Astrovirus NOT DETECTED NOT DETECTED Final   Norovirus GI/GII NOT DETECTED NOT DETECTED Final   Rotavirus A NOT DETECTED NOT DETECTED Final   Sapovirus (I, II, IV, and V) NOT DETECTED NOT DETECTED Final    Comment: Performed at [facility], [address], [place], [place] [address]      Studies: No results found.  Scheduled Meds: . bictegravir-emtricitabine-tenofovir AF  1 tablet Oral Daily  . enoxaparin (LOVENOX) injection  40 mg Subcutaneous Q24H  . feeding supplement (ENSURE ENLIVE)  237 mL Oral BID BM  . fluconazole  100 mg Oral Weekly  . penicillin g benzathine (BICILLIN-LA) IM  2.4 Million Units Intramuscular Weekly  . sulfamethoxazole-trimethoprim  1 tablet Oral Daily    Continuous Infusions: . sodium chloride 75 mL/hr at 08/21/18 0617     LOS: 4 days     Kayleen Memos, MD [facility] Pager [phone]  If 7PM-7AM, please contact night-coverage [email or web address] Password [facility] 08/21/2018, 3:18 PM

## 2018-08-21 NOTE — Progress Notes (Signed)
Pharmacy - Brief Note Biktarvy  Received supply of [place] from [facility] outpatient pharmacy 9/19.  Medication to be stored in pharmacy until time of discharge.  Medication assistance via [place] approved via Nature conservation officer, Cassie Kuppelweiser.  Patient educated on how to take medication and common side effects of Biktarvy, fluconazole, TMP/SMZ.  Case manager to help assess if candidate for [facility] program for fluconazole and TMP/SMZ  Doreene Eland, PharmD, BCPS.   Work Cell: [phone] 08/21/2018 12:02 PM

## 2018-08-21 NOTE — Progress Notes (Signed)
Patient ID: Patrick Huber, male   DOB: December 10, 1970, 47 y.o.   MRN: [id number]         [facility] for Infectious Disease  Date of Admission:  08/17/2018     ASSESSMENT: He has advanced HIV infection with a CD4 count that is critically low at 20 and her viral load 66,800.  I talked to him about the importance of not missing doses of his Biktarvy.  He says that he cannot toward a skilled nursing facility and will be going discharge.  He states that his ex-wife may be able to help take care of him starting sometime next week.  PLAN: 1. Continue Biktarvy (we are working on patient assistance for him) 2. Continue prophylactic doses of trimethoprim sulfamethoxazole and fluconazole 3. Please call my partner, Dr. Talbot Grumbling [phone]), for any infectious disease questions this weekend.   Principal Problem:   HIV disease ([place]) Active Problems:   History of MRSA infection   Syphilis   Generalized weakness   Malnutrition of moderate degree   Diarrhea   Unintentional weight loss   Polyarthralgia   Knee mass, right   Pancytopenia (HCC)   Cigarette smoker   GERD (gastroesophageal reflux disease)   Rash   Scheduled Meds: . bictegravir-emtricitabine-tenofovir AF  1 tablet Oral Daily  . enoxaparin (LOVENOX) injection  40 mg Subcutaneous Q24H  . feeding supplement (ENSURE ENLIVE)  237 mL Oral BID BM  . fluconazole  100 mg Oral Weekly  . penicillin g benzathine (BICILLIN-LA) IM  2.4 Million Units Intramuscular Weekly  . sulfamethoxazole-trimethoprim  1 tablet Oral Daily   Continuous Infusions: . sodium chloride 75 mL/hr at 08/21/18 0617   PRN Meds:.acetaminophen, ondansetron (ZOFRAN) IV, oxyCODONE, traMADol   SUBJECTIVE: He remains very weak but is feeling better.  Review of Systems: Review of Systems  Constitutional: Positive for malaise/fatigue and weight loss. Negative for chills, diaphoresis and fever.  HENT: Negative for congestion and sore throat.   Eyes: Negative  for blurred vision and double vision.  Respiratory: Negative for cough, sputum production and shortness of breath.   Cardiovascular: Negative for chest pain.  Gastrointestinal: Negative for abdominal pain, diarrhea, nausea and vomiting.  Genitourinary: Negative for dysuria.  Musculoskeletal: Positive for joint pain.  Skin: Positive for rash.    Allergies  Allergen Reactions  . Meperidine Anaphylaxis    OBJECTIVE: Vitals:   08/20/18 1526 08/20/18 1938 08/21/18 0445 08/21/18 1425  BP: 95/72 122/60 110/62 115/68  Pulse: 93 84 90 86  Resp: 17 18 16 16   Temp: 98.2 F (36.8 C) 99 F (37.2 C) 98 F (36.7 C) 98.8 F (37.1 C)  TempSrc: Oral Oral Oral Oral  SpO2: 97% 98% 97% 99%  Weight:      Height:       Body mass index is 19.84 kg/m.  Physical Exam  Constitutional: He is oriented to person, place, and time.  He is resting quietly in bed.  He appears a little more comfortable.  Neck:  His neck remains stiff and he resists looking left.  Musculoskeletal:  There is no change in the stiffness and swelling of his elbows.    Neurological: He is alert and oriented to person, place, and time.  Skin: Rash noted.  Psychiatric: He has a normal mood and affect.    Lab Results Lab Results  Component Value Date   WBC 2.3 (L) 08/20/2018   HGB 8.0 (L) 08/20/2018   HCT 25.2 (L) 08/20/2018   MCV 83.2 08/20/2018  PLT 140 (L) 08/20/2018    Lab Results  Component Value Date   CREATININE 0.63 08/21/2018   BUN 15 08/18/2018   NA 137 08/18/2018   K 3.7 08/19/2018   CL 100 08/18/2018   CO2 27 08/18/2018    Lab Results  Component Value Date   ALT 58 (H) 08/17/2018   AST 62 (H) 08/17/2018   ALKPHOS 168 (H) 08/17/2018   BILITOT 1.0 08/17/2018   HIV 1 RNA Quant (copies/mL)  Date Value  08/18/2018 66,800   CD4 T Cell Abs (/uL)  Date Value  08/18/2018 20 (L)     Microbiology: Recent Results (from the past 240 hour(s))  Blood culture (routine x 2)     Status: None  (Preliminary result)   Collection Time: 08/17/18  3:27 PM  Result Value Ref Range Status   Specimen Description   Final    BLOOD RIGHT HAND Performed at [facility], [place] [address], [place], [place] [address]    Special Requests   Final    BOTTLES DRAWN AEROBIC AND ANAEROBIC Blood Culture adequate volume Performed at [place] [address] [place], [place] [address]    Culture   Final    NO GROWTH 4 DAYS Performed at [facility], [place] [address], [place], [place] [address]    Report Status PENDING  Incomplete  Blood culture (routine x 2)     Status: None (Preliminary result)   Collection Time: 08/17/18  3:36 PM  Result Value Ref Range Status   Specimen Description   Final    BLOOD RIGHT HAND Performed at [place] [address], [place], [place] [address]    Special Requests   Final    BOTTLES DRAWN AEROBIC AND ANAEROBIC Blood Culture results may not be optimal due to an excessive volume of blood received in culture bottles Performed at [place] [address] [place], [place] [address]    Culture   Final    NO GROWTH 4 DAYS Performed at [facility], [place] [address] [place], [place] [address]    Report Status PENDING  Incomplete  Urine culture     Status: Abnormal   Collection Time: 08/18/18  5:50 AM  Result Value Ref Range Status   Specimen Description   Final    URINE, CLEAN CATCH Performed at [facility], [place] [address] [place], [place] [address]    Special Requests   Final    NONE Performed at [facility], [place] [address], [place], [place] [address]    Culture (A)  Final    <10,000 COLONIES/mL INSIGNIFICANT GROWTH Performed at [place] [address], [place], [place] [address]    Report Status 08/19/2018 FINAL  Final  MRSA PCR Screening     Status: None   Collection Time: 08/18/18  7:20 AM  Result Value  Ref Range Status   MRSA by PCR NEGATIVE NEGATIVE Final    Comment:        The GeneXpert MRSA Assay (FDA approved for NASAL specimens only), is one component of a comprehensive MRSA colonization surveillance program. It is not intended to diagnose MRSA infection nor to guide or monitor treatment for MRSA infections. Performed at [facility], [place]  [address], [place], [place] [address]   Gastrointestinal Panel by PCR , Stool     Status: None   Collection Time: 08/20/18  8:14 PM  Result Value Ref Range Status   Campylobacter species NOT DETECTED NOT DETECTED Final   Plesimonas shigelloides NOT DETECTED NOT DETECTED Final   Salmonella species NOT DETECTED NOT DETECTED Final   Yersinia enterocolitica NOT DETECTED NOT DETECTED Final   Vibrio species NOT DETECTED NOT DETECTED Final   Vibrio cholerae NOT DETECTED NOT DETECTED Final   Enteroaggregative E coli (EAEC) NOT DETECTED NOT DETECTED Final   Enteropathogenic E coli (EPEC) NOT DETECTED NOT DETECTED Final   Enterotoxigenic E coli (ETEC) NOT DETECTED NOT DETECTED Final   Shiga like toxin producing E coli (STEC) NOT DETECTED NOT DETECTED Final   Shigella/Enteroinvasive E coli (EIEC) NOT DETECTED NOT DETECTED Final   Cryptosporidium NOT DETECTED NOT DETECTED Final   Cyclospora cayetanensis NOT DETECTED NOT DETECTED Final   Entamoeba histolytica NOT DETECTED NOT DETECTED Final   Giardia lamblia NOT DETECTED NOT DETECTED Final   Adenovirus F40/41 NOT DETECTED NOT DETECTED Final   Astrovirus NOT DETECTED NOT DETECTED Final   Norovirus GI/GII NOT DETECTED NOT DETECTED Final   Rotavirus A NOT DETECTED NOT DETECTED Final   Sapovirus (I, II, IV, and V) NOT DETECTED NOT DETECTED Final    Comment: Performed at [facility], [address] [place], [place] [address]    Alverto Shedd, [place] for Infectious [facility] [phone] pager   [phone] cell 08/21/2018, 3:17 PM

## 2018-08-21 NOTE — Progress Notes (Signed)
   08/21/18 1059  OT Visit Information  Last OT Received On 08/21/18  Assistance Needed +2  History of Present Illness 47 y.o. male with medical history significant for right knee mass status post resection in July at [facility], tobacco use disorder who presented from home to [facility] ED due to gradually worsening generalized weakness and severe right knee pain of 3 weeks duration.  Reports intermittent chills and subjective fevers, new skin lesions affecting his fingers, toes and scalp.  Significant family history of psoriasis and eczema.  In July had a mass on his right knee which was removed at [facility].  States since then about 3 weeks ago started having diffused joint pain associated with generalized weakness. HIV screening positive, further testing pending.   Precautions  Precautions Fall  Precaution Comments Patient reports several falls at home  Pain Assessment  Pain Score 6  Pain Location toes  Pain Descriptors / Indicators Aching  Pain Intervention(s) Limited activity within patient's tolerance  Cognition  Arousal/Alertness Awake/alert  Behavior During Therapy WFL for tasks assessed/performed  Overall Cognitive Status Within Functional Limits for tasks assessed  ADL  Toilet Transfer Min guard;Stand-pivot;RW (chair)  General ADL Comments for safety.  Used reacher for retrieving items from floor. Declined trying wider sock aide  Restrictions  Weight Bearing Restrictions No  Transfers  Equipment used Rolling walker (2 wheeled)  Sit to Stand Min guard  Stand pivot transfers Min guard  General transfer comment cues for UE placement and safety. Pt requested bed to be extra high. Also worked on getting up from chair. Pt really leans forward when standing from lower surface; min guard needed for safety  OT - End of Session  Activity Tolerance Patient tolerated treatment well  Patient left in chair;with call bell/phone within reach;with chair alarm set  OT Assessment/Plan  OT Visit Diagnosis  Unsteadiness on feet (R26.81);Repeated falls (R29.6);History of falling (Z91.81)  OT Frequency (ACUTE ONLY) Min 2X/week  Follow Up Recommendations Supervision/Assistance - 24 hour;SNF (pt reports exwife can stay with him)  OT Equipment 3 in 1 bedside commode  AM-PAC OT "6 Clicks" Daily Activity Outcome Measure  Help from another person eating meals? 4  Help from another person taking care of personal grooming? 3  Help from another person toileting, which includes using toliet, bedpan, or urinal? 3  Help from another person bathing (including washing, rinsing, drying)? 3  Help from another person to put on and taking off regular upper body clothing? 3  Help from another person to put on and taking off regular lower body clothing? 2  6 Click Score 18  ADL G Code Conversion CK  OT Goal Progression  Progress towards OT goals Progressing toward goals  Acute Rehab OT Goals  Time For Goal Achievement 09/03/18  Potential to Achieve Goals Good  OT Time Calculation  OT Start Time (ACUTE ONLY) 0850  OT Stop Time (ACUTE ONLY) 0910  OT Time Calculation (min) 20 min  OT General Charges  $OT Visit 1 Visit  OT Treatments  $Self Care/Home Management  8-22 mins  Lesle Chris, OTR/L Acute Rehabilitation Services [phone] [facility] pager [phone] office 08/21/2018

## 2018-08-21 NOTE — Progress Notes (Signed)
Report given by Hulan Fess, RN. Pt requesting pain medicine, prn given per order. IVF infusing per order. No acute issues during rounds. Pt watching tv with call bell within reach.

## 2018-08-22 ENCOUNTER — Encounter (HOSPITAL_COMMUNITY): Payer: Self-pay

## 2018-08-22 DIAGNOSIS — R509 Fever, unspecified: Secondary | ICD-10-CM

## 2018-08-22 DIAGNOSIS — R531 Weakness: Secondary | ICD-10-CM

## 2018-08-22 LAB — CULTURE, BLOOD (ROUTINE X 2)
Culture: NO GROWTH
Culture: NO GROWTH
SPECIAL REQUESTS: ADEQUATE

## 2018-08-22 LAB — CBC
HEMATOCRIT: 23.6 % — AB (ref 39.0–52.0)
Hemoglobin: 7.4 g/dL — ABNORMAL LOW (ref 13.0–17.0)
MCH: 26 pg (ref 26.0–34.0)
MCHC: 31.4 g/dL (ref 30.0–36.0)
MCV: 82.8 fL (ref 78.0–100.0)
PLATELETS: 139 10*3/uL — AB (ref 150–400)
RBC: 2.85 MIL/uL — ABNORMAL LOW (ref 4.22–5.81)
RDW: 15.9 % — AB (ref 11.5–15.5)
WBC: 2 10*3/uL — ABNORMAL LOW (ref 4.0–10.5)

## 2018-08-22 LAB — IRON AND TIBC
Iron: 26 ug/dL — ABNORMAL LOW (ref 45–182)
Saturation Ratios: 15 % — ABNORMAL LOW (ref 17.9–39.5)
TIBC: 178 ug/dL — AB (ref 250–450)
UIBC: 152 ug/dL

## 2018-08-22 LAB — SAVE SMEAR

## 2018-08-22 LAB — RETICULOCYTES
RBC.: 2.97 MIL/uL — ABNORMAL LOW (ref 4.22–5.81)
Retic Count, Absolute: 38.6 10*3/uL (ref 19.0–186.0)
Retic Ct Pct: 1.3 % (ref 0.4–3.1)

## 2018-08-22 LAB — FERRITIN: FERRITIN: 630 ng/mL — AB (ref 24–336)

## 2018-08-22 LAB — CREATININE, SERUM
CREATININE: 0.56 mg/dL — AB (ref 0.61–1.24)
GFR calc Af Amer: >60 mL/min (ref >60)
GFR calc non Af Amer: >60 mL/min (ref >60)

## 2018-08-22 LAB — VITAMIN B12: VITAMIN B 12: 253 pg/mL (ref 180–914)

## 2018-08-22 MED ORDER — PENICILLIN G BENZATHINE 1200000 UNIT/2ML IM SUSP: 2.4000 10*6.[IU] | INTRAMUSCULAR | 1 refills | Status: DC

## 2018-08-22 MED ORDER — SULFAMETHOXAZOLE-TRIMETHOPRIM 800-160 MG PO TABS: 1.0000 | ORAL_TABLET | Freq: Every day | ORAL | 0 refills | Status: DC

## 2018-08-22 MED ORDER — FLUCONAZOLE 100 MG PO TABS: 100.0000 mg | ORAL_TABLET | ORAL | 1 refills | Status: DC

## 2018-08-22 NOTE — Discharge Summary (Signed)
Discharge Summary  Patrick Huber EOF:[id number] DOB: 09/12/1971  PCP: Patient, No Pcp Per  Admit date: 08/17/2018 Discharge date: 08/22/2018  Time spent: 25 minutes  Recommendations for Outpatient Follow-up:  1. Follow up with ID 2. Follow up with PCP 3. Take your medications as prescribed 4. Continue PT/OT  Discharge Diagnoses:  Active Hospital Problems   Diagnosis Date Noted  . HIV disease ([place]) 08/18/2018  . Syphilis 08/20/2018  . Malnutrition of moderate degree 08/18/2018  . Diarrhea 08/18/2018  . Unintentional weight loss 08/18/2018  . Polyarthralgia 08/18/2018  . Knee mass, right 08/18/2018  . Pancytopenia ([place]) 08/18/2018  . Cigarette smoker 08/18/2018  . GERD (gastroesophageal reflux disease) 08/18/2018  . Rash 08/18/2018  . History of MRSA infection 08/18/2018  . Generalized weakness 08/17/2018    Resolved Hospital Problems  No resolved problems to display.    Discharge Condition: Stable  Diet recommendation: Resume previous diet  Vitals:   08/22/18 0540 08/22/18 1443  BP: 105/66 112/71  Pulse: 80 85  Resp: 18 15  Temp: 98.5 F (36.9 C) 98.2 F (36.8 C)  SpO2: 96% 100%    History of present illness:  Patrick Aschoff Kingis a 47 y.o.malewith medical history significant forright knee mass status post resection in July at [facility], tobacco use disorder who presented from home to [facility] ED due to gradually worsening generalized weakness and severe right knee pain of 3 weeks duration. Reports intermittent chills and subjective fevers,newskin lesions affecting his fingers, toes and scalp. Significant family history of psoriasis and eczema. In July had a mass on his right knee which was removed at [facility]. States since then about 3 weeks ago started having diffusedjoint pain associated with generalized weakness. Went to [facility] and was asked to return to his orthopedic surgeon. States he was unable to make the appointment due to high co-pay. Also  reports diarrhea after eating. As a result has abstainedfrom eatingfor 4 days.  -08/18/18: Diffused severe joint pain rash affecting his fingers toes and scalp.  Increased pain medicatons with some relief.  HIV screening test positive.  Ordered confirmatory tests viral load and CD4.  ID also consulted. -08/19/2018: Diffused joint pain mildly improved with pain medications.  Still feels very weak.  PT to assess.  Encouraged increase p.o. protein calorie intake.  CD4 percentage 3.  Infectious disease following.  Highly appreciated. Will be started on HAART today. -08/20/2018: His joint pains are improved with pain medications.  Started Biktarvy yesterday.  Case manager consulted to assist with medications and discharge needs. -08/21/2018: His symptoms are improving however still feels weak.  08/22/2018: Patient seen and examined at his bedside.  No acute events overnight.  Awaiting HIV medications from infectious disease prior to discharge. Patient would ike to go home.  On the day of discharge, patient was hemodynamically stable. He received 30 day supply of his HIV medication. He will also need to pick up his prophylactic antibiotic and antifungal medications today. Patient understands and agrees to plan.   Hospital Course:  Principal Problem:   HIV disease ([place]) Active Problems:   Generalized weakness   Malnutrition of moderate degree   Diarrhea   Unintentional weight loss   Polyarthralgia   Knee mass, right   Pancytopenia ([facility])   Cigarette smoker   GERD (gastroesophageal reflux disease)   Rash   History of MRSA infection   Syphilis  Improving generalized weakness/physical debility most likely secondary to HIV infection ESR greater than 140 and CRP of 18 HIV1/2antibiotiesreactive-reports 2  lifetime male sexual partners; his separated wife and his girlfriend. 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 Continue IM penicillin 2,400,000 units weekly x3 doses Patient states he informed his wife of his newly diagnosed HIV and syphilis status  Improving severe diffuse joint pain with effusion affecting elbows and knees bilaterally Orthopedic surgery consulted to assess Continue supportive care  Diffuse wounds from syphilis and HIV Affecting right foot, groins, left axilla Continue supportive care Follow up with Infectious Disease  Physical debility/ambulatory dysfunction PT recommends SNF Fall precautions Patient declines SNF Will be discharged home with home health PT/OT after receiving HIV medications from infectious disease  Resolved AKI Baseline creatinine 0.6 Continue to avoid nephrotoxic agents/dehydration/hypotension Monitor urine output  Resolved hypokalemia after repletion  Status post right knee mass resection Procedure done at [facility] in July 2019 X-ray of right knee with small suprapatellar bursa effusion  No sign of fracture or dislocation or infection  Persistent leukopenia and normocytic anemia most likely secondary to HIV infection WBC 2.0 Follow up with ID and PCP Take your HAART medications  Severe protein calorie malnutrition Albumin 2.2 BMI 19 Continue oral supplementation Dietary consult to address nutritional needs  Tobacco use disorder Tobacco cessation counseling done at bedside Nicotine patch  Skin lesions suspect secondary to HIV infection Appearance of severepsoriasisparticularly on the scalp Patient has not seen a dermatologist  Ambulatory dysfunction secondary to knee joint pain bilaterally Fall precautions   Code Status: Full  code    Consultants:  Infectious diseases  Antimicrobials:  Bactrim  Fluconazole  IM penicillin   Discharge Exam: BP 112/71 (BP Location: Left Arm)   Pulse 85   Temp 98.2 F (36.8 C) (Oral)   Resp 15   Ht 6' 1"  (1.854 m)   Wt 68.2 kg   SpO2 100%   BMI 19.84 kg/m  . General: 47 y.o. year-old male frail in no acute distress.  Alert and oriented x3. . Cardiovascular: Regular rate and rhythm with no rubs or gallops.  No thyromegaly or JVD noted.   Marland Kitchen Respiratory: Clear to auscultation with no wheezes or rales. Good inspiratory effort. . Abdomen: Soft nontender nondistended with normal bowel sounds x4 quadrants. . Psychiatry: Mood is appropriate for condition and setting  Discharge Instructions You were cared for by a hospitalist during your hospital stay. If you have any questions about your discharge medications or the care you received while you were in the hospital after you are discharged, you can call the unit and asked to speak with the hospitalist on call if the hospitalist that took care of you is not available. Once you are discharged, your primary care physician will handle any further medical issues. Please note that NO REFILLS for any discharge medications will be authorized once you are discharged, as it is imperative that you return to your primary care physician (or establish a relationship with a primary care physician if you do not have one) for your aftercare needs so that they can reassess your need for medications and monitor your lab values.  Discharge Instructions    Call MD for:  difficulty breathing, headache or visual disturbances   Complete by:  As directed    Call MD for:  hives   Complete by:  As directed    Call MD for:  persistant dizziness or light-headedness   Complete by:  As directed    Call MD for:  redness, tenderness, or signs of infection (pain, swelling, redness, odor or green/yellow discharge around incision site)   Complete by:  As  directed    Call MD for:  severe uncontrolled pain   Complete by:  As directed    Call MD for:  temperature >100.4   Complete by:  As directed    Diet - low sodium heart healthy   Complete by:  As directed    Increase activity slowly   Complete by:  As directed      Allergies as of 08/22/2018      Reactions   Meperidine Anaphylaxis      Medication List    TAKE these medications   aspirin 81 MG tablet Take 81 mg by mouth daily.   bictegravir-emtricitabine-tenofovir AF 50-200-25 MG Tabs tablet Commonly known as:  BIKTARVY Take 1 tablet by mouth daily.   fluconazole 100 MG tablet Commonly known as:  DIFLUCAN Take 1 tablet (100 mg total) by mouth once a week. Start taking on:  08/26/2018   ibuprofen 200 MG tablet Commonly known as:  ADVIL,MOTRIN Take 200 mg by mouth 2 (two) times daily as needed for moderate pain.   loperamide 2 MG capsule Commonly known as:  IMODIUM Take 2 mg by mouth as needed for diarrhea or loose stools.   MUSCLE RUB 10-15 % Crea Apply 1 application topically as needed for muscle pain.   penicillin g benzathine 1200000 UNIT/2ML Susp injection Commonly known as:  BICILLIN LA Inject 4 mLs (2.4 Million Units total) into the muscle once a week. Start taking on:  08/27/2018   sulfamethoxazole-trimethoprim 800-160 MG tablet Commonly known as:  BACTRIM DS,SEPTRA DS Take 1 tablet by mouth daily. Start taking on:  08/23/2018            Durable Medical Equipment  (From admission, onward)         Start     Ordered   08/22/18 1232  For home use only DME 3 n 1  Once     08/22/18 1232         Allergies  Allergen Reactions  . Meperidine Anaphylaxis   Follow-up Information    [facility] Follow up.   Specialty:  Home Health Services Why:  For home health services Contact information: [place] [address] [phone]        Thayer Headings, MD Follow up.   Specialty:  Infectious  Diseases Contact information: [address] [place] [address] [phone]        [facility] Follow up.   Contact information: [address] [place] [address] [phone]           The results of significant diagnostics from this hospitalization (including imaging, microbiology, ancillary and laboratory) are listed below for reference.    Significant Diagnostic Studies: Dg Chest Port 1 View  Result Date: 08/19/2018 CLINICAL DATA:  Increased lung sounds in the basis EXAM: PORTABLE CHEST 1 VIEW COMPARISON:  04/27/2017 FINDINGS: Cardiac shadows within normal limits. The lungs are well aerated bilaterally. No focal infiltrate or sizable effusion is seen. No acute bony abnormality is noted. IMPRESSION: No active disease. Electronically Signed   By: Inez Catalina M.D.   On: 08/19/2018 10:13  Dg Knee Right Port  Result Date: 08/17/2018 CLINICAL DATA:  Knee pain. EXAM: PORTABLE RIGHT KNEE - 1-2 VIEW COMPARISON:  07/27/2018. FINDINGS: No evidence of fracture or dislocation. Small suprapatellar bursa effusion. No signs of infection. Osteopenia. IMPRESSION: No evidence of fracture or dislocation. Small suprapatellar bursa effusion. Similar appearance to priors. Electronically Signed   By: Staci Righter M.D.   On: 08/17/2018 18:57    Microbiology: Recent Results (from the past 240 hour(s))  Blood culture (routine x 2)     Status: None   Collection Time: 08/17/18  3:27 PM  Result Value Ref Range Status   Specimen Description   Final    BLOOD RIGHT HAND Performed at [facility], [place] [address], [place], [place] [address]    Special Requests   Final    BOTTLES DRAWN AEROBIC AND ANAEROBIC Blood Culture adequate volume Performed at [facility] [address] [place], [place] [address]    Culture   Final    NO GROWTH 5 DAYS Performed at [facility], [place] [address] [place], [place] [address]    Report Status 08/22/2018 FINAL  Final  Blood culture (routine x 2)     Status: None   Collection Time: 08/17/18  3:36 PM  Result Value Ref Range Status   Specimen Description   Final    BLOOD RIGHT HAND Performed at [place] [address], [place], [place] [address]    Special Requests   Final    BOTTLES DRAWN AEROBIC AND ANAEROBIC Blood Culture results may not be optimal due to an excessive volume of blood received in culture bottles Performed at [place] [place] [address], [place], [place] [address]    Culture   Final    NO GROWTH 5 DAYS Performed at [facility], [place] [address] [place], [place] [address]    Report Status 08/22/2018 FINAL  Final  Urine culture     Status: Abnormal   Collection Time: 08/18/18  5:50 AM  Result Value Ref Range Status   Specimen Description   Final    URINE, CLEAN CATCH Performed at [facility], [place] [address], [place], [place] [address]    Special Requests   Final    NONE Performed at [facility], [place] [address] [place], [place] [address]    Culture (A)  Final    <10,000 COLONIES/mL INSIGNIFICANT GROWTH Performed at [facility] [place] [address], [place], [place] [address]    Report Status 08/19/2018 FINAL  Final  MRSA PCR Screening     Status: None   Collection Time: 08/18/18  7:20 AM  Result Value Ref Range Status   MRSA by PCR NEGATIVE NEGATIVE Final    Comment:        The GeneXpert MRSA Assay (FDA approved for NASAL specimens only), is one component of a comprehensive MRSA colonization surveillance program. It is not intended to diagnose MRSA infection nor to guide or monitor treatment for MRSA infections. Performed at [facility], [place] [address], [place], [place] [address]   Gastrointestinal Panel by PCR , Stool     Status: None   Collection Time: 08/20/18  8:14 PM  Result Value  Ref Range Status   Campylobacter species NOT DETECTED NOT DETECTED Final   Plesimonas shigelloides NOT DETECTED NOT DETECTED Final   Salmonella species NOT DETECTED NOT DETECTED Final   Yersinia enterocolitica NOT  DETECTED NOT DETECTED Final   Vibrio species NOT DETECTED NOT DETECTED Final   Vibrio cholerae NOT DETECTED NOT DETECTED Final   Enteroaggregative E coli (EAEC) NOT DETECTED NOT DETECTED Final   Enteropathogenic E coli (EPEC) NOT DETECTED NOT DETECTED Final   Enterotoxigenic E coli (ETEC) NOT DETECTED NOT DETECTED Final   Shiga like toxin producing E coli (STEC) NOT DETECTED NOT DETECTED Final   Shigella/Enteroinvasive E coli (EIEC) NOT DETECTED NOT DETECTED Final   Cryptosporidium NOT DETECTED NOT DETECTED Final   Cyclospora cayetanensis NOT DETECTED NOT DETECTED Final   Entamoeba histolytica NOT DETECTED NOT DETECTED Final   Giardia lamblia NOT DETECTED NOT DETECTED Final   Adenovirus F40/41 NOT DETECTED NOT DETECTED Final   Astrovirus NOT DETECTED NOT DETECTED Final   Norovirus GI/GII NOT DETECTED NOT DETECTED Final   Rotavirus A NOT DETECTED NOT DETECTED Final   Sapovirus (I, II, IV, and V) NOT DETECTED NOT DETECTED Final    Comment: Performed at [facility], [place] [place], [place] [address]     Labs: Basic Metabolic Panel: Recent Labs  Lab 08/17/18 1527 08/17/18 1915 08/18/18 0337 08/19/18 0343 08/20/18 0408 08/21/18 0431 08/22/18 0534  NA 137  --  137  --   --   --   --   K 3.8  --  3.4* 3.7  --   --   --   CL 98  --  100  --   --   --   --   CO2 29  --  27  --   --   --   --   GLUCOSE 110*  --  108*  --   --   --   --   BUN 15  --  15  --   --   --   --   CREATININE 0.72  --  0.61 0.55* 0.69 0.63 0.56*  CALCIUM 8.4*  --  8.1*  --   --   --   --   MG  --  2.1  --   --   --   --   --    Liver Function Tests: Recent Labs  Lab 08/17/18 1527  AST 62*  ALT 58*  ALKPHOS 168*  BILITOT 1.0  PROT 8.7*  ALBUMIN 2.2*   Recent Labs   Lab 08/17/18 1527  LIPASE 24   No results for input(s): AMMONIA in the last 168 hours. CBC: Recent Labs  Lab 08/17/18 1527 08/18/18 0337 08/19/18 0343 08/20/18 0408 08/22/18 0534  WBC 3.1* 2.6* 2.5* 2.3* 2.0*  NEUTROABS 2.3  --   --   --   --   HGB 9.4* 8.3* 8.1* 8.0* 7.4*  HCT 30.1* 27.1* 26.4* 25.2* 23.6*  MCV 82.5 83.6 84.3 83.2 82.8  PLT 149* 161 148* 140* 139*   Cardiac Enzymes: Recent Labs  Lab 08/17/18 1915  CKTOTAL 21*   BNP: BNP (last 3 results) No results for input(s): BNP in the last 8760 hours.  ProBNP (last 3 results) No results for input(s): PROBNP in the last 8760 hours.  CBG: No results for input(s): GLUCAP in the last 168 hours.     Signed:  Kayleen Memos, MD [facility] 08/22/2018, 7:46 PM

## 2018-08-22 NOTE — Progress Notes (Addendum)
Discharge Planning: pt will not be able to dc without HIV meds. HIV meds are not covered under [facility] due to cost. Pt will need to be able to pick up from [facility] when they are available. Will be able to use [facility] for his other medications if they do not exceed $500. Pt received 3n1 bedside commode.    Jonnie Finner RN CCM Case Mgmt phone [phone]

## 2018-08-22 NOTE — Progress Notes (Signed)
PROGRESS NOTE  Patrick Huber OPF:[id number] DOB: 04-02-1971 DOA: 08/17/2018 PCP: Patient, No Pcp Per  HPI/Recap of past 24 hours: Patrick Huber is a 47 y.o. male with medical history significant for right knee mass status post resection in July at [facility], tobacco use disorder who presented from home to [facility] ED due to gradually worsening generalized weakness and severe right knee pain of 3 weeks duration.  Reports intermittent chills and subjective fevers, new skin lesions affecting his fingers, toes and scalp.  Significant family history of psoriasis and eczema.  In July had a mass on his right knee which was removed at [facility].  States since then about 3 weeks ago started having diffused joint pain associated with generalized weakness.  Went to [employer] and was asked to return to his orthopedic surgeon.  States he was unable to make the appointment due to high co-pay.  Also reports diarrhea after eating.  As a result has abstained from eating for 4 days.  -08/18/18: Diffused severe joint pain rash affecting his fingers toes and scalp.  Increased pain medicatons with some relief.  HIV screening test positive.  Ordered confirmatory tests viral load and CD4.  ID also consulted. -08/19/2018: Diffused joint pain mildly improved with pain medications.  Still feels very weak.  PT to assess.  Encouraged increase p.o. protein calorie intake.  CD4 percentage 3.  Infectious disease following.  Highly appreciated. Will be started on HAART today. -08/20/2018: His joint pains are improved with pain medications.  Started Biktarvy yesterday.  Case manager consulted to assist with medications and discharge needs. -08/21/2018: His symptoms are improving however still feels weak.  08/22/2018: Patient seen and examined at his bedside.  No acute events overnight.  Awaiting HIV medications from infectious disease prior to discharge.  Wound care specialist to give recommendations for his ulcerative lesions from  syphilis.    Assessment/Plan: Principal Problem:   HIV disease ([facility]) Active Problems:   Generalized weakness   Malnutrition of moderate degree   Diarrhea   Unintentional weight loss   Polyarthralgia   Knee mass, right   Pancytopenia (HCC)   Cigarette smoker   GERD (gastroesophageal reflux disease)   Rash   History of MRSA infection   Syphilis  Improving generalized weakness/physical debility most likely secondary to HIV infection ESR greater than 140 and CRP of 18 HIV 1/2 antibioties reactive-reports 2 lifetime male sexual partners; his separated wife and his girlfriend. 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 Continue IM penicillin 2,400,000 units weekly x3 doses Patient states he informed his wife of his newly diagnosed HIV and syphilis status  Improving severe diffuse joint pain with effusion affecting elbows and knees bilaterally Orthopedic surgery consulted to assess Continue supportive care  Diffuse wounds from syphilis and HIV Affecting right foot, groins, left axilla Wound care specialist consulted for recommendations Continue supportive care  Physical debility/ambulatory dysfunction PT recommends SNF Fall precautions Patient declines SNF Will be discharged home with home health PT/OT after receiving HIV medications from infectious disease  Resolved AKI Baseline creatinine 0.6 Continue to avoid nephrotoxic agents/dehydration/hypotension Monitor urine output  Resolved hypokalemia after repletion  Status post right knee mass resection Procedure done at [facility] in July 2019  X-ray of right knee with small suprapatellar bursa  effusion  No sign of fracture or dislocation or infection  Persistent leukopenia and normocytic anemia most likely secondary to HIV infection WBC 2.0 Obtain CBC in the morning  Severe protein calorie malnutrition Albumin 2.2 BMI 19 Continue oral supplementation Dietary consult to address nutritional needs  Tobacco use disorder Tobacco cessation counseling done at bedside Nicotine patch  Skin lesions suspect secondary to HIV infection Appearance of severe psoriasis particularly on the scalp Patient has not seen a dermatologist  Ambulatory dysfunction secondary to severe knee joint pain bilaterally Fall precautions PT to assess in the morning  Risks: Patient is a high risk for decompensation due to newly diagnosed HIV infection, newly diagnosed syphilis, severely elevated inflammatory markers and severe diffuse joint pain.   Code Status: Full code  Family Communication: None at bedside  Disposition Plan: Home in 1 to 2 days or when infectious disease signs off.   Consultants:  Infectious disease  Procedures:  None  Antimicrobials:  Bactrim  Fluconazole  IM penicillin  DVT prophylaxis: Subcu Lovenox   Objective: Vitals:   08/21/18 0445 08/21/18 1425 08/21/18 2228 08/22/18 0540  BP: 110/62 115/68 110/63 105/66  Pulse: 90 86 79 80  Resp: _0 Temp: 98 F (36.7 C) 98.8 F (37.1 C) 98.7 F (37.1 C) 98.5 F (36.9 C)  TempSrc: Oral Oral Oral Oral  SpO2: 97% 99% 97% 96%  Weight:      Height:        Intake/Output Summary (Last 24 hours) at 08/22/2018 1244 Last data filed at 08/22/2018 0935 Gross per 24 hour  Intake 2665.45 ml  Output 2325 ml  Net 340.45 ml   Filed Weights   08/17/18 1625 08/17/18 2143  Weight: 68 kg 68.2 kg    Exam:  . General: 47 y.o. year-old male frail in no acute distress.  Alert and oriented x3. . Cardiovascular: Regular rate and rhythm with no rubs or gallops.  No JVD or thyromegaly noted. Marland Kitchen Respiratory:  Clear to auscultation with no wheezes or rales.  Good inspiratory effort. . Abdomen: Soft nontender nondistended with normal bowel sounds x4 quadrants. . Musculoskeletal: Knees and elbows appear inflamed with effusion bilaterally. . Skin: Severe psoriatic appearing lesions affecting scalp toes and fingers.  Another nontender lesion on his left arm, looks like syphilis rash. Marland Kitchen Psychiatry: Mood is appropriate for condition and setting   Data Reviewed: CBC: Recent Labs  Lab 08/17/18 1527 08/18/18 0337 08/19/18 0343 08/20/18 0408 08/22/18 0534  WBC 3.1* 2.6* 2.5* 2.3* 2.0*  NEUTROABS 2.3  --   --   --   --   HGB 9.4* 8.3* 8.1* 8.0* 7.4*  HCT 30.1* 27.1* 26.4* 25.2* 23.6*  MCV 82.5 83.6 84.3 83.2 82.8  PLT 149* 161 148* 140* 349*   Basic Metabolic Panel: Recent Labs  Lab 08/17/18 1527 08/17/18 1915 08/18/18 0337 08/19/18 0343 08/20/18 0408 08/21/18 0431 08/22/18 0534  NA 137  --  137  --   --   --   --   K 3.8  --  3.4* 3.7  --   --   --   CL 98  --  100  --   --   --   --   CO2 29  --  27  --   --   --   --   GLUCOSE 110*  --  108*  --   --   --   --  BUN 15  --  15  --   --   --   --   CREATININE 0.72  --  0.61 0.55* 0.69 0.63 0.56*  CALCIUM 8.4*  --  8.1*  --   --   --   --   MG  --  2.1  --   --   --   --   --    GFR: Estimated Creatinine Clearance: 111.3 mL/min (A) (by C-G formula based on SCr of 0.56 mg/dL (L)). Liver Function Tests: Recent Labs  Lab 08/17/18 1527  AST 62*  ALT 58*  ALKPHOS 168*  BILITOT 1.0  PROT 8.7*  ALBUMIN 2.2*   Recent Labs  Lab 08/17/18 1527  LIPASE 24   No results for input(s): AMMONIA in the last 168 hours. Coagulation Profile: No results for input(s): INR, PROTIME in the last 168 hours. Cardiac Enzymes: Recent Labs  Lab 08/17/18 1915  CKTOTAL 21*   BNP (last 3 results) No results for input(s): PROBNP in the last 8760 hours. HbA1C: No results for input(s): HGBA1C in the last 72 hours. CBG: No results for input(s):  GLUCAP in the last 168 hours. Lipid Profile: No results for input(s): CHOL, HDL, LDLCALC, TRIG, CHOLHDL, LDLDIRECT in the last 72 hours. Thyroid Function Tests: No results for input(s): TSH, T4TOTAL, FREET4, T3FREE, THYROIDAB in the last 72 hours. Anemia Panel: Recent Labs    08/22/18 0750  VITAMINB12 253  FERRITIN 630*  TIBC 178*  IRON 26*  RETICCTPCT 1.3   Urine analysis:    Component Value Date/Time   COLORURINE AMBER (A) 08/18/2018 0550   APPEARANCEUR HAZY (A) 08/18/2018 0550   LABSPEC 1.029 08/18/2018 0550   PHURINE 5.0 08/18/2018 0550   GLUCOSEU NEGATIVE 08/18/2018 0550   HGBUR SMALL (A) 08/18/2018 0550   BILIRUBINUR NEGATIVE 08/18/2018 0550   KETONESUR NEGATIVE 08/18/2018 0550   PROTEINUR 100 (A) 08/18/2018 0550   NITRITE NEGATIVE 08/18/2018 0550   LEUKOCYTESUR NEGATIVE 08/18/2018 0550   Sepsis Labs: _0 (procalcitonin:4,lacticidven:4)  ) Recent Results (from the past 240 hour(s))  Blood culture (routine x 2)     Status: None   Collection Time: 08/17/18  3:27 PM  Result Value Ref Range Status   Specimen Description   Final    BLOOD RIGHT HAND Performed at [facility], [place] [address], [place], [place] [address]    Special Requests   Final    BOTTLES DRAWN AEROBIC AND ANAEROBIC Blood Culture adequate volume Performed at [place] [address], [place], [place] [address]    Culture   Final    NO GROWTH 5 DAYS Performed at [facility], [place] [address], [place], [place] [address]    Report Status 08/22/2018 FINAL  Final  Blood culture (routine x 2)     Status: None   Collection Time: 08/17/18  3:36 PM  Result Value Ref Range Status   Specimen Description   Final    BLOOD RIGHT HAND Performed at [place] [address] [place], [place] [address]    Special Requests   Final    BOTTLES DRAWN AEROBIC AND ANAEROBIC Blood Culture results may not be optimal due to an excessive  volume of blood received in culture bottles Performed at [place] [address] [place], [place] [address]    Culture   Final    NO GROWTH 5 DAYS Performed at [facility], [place] [address], [place], [place]  [address]    Report Status 08/22/2018 FINAL  Final  Urine culture     Status: Abnormal   Collection Time: 08/18/18  5:50 AM  Result Value Ref Range Status   Specimen Description   Final    URINE, CLEAN CATCH Performed at [facility], [place] [address], [place], [place] [address]    Special Requests   Final    NONE Performed at [facility], [place] [address], [place], [place] [address]    Culture (A)  Final    <10,000 COLONIES/mL INSIGNIFICANT GROWTH Performed at [place] [address], [place], [place] [address]    Report Status 08/19/2018 FINAL  Final  MRSA PCR Screening     Status: None   Collection Time: 08/18/18  7:20 AM  Result Value Ref Range Status   MRSA by PCR NEGATIVE NEGATIVE Final    Comment:        The GeneXpert MRSA Assay (FDA approved for NASAL specimens only), is one component of a comprehensive MRSA colonization surveillance program. It is not intended to diagnose MRSA infection nor to guide or monitor treatment for MRSA infections. Performed at [facility], [place] [address] [place], [place] [address]   Gastrointestinal Panel by PCR , Stool     Status: None   Collection Time: 08/20/18  8:14 PM  Result Value Ref Range Status   Campylobacter species NOT DETECTED NOT DETECTED Final   Plesimonas shigelloides NOT DETECTED NOT DETECTED Final   Salmonella species NOT DETECTED NOT DETECTED Final   Yersinia enterocolitica NOT DETECTED NOT DETECTED Final   Vibrio species NOT DETECTED NOT DETECTED Final   Vibrio cholerae NOT DETECTED NOT DETECTED Final   Enteroaggregative E coli (EAEC) NOT DETECTED NOT DETECTED Final   Enteropathogenic E coli (EPEC) NOT DETECTED  NOT DETECTED Final   Enterotoxigenic E coli (ETEC) NOT DETECTED NOT DETECTED Final   Shiga like toxin producing E coli (STEC) NOT DETECTED NOT DETECTED Final   Shigella/Enteroinvasive E coli (EIEC) NOT DETECTED NOT DETECTED Final   Cryptosporidium NOT DETECTED NOT DETECTED Final   Cyclospora cayetanensis NOT DETECTED NOT DETECTED Final   Entamoeba histolytica NOT DETECTED NOT DETECTED Final   Giardia lamblia NOT DETECTED NOT DETECTED Final   Adenovirus F40/41 NOT DETECTED NOT DETECTED Final   Astrovirus NOT DETECTED NOT DETECTED Final   Norovirus GI/GII NOT DETECTED NOT DETECTED Final   Rotavirus A NOT DETECTED NOT DETECTED Final   Sapovirus (I, II, IV, and V) NOT DETECTED NOT DETECTED Final    Comment: Performed at [facility], [address] [place], [place] [address]      Studies: No results found.  Scheduled Meds: . bictegravir-emtricitabine-tenofovir AF  1 tablet Oral Daily  . enoxaparin (LOVENOX) injection  40 mg Subcutaneous Q24H  . feeding supplement (ENSURE ENLIVE)  237 mL Oral BID BM  . fluconazole  100 mg Oral Weekly  . penicillin g benzathine (BICILLIN-LA) IM  2.4 Million Units Intramuscular Weekly  . sulfamethoxazole-trimethoprim  1 tablet Oral Daily    Continuous Infusions:    LOS: 5 days     Kayleen Memos, MD [facility] Pager [phone]  If 7PM-7AM, please contact night-coverage [email or web address] Password [facility] 08/22/2018, 12:44 PM

## 2018-08-23 LAB — ACID FAST SMEAR (AFB): ACID FAST SMEAR - AFSCU2: NEGATIVE

## 2018-08-23 NOTE — Care Management (Addendum)
NCM received call from pt and pt's wife, Korban Shearer. Stating they could not afford the Penicillin G IM injections. Contacted [facility] and they injections would run close to 1200 and the Rx needed to be clarified. Contacted attending to update. Contacted ID attending, Dr Linus Salmons. Pt will receive other 2 IM injections of Penicillin in the office. Pt will need to call office to arrange appt. Made attending aware. Jonnie Finner RN CCM Case Mgmt phone [phone]  08/23/2018 Contacted pt and gave permission to speak to wife. Updated wife that she would need to call on Monday to arrange appt at Dr Comer's office to set up Penicillin IM injections. Explained Diflucan and Bactrim are $12 each at the pharmacy. Jonnie Finner RN CCM Case Mgmt phone [phone]

## 2018-08-24 LAB — QUANTIFERON-TB GOLD PLUS (RQFGPL)
QUANTIFERON TB1 AG VALUE: 0.09 [IU]/mL
QUANTIFERON TB2 AG VALUE: 0.09 [IU]/mL
QuantiFERON Mitogen Value: 0.13 IU/mL
QuantiFERON Nil Value: 0.12 IU/mL

## 2018-08-24 LAB — FOLATE RBC
Folate, Hemolysate: 258.1 ng/mL
Folate, RBC: 1028 ng/mL
Hematocrit: 25.1 % — ABNORMAL LOW (ref 37.5–51.0)

## 2018-08-24 LAB — QUANTIFERON-TB GOLD PLUS: QuantiFERON-TB Gold Plus: UNDETERMINED

## 2018-08-25 ENCOUNTER — Telehealth: Payer: Self-pay | Admitting: *Deleted

## 2018-08-25 NOTE — Telephone Encounter (Signed)
Nurse from [facility] called to advise that the patient was assessed and they did not find there was a need for home health nursing at this time.

## 2018-08-26 LAB — HLA B*5701: HLA B 5701: NEGATIVE

## 2018-08-26 NOTE — Telephone Encounter (Signed)
Patient is approved to receive Biktarvy through [employer] patient assistance program until 08/20/19 or until [facility] is approved.

## 2018-09-02 ENCOUNTER — Telehealth: Payer: Self-pay | Admitting: Internal Medicine

## 2018-09-02 NOTE — Telephone Encounter (Signed)
[facility] called to let us know that they do not have any prescriptions on file.

## 2018-09-03 ENCOUNTER — Other Ambulatory Visit: Payer: Self-pay | Admitting: Behavioral Health

## 2018-09-03 ENCOUNTER — Ambulatory Visit: Payer: Medicaid Other | Attending: Family Medicine | Admitting: Physician Assistant

## 2018-09-03 VITALS — BP 114/78 | HR 121 | Temp 98.9°F | Resp 18 | Ht 73.0 in | Wt 146.0 lb

## 2018-09-03 DIAGNOSIS — E46 Unspecified protein-calorie malnutrition: Secondary | ICD-10-CM | POA: Diagnosis not present

## 2018-09-03 DIAGNOSIS — Z7982 Long term (current) use of aspirin: Secondary | ICD-10-CM | POA: Insufficient documentation

## 2018-09-03 DIAGNOSIS — Z79899 Other long term (current) drug therapy: Secondary | ICD-10-CM | POA: Diagnosis not present

## 2018-09-03 DIAGNOSIS — M25561 Pain in right knee: Secondary | ICD-10-CM | POA: Diagnosis not present

## 2018-09-03 DIAGNOSIS — M25562 Pain in left knee: Secondary | ICD-10-CM | POA: Insufficient documentation

## 2018-09-03 DIAGNOSIS — Z09 Encounter for follow-up examination after completed treatment for conditions other than malignant neoplasm: Secondary | ICD-10-CM

## 2018-09-03 DIAGNOSIS — M2550 Pain in unspecified joint: Secondary | ICD-10-CM

## 2018-09-03 DIAGNOSIS — B20 Human immunodeficiency virus [HIV] disease: Secondary | ICD-10-CM | POA: Diagnosis not present

## 2018-09-03 DIAGNOSIS — R000 Tachycardia, unspecified: Secondary | ICD-10-CM | POA: Diagnosis not present

## 2018-09-03 DIAGNOSIS — A539 Syphilis, unspecified: Secondary | ICD-10-CM

## 2018-09-03 MED ORDER — ACETAMINOPHEN-CODEINE #3 300-30 MG PO TABS: 1.0000 | ORAL_TABLET | ORAL | 0 refills | Status: DC | PRN

## 2018-09-03 MED ORDER — PENICILLIN G BENZATHINE 2400000 UNIT/4ML IM SUSP
2.4000 10*6.[IU] | Freq: Once | INTRAMUSCULAR | Status: AC
  Administered 2018-09-03: 2400000 [IU] via INTRAMUSCULAR

## 2018-09-03 MED ORDER — BICTEGRAVIR-EMTRICITAB-TENOFOV 50-200-25 MG PO TABS: 1.0000 | ORAL_TABLET | Freq: Every day | ORAL | 2 refills | Status: DC

## 2018-09-03 NOTE — Progress Notes (Signed)
Patient ID: Patrick Huber, male   DOB: [birth year], 47 y.o.   MRN: [id number]      Patrick Huber, is a 47 y.o. male  VVZ:[id number]  [id number]:[id number]  DOB - 15-Nov-1971  Subjective:  Chief Complaint and HPI: Patrick Huber is a 47 y.o. male here today to establish care and for a follow up visit After being hospitalized for newly diagnosed with HIV and syphilis.9/16-9/21/2019:  Here with ex-wife of 2 years. He had 2nd dose of [facility]N at [facility] department and due for 3rd dose this week.  Feels good/better overall today other than knee joint and B foot pain that is ongoing.  He had some tramadol at home he took that didn't help the pain.  He has had no fever.  Appetite is good now and drinking liquids well.  Does not have f/up appt with ID scheduled yet.  Starting to have fragile nails from HIV meds(was told it might be a side effect).  No further diarrhea or vomiting.    From discharge summary:  History of present illness:  Patrick Swander Kingis a 47 y.o.malewith medical history significant forright knee mass status post resection in July at [facility], tobacco use disorder who presented from home to [facility] ED due to gradually worsening generalized weakness and severe right knee pain of 3 weeks duration. Reports intermittent chills and subjective fevers,newskin lesions affecting his fingers, toes and scalp. Significant family history of psoriasis and eczema. In July had a mass on his right knee which was removed at [facility]. States since then about 3 weeks ago started having diffusedjoint pain associated with generalized weakness. Went to [facility] and was asked to return to his orthopedic surgeon. States he was unable to make the appointment due to high co-pay. Also reports diarrhea after eating. As a result has abstainedfrom eatingfor 4 days.  -08/18/18:Diffused severe joint pain rash affecting his fingers toes and scalp. Increased pain medicatons with some relief. HIV screening test positive.  Ordered confirmatory tests viral load and CD4. ID also consulted. -08/19/2018: Diffused joint pain mildly improved with pain medications. Still feels very weak. PT to assess. Encouraged increase p.o. protein calorie intake. CD4 percentage 3. Infectious disease following. Highly appreciated. Will be started on HAART today. -08/20/2018:His joint pains are improved with pain medications. Started Biktarvy yesterday. Case manager consulted to assist with medications and discharge needs. -08/21/2018:His symptoms are improving however still feels weak.  08/22/2018: Patient seen and examined at his bedside. No acute events overnight. Awaiting HIV medications from infectious disease prior to discharge. Patient would ike to go home.  On the day of discharge, patient was hemodynamically stable. He received 30 day supply of his HIV medication. He will also need to pick up his prophylactic antibiotic and antifungal medications today. Patient understands and agrees to plan.   Hospital Course:  Principal Problem:   HIV disease ([place]) Active Problems:   Generalized weakness   Malnutrition of moderate degree   Diarrhea   Unintentional weight loss   Polyarthralgia   Knee mass, right   Pancytopenia (HCC)   Cigarette smoker   GERD (gastroesophageal reflux disease)   Rash   History of MRSA infection   Syphilis  Improving generalized weakness/physical debility most likely secondary to HIV infection ESR greater than 140 and CRP of 18 HIV1/2antibiotiesreactive-reports 2 lifetime male sexual partners; his separated wife and his girlfriend. 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 Continue IM penicillin 2,400,000 units weekly x3 doses Patient states he informed his wife of his newly diagnosed HIV and syphilis status  Improving severe diffuse joint pain with effusion affecting elbows and knees bilaterally Orthopedic surgery consulted to assess Continue supportive care  Diffuse wounds from syphilis and HIV Affecting right foot, groins, left axilla Continue supportive care Follow up with Infectious Disease  Physical debility/ambulatory dysfunction PT recommends SNF Fall precautions Patient declines SNF Will be discharged home with home health PT/OT after receiving HIV medications from infectious disease  Resolved AKI Baseline creatinine 0.6 Continue to avoid nephrotoxic agents/dehydration/hypotension Monitor urine output  Resolved hypokalemia after repletion  Status post right knee mass resection Procedure done at [facility] in July 2019 X-ray of right knee with small suprapatellar bursa effusion  No sign of fracture or dislocation or infection  Persistentleukopenia and normocytic anemia most likely secondary to HIV infection WBC 2.0 Follow up with ID and PCP Take your HAART medications  Severe protein calorie malnutrition Albumin 2.2 BMI 19 Continue oral supplementation Dietary consult to address nutritional needs  Tobacco use disorder Tobacco cessation counseling done at bedside Nicotine patch  Skin lesions suspect secondary to HIV infection Appearance of severepsoriasisparticularly on the scalp Patient has not seen a dermatologist  Ambulatory dysfunction secondary to knee joint pain bilaterally Fall precautions  ED/Hospital notes reviewed.   Social History: Family history:  ROS:   Constitutional:  No f/c, No night sweats,+ weight loss. EENT:  No vision changes, No blurry vision, No hearing changes. No mouth, throat, or ear problems.  Respiratory: No cough, No SOB Cardiac: No CP, no  palpitations GI:  No abd pain, No N/V/D. GU: No Urinary s/sx Musculoskeletal: + joint pain Neuro: No headache, no dizziness, no motor weakness.  Skin: No rash Endocrine:  No polydipsia. No polyuria.  Psych: Denies SI/HI  No problems updated.  ALLERGIES: Allergies  Allergen Reactions  . Meperidine Anaphylaxis    PAST MEDICAL HISTORY: Past Medical History:  Diagnosis Date  . HIV (human immunodeficiency virus infection) ([place])     MEDICATIONS AT HOME: Prior to Admission medications   Medication Sig Start Date End Date Taking? Authorizing Provider  aspirin 81 MG tablet Take 81 mg by mouth daily.   Yes [provider]  bictegravir-emtricitabine-tenofovir AF (BIKTARVY) 50-200-25 MG TABS tablet Take 1 tablet by mouth daily. 08/20/18  Yes Kuppelweiser, Cassie L, RPH-CPP  fluconazole (DIFLUCAN) 100 MG tablet Take 1 tablet (100 mg total) by mouth once a week. 08/26/18  Yes Georgette Shell, MD  penicillin g benzathine (BICILLIN LA) 1200000 UNIT/2ML SUSP injection Inject 4 mLs (2.4 Million Units total) into the muscle once a week. 08/27/18  Yes Georgette Shell, MD  sulfamethoxazole-trimethoprim (BACTRIM DS,SEPTRA DS) 800-160 MG tablet Take 1 tablet by mouth daily. 08/23/18  Yes Georgette Shell, MD  acetaminophen-codeine (TYLENOL #3) 300-30 MG tablet Take 1 tablet by mouth every 4 (four) hours as needed for moderate pain. 09/03/18   Argentina Donovan, PA-C  ibuprofen (ADVIL,MOTRIN) 200 MG tablet Take 200 mg by mouth 2 (two) times daily as needed for moderate pain.    [provider]  loperamide (IMODIUM) 2 MG capsule Take 2 mg by mouth as needed for diarrhea or loose stools.    [provider]  Menthol-Methyl Salicylate (MUSCLE RUB) 10-15 % CREA  Apply 1 application topically as needed for muscle pain.    [provider]     Objective:  EXAM:   Vitals:   09/03/18 1407  BP: 114/78  Pulse: (!) 121  Resp: 18  Temp: 98.9 F (37.2 C)  TempSrc:  Oral  SpO2: 96%  Weight: 146 lb (66.2 kg)  Height: _0  (1.854 m)    General appearance : A&OX3. NAD. Non-toxic-appearing, cachectic and malnourished appearing.  Appears to be in poor health.  Walking with a walker HEENT: Atraumatic and Normocephalic.  PERRLA. EOM intact.   Mouth-MMM, post pharynx WNL w/o erythema, No PND. Neck: supple, no JVD. No cervical lymphadenopathy. No thyromegaly Chest/Lungs:  Breathing-non-labored, Good air entry bilaterally, breath sounds normal without rales, rhonchi, or wheezing  CVS: S1 S2 regular, no murmurs, gallops, rubs.  Pulse at 98 on exam  Abdomen: Bowel sounds present, Non tender and not distended with no gaurding, rigidity or rebound. Extremities: both legs are warm to touch with = pulse throughout. B feet swollen and nails splitting.  Neurology:  CN II-XII grossly intact, Non focal.   Psych:  TP linear. J/I fair. Normal speech. Appropriate eye contact and affect.   Data Review No results found for: HGBA1C   Assessment & Plan   1. HIV disease ([place]) Continue current regimen.  HAART was initiated inpatient and prophylactic antibiotics.   - Ambulatory referral to Infectious Disease  2. Polyarthralgia Tylenol #3 #30 given  3. Syphilis 3rd dose penicillin today - Ambulatory referral to Infectious Disease - Penicillin G Benzathine SUSP 2,400,000 Units  4. Tachycardia Improved in office-hydration and proper diet discussed  5. Hospital discharge follow-up improving  6. Malnutrition, unspecified type [facility]) Proper diet/hydration     Patient have been counseled extensively about nutrition and exercise  Return in about 3 weeks (around 09/24/2018) for assign PCP; conditions comorbid to HIV.  The patient was given clear instructions to go to ER or return to medical center if symptoms don't improve, worsen or new problems develop. The patient verbalized understanding. The patient was told to call to get lab results if they haven't heard  anything in the next week.     Freeman Caldron, PA-C [facility], [place]   09/03/2018, 2:37 PM

## 2018-09-03 NOTE — Patient Instructions (Signed)
Malnutrition Malnutrition is any condition in which nutrition is poor. There are many forms of malnutrition. A common form is having too little of one kind of nutrient (nutritional deficiency). Nutrients include proteins, minerals, carbohydrates, fats, and vitamins. They provide the body with energy and keep the body working normally. Malnutrition ranges from mild to severe. The condition affects the body's defense system (immune system). Because of this, people who are malnourished are more likely to develop health problems and get sick. What are the causes? Causes of malnutrition include:  Eating an unbalanced diet.  Eating too much of certain foods.  Eating too little.  Conditions that decrease the body's ability to use nutrients.  What increases the risk? Risk factors include:  Pregnancy and lactation. Women who are pregnant may become malnourished if they do not increase their nutrient intake. They are also susceptible to folic acid deficiency.  Increasing age. The body's ability to absorb nutrients decreases with age. This can contribute to iron, calcium, and vitamin D deficiencies.  Alcohol or drug dependency. Addiction often leads to a lifestyle in which proper nourishment is ignored. Dependency can also hurt the metabolism and the body's ability to absorb nutrients. Alcoholism is a major cause of thiamine deficiency and can lead to deficiencies of magnesium, zinc, and other vitamins.  Eating disorders, such as anorexia nervosa. People with these disorders may eat too little or too much.  Chewing or swallowing problems. People with these disorders may not eat enough.  Certain diseases, including: ? Long-lasting (chronic) diseases. Chronic diseases tend to affect the absorption of calcium, iron, and vitamins B12, A, D, E, and K. ? Liver disease. Liver disease affects the storage of vitamins A and B12. It also interferes with the metabolism of protein and energy sources. ? Kidney  disease. Kidney disease may cause deficiencies of protein, iron, and vitamin D. ? Cancer or AIDS. These diseases can cause a loss of appetite. ? Cystic fibrosis. This disease can make it difficult for the body to absorb nutrients.  Certain diets, including. ? The vegetarian diet. Vegetarians are at risk for iron deficiency. ? The vegan diet. Vegans are susceptible to vitamin B12, calcium, iron, vitamin D, and zinc deficiencies. ? The fruitarian diet. This diet can be deficient in protein, sodium, and many micronutrients. ? Many commercial "fad" diets, including those that claim to enhance well-being and reduce weight. ? Very low calorie diets.  Low income. People with a low income may have trouble paying for nutritious foods.  What are the signs or symptoms? Signs and symptoms depend on the kind of malnutrition you have. Common symptoms include:  Fatigue.  Weakness.  Dizziness.  Fainting  Weight loss.  Poor immune response.  Lack of menstruation.  Hair loss.  Poor memory.  How is this diagnosed? Malnutrition may be diagnosed by:  A medical history.  A dietary history.  A physical exam. This may include a measurement of your body mass index (BMI).  Blood tests.  How is this treated? Treatments vary depending on the cause of the malnutrition. Common treatments include:  Dietary changes.  Dietary supplements, such as vitamins and minerals.  Treatment of any underlying conditions.  Follow these instructions at home:  Eat a balanced diet.  Take dietary supplements as directed by your health care provider.  Exercise regularly. Exercising can improve appetite.  Keep all follow-up visits as directed by your health care provider. This is important. How is this prevented? Eating a well-balanced diet helps to prevent most forms   of malnutrition. Contact a health care provider if:  You have increased weakness or fatigue.  You faint.  You stop  menstruating.  You have rapid hair loss.  You have unexpected weight loss. This information is not intended to replace advice given to you by your health care provider. Make sure you discuss any questions you have with your health care provider. Document Released: 10/04/2005 Document Revised: 04/25/2016 Document Reviewed: 07/15/2014 Elsevier Interactive Patient Education  2018 [employer]  

## 2018-09-03 NOTE — Progress Notes (Signed)
Flu shot: no Pain: Knees and ankle, 2wks, 10 pins and needles both feet swelling bad

## 2018-09-07 ENCOUNTER — Encounter: Payer: Self-pay | Admitting: Family

## 2018-09-07 ENCOUNTER — Ambulatory Visit (INDEPENDENT_AMBULATORY_CARE_PROVIDER_SITE_OTHER): Payer: Self-pay | Admitting: Family

## 2018-09-07 ENCOUNTER — Other Ambulatory Visit: Payer: Self-pay | Admitting: Family

## 2018-09-07 VITALS — BP 107/74 | HR 125 | Temp 97.9°F | Ht 73.0 in | Wt 146.0 lb

## 2018-09-07 DIAGNOSIS — R600 Localized edema: Secondary | ICD-10-CM

## 2018-09-07 DIAGNOSIS — A539 Syphilis, unspecified: Secondary | ICD-10-CM

## 2018-09-07 DIAGNOSIS — B20 Human immunodeficiency virus [HIV] disease: Secondary | ICD-10-CM

## 2018-09-07 DIAGNOSIS — R2241 Localized swelling, mass and lump, right lower limb: Secondary | ICD-10-CM

## 2018-09-07 NOTE — Patient Instructions (Signed)
Nice to meet you.  Continue to take your Biktarvy and Bactrim as prescribed.  We will check your blood work today.   For your knees - recommend continued ice and elevation with compression sleeves or ACE wraps around the legs to help with the swelling  Plan for office follow up in 1 month or sooner

## 2018-09-07 NOTE — Progress Notes (Signed)
Subjective:    Patient ID: Patrick Huber, male    DOB: 05-11-1971, 47 y.o.   MRN: [id number]  Chief Complaint  Patient presents with  . HIV Positive/AIDS    HPI:  [name] is a 47 y.o. male who presents today for initial office visit following hospitalization and newly diagnosed HIV.    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 Gram stain and culture were negative. In April he was seen at [facility] with concerns for pre-patellar bursitis, however MRI showed a large lesion on the medial patellar facet with marrow edema and bony destruction. Biopsy performed on 05/01/18 showed synovitis with edema and chronic inflammation. Gram stain with gram positive cocci and cultures were negative. A secondary biosy was performed on 06/19/18 with cultures growing MSSA.  He was started on oral Bactrim which he has been on since his most recent admission. He continued to have right knee pain and also developed watery diarrhea. Noted to have lost 60 pounds. On follow up with Orthopedics he was directed to to go the ED and seen in [facility] and once again arthrocentesis showed 2977 WBC, 8% segmented neutrophils, and 79% other cells. Cultures were once again negative.   He progressively worsened to the point he had difficulty getting out of bed with continued weight loss. In the ED he had fever of 100.6 and pancytopenia. He was started on broad spectrum antibiotics. HIV antibody screen was postive and was confirmed with HIV-1. Initial viral load was determined to be 66,000 with a CD4 count of 20. There was no known significant risk factors for acquiring HIV. He was started on Biktarvy, Bactrim and fluconzaole. His RPR was positive with titer of 1:32  and he completed 3 weekly injections of Bicillin. Hepatitis C was negative. He is approved for [employer] and is awaiting UMAP coverage. Drug assistance was obtained by the pharmacy staff until 08/20/2019 pending his UMAP approval. All hospital records, labs and imaging were reviewed in detail.   Since leaving the hospital Mr. Arvie is feeling better "from the knees up."  He continues to have pain located in his bilateral knees and ankles with increased edema noted in his ankles. He continues to take his Biktarvy, Bactrim and fluconazole as prescribed with no adverse side effects. He has not missed any doses of medications and has yet to pick up refills as he has medication available. No fevers, chills or sweats. He is ambulating with a walker and working with physical therapy. He elevates his legs as he is able and sits in a recliner getting up periodically. Ankles and knees feel better after he is moving around. He has not followed up with orthopedics to date because they are requesting a co-pay over $200 which he does not have. He has also noted his finger nails are starting to fall off. He has been taking Tylenol #3 and an old prescription for Tramadol for pain which does not seem to help with his symptoms. He has not been using any non-pharmacological therapy to help with his symptoms.      Allergies  Allergen Reactions  . Meperidine Anaphylaxis      Outpatient Medications Prior to Visit  Medication Sig Dispense Refill  .  acetaminophen-codeine (TYLENOL #3) 300-30 MG tablet Take 1 tablet by mouth every 4 (four) hours as needed for moderate pain. 30 tablet 0  . aspirin 81 MG tablet Take 81 mg by mouth daily.    . bictegravir-emtricitabine-tenofovir AF (BIKTARVY) 50-200-25 MG TABS tablet Take 1 tablet by mouth daily. 30 tablet 2  . fluconazole (DIFLUCAN) 100 MG tablet Take 1 tablet (100 mg total) by mouth once a week. 4 tablet 1  . ibuprofen (ADVIL,MOTRIN) 200 MG tablet Take 200 mg by  mouth 2 (two) times daily as needed for moderate pain.    Marland Kitchen loperamide (IMODIUM) 2 MG capsule Take 2 mg by mouth as needed for diarrhea or loose stools.    . Menthol-Methyl Salicylate (MUSCLE RUB) 10-15 % CREA Apply 1 application topically as needed for muscle pain.    Marland Kitchen sulfamethoxazole-trimethoprim (BACTRIM DS,SEPTRA DS) 800-160 MG tablet Take 1 tablet by mouth daily. 30 tablet 0  . penicillin g benzathine (BICILLIN LA) 1200000 UNIT/2ML SUSP injection Inject 4 mLs (2.4 Million Units total) into the muscle once a week. (Patient not taking: Reported on 09/07/2018) 2 mL 1   No facility-administered medications prior to visit.      Past Medical History:  Diagnosis Date  . Angina pectoris ([place])   . HIV (human immunodeficiency virus infection) ([place])       Past Surgical History:  Procedure Laterality Date  . Right knee surgery     [facility] - at [facility]      History reviewed. No pertinent family history.    Social History   Socioeconomic History  . Marital status: Married    Spouse name: Not on file  . Number of children: Not on file  . Years of education: Not on file  . Highest education level: Not on file  Occupational History  . Occupation: Diplomatic Services operational officer  . Financial resource strain: Not on file  . Food insecurity:    Worry: Not on file    Inability: Not on file  . Transportation needs:    Medical: Not on file    Non-medical: Not on file  Tobacco Use  . Smoking status: Current Every Day Smoker    Packs/day: 1.00    Years: 25.00    Pack years: 25.00    Types: Cigarettes  . Smokeless tobacco: Never Used  Substance and Sexual Activity  . Alcohol use: Not Currently  . Drug use: Never  . Sexual activity: Not Currently  Lifestyle  . Physical activity:    Days per week: Not on file    Minutes per session: Not on file  . Stress: Not on file  Relationships  . Social connections:    Talks on phone: Not on file    Gets together: Not on file    Attends religious  service: Not on file    Active member of club or organization: Not on file    Attends meetings of clubs or organizations: Not on file    Relationship status: Not on file  . Intimate partner violence:    Fear of current or ex partner: Not on file    Emotionally abused: Not on file    Physically abused: Not on file    Forced sexual activity: Not on file  Other Topics Concern  . Not on file  Social History Narrative  . Not on file     Review of Systems  Constitutional: Positive for unexpected weight change. Negative for appetite change, chills, fatigue and fever.  Eyes: Negative for visual disturbance.  Respiratory: Negative for cough, chest tightness, shortness of breath and wheezing.   Cardiovascular: Positive for leg swelling. Negative for chest pain.  Gastrointestinal: Negative for abdominal pain, constipation, diarrhea, nausea, rectal pain and vomiting.  Genitourinary: Negative for dysuria, flank pain, frequency, genital sores, hematuria and urgency.  Musculoskeletal: Positive for joint swelling.  Skin: Negative for rash.  Allergic/Immunologic: Negative for immunocompromised state.  Neurological: Negative for dizziness and headaches.       Objective:    BP 107/74   Pulse (!) 125   Temp 97.9 F (36.6 C)   Ht 6\' 1"  (1.854 m)   Wt 146 lb (66.2 kg)   BMI 19.26 kg/m  Nursing note and vital signs reviewed.  Physical Exam  Constitutional: He is oriented to person, place, and time. He appears well-developed and well-nourished. No distress.  HENT:  Mouth/Throat: Oropharynx is clear and moist. Abnormal dentition. Dental caries present.  Eyes: Conjunctivae are normal.  Neck: Neck supple.  Cardiovascular: Normal rate, regular rhythm, normal heart sounds and intact distal pulses. Exam reveals no gallop and no friction rub.  No murmur heard. Pulmonary/Chest: Effort normal and breath sounds normal. No respiratory distress. He has no wheezes. He has no rales. He exhibits no  tenderness.  Abdominal: Soft. Bowel sounds are normal. There is no tenderness.  Musculoskeletal:  Bilateral knees with surgical incision that appear well approximated. On the right there is a wound that appears to be healing with no significant evidence of drainage or odor. There is mild edema.   Bilateral ankles with 2+ pitting edema appears gravity dependent. Range of motion limited secondary to discomfort and edema.   Lymphadenopathy:    He has no cervical adenopathy.  Neurological: He is alert and oriented to person, place, and time.  Skin: Skin is warm and dry. No rash noted.  Psychiatric: He has a normal mood and affect. His behavior is normal. Judgment and thought content normal.        Assessment & Plan:   Problem List Items Addressed This Visit      Other   AIDS (acquired immune deficiency syndrome) ([place]) - Primary    Mr. Hissong appears to be doing well with his new antiretroviral therapy with Biktarvy supplemented with Bactrim for OI prophylaxis. He is adherent with no missed dosages and reports feeling significantly improved. He does not have any new signs/symptoms of opportunistic infection through history or physical exam. Will check CD4 count and viral load today. Continue current dose of Biktarvy supplemented with Bactrim for OI prophylaxis. Continues to await UMAP approval but has Advancing Access through September 2020. Will work to if he is eligible for assistance with THP. Follow up office visit in 1 month or sooner if needed.       Relevant Medications   sulfamethoxazole-trimethoprim (BACTRIM DS,SEPTRA DS) 800-160 MG tablet   Other Relevant Orders   HIV 1 RNA quant-no reflex-bld   T-helper cell (CD4)- ([facility] clinic only)   Knee mass, right    Right knee mass appears to be healing and recommend follow up with orthopedics. His bilateral knees are slightly warm today, but joints have been without septic arthritis and cultures have been negative. He did have one positive  culture for MSSA and was treated with Bactrim which should have covered it well. Recommend non-pharmacological therapy including ice and compression as able. Pain management per primary care.       Syphilis    Has received 3 weekly doses  of Bicillin with initial titer of 1:32. Recheck titer in about 3 months.       Relevant Medications   sulfamethoxazole-trimethoprim (BACTRIM DS,SEPTRA DS) 800-160 MG tablet   Bilateral lower extremity edema    Bilateral lower extremity edema and ankle pain appears to be gravity dependent. Improved with mobilization. Encouraged compression wraps and ankle pump type exercises to help reduce edema. Follow up with orthopedics as able.           I have discontinued Colon Rueth. Cardona's penicillin g benzathine. I am also having him maintain his aspirin, ibuprofen, MUSCLE RUB, loperamide, fluconazole, acetaminophen-codeine, bictegravir-emtricitabine-tenofovir AF, and sulfamethoxazole-trimethoprim.   Meds ordered this encounter  Medications  . sulfamethoxazole-trimethoprim (BACTRIM DS,SEPTRA DS) 800-160 MG tablet    Sig: Take 1 tablet by mouth daily.    Dispense:  30 tablet    Refill:  0    Order Specific Question:   Supervising Provider    Answer:   Carlyle Basques [[id number]]     Follow-up: Return in about 1 month (around 10/08/2018), or if symptoms worsen or fail to improve.    Terri Piedra, MSN, FNP-C Nurse Practitioner [facility] Office phone: [phone] Pager: [place] number: [phone]

## 2018-09-08 ENCOUNTER — Other Ambulatory Visit: Payer: Self-pay | Admitting: Family

## 2018-09-08 ENCOUNTER — Telehealth: Payer: Self-pay | Admitting: *Deleted

## 2018-09-08 ENCOUNTER — Encounter: Payer: Self-pay | Admitting: Family

## 2018-09-08 DIAGNOSIS — R600 Localized edema: Secondary | ICD-10-CM | POA: Insufficient documentation

## 2018-09-08 DIAGNOSIS — B20 Human immunodeficiency virus [HIV] disease: Secondary | ICD-10-CM

## 2018-09-08 MED ORDER — SULFAMETHOXAZOLE-TRIMETHOPRIM 800-160 MG PO TABS: 1.0000 | ORAL_TABLET | Freq: Every day | ORAL | 0 refills | Status: DC

## 2018-09-08 MED ORDER — BICTEGRAVIR-EMTRICITAB-TENOFOV 50-200-25 MG PO TABS: 1.0000 | ORAL_TABLET | Freq: Every day | ORAL | 2 refills | Status: DC

## 2018-09-08 NOTE — Assessment & Plan Note (Signed)
Right knee mass appears to be healing and recommend follow up with orthopedics. His bilateral knees are slightly warm today, but joints have been without septic arthritis and cultures have been negative. He did have one positive culture for MSSA and was treated with Bactrim which should have covered it well. Recommend non-pharmacological therapy including ice and compression as able. Pain management per primary care.

## 2018-09-08 NOTE — Telephone Encounter (Signed)
He should not need the fluconazole. I will send in the Bactrim and Biktarvy.

## 2018-09-08 NOTE — Assessment & Plan Note (Signed)
Bilateral lower extremity edema and ankle pain appears to be gravity dependent. Improved with mobilization. Encouraged compression wraps and ankle pump type exercises to help reduce edema. Follow up with orthopedics as able.

## 2018-09-08 NOTE — Telephone Encounter (Signed)
[facility] called, needs prescriptions sent. Patient requesting Bactrim and Fluconazole. Please advise on fluconazole, as it was discontinued at visit yesterday. Landis Gandy, RN

## 2018-09-08 NOTE — Assessment & Plan Note (Signed)
Has received 3 weekly doses of Bicillin with initial titer of 1:32. Recheck titer in about 3 months.

## 2018-09-08 NOTE — Assessment & Plan Note (Signed)
Patrick Huber appears to be doing well with his new antiretroviral therapy with Biktarvy supplemented with Bactrim for OI prophylaxis. He is adherent with no missed dosages and reports feeling significantly improved. He does not have any new signs/symptoms of opportunistic infection through history or physical exam. Will check CD4 count and viral load today. Continue current dose of Biktarvy supplemented with Bactrim for OI prophylaxis. Continues to await UMAP approval but has Advancing Access through September 2020. Will work to if he is eligible for assistance with THP. Follow up office visit in 1 month or sooner if needed.

## 2018-09-08 NOTE — Telephone Encounter (Signed)
Notified [facility]. Thank you!

## 2018-09-09 ENCOUNTER — Telehealth: Payer: Self-pay | Admitting: Behavioral Health

## 2018-09-09 LAB — T-HELPER CELL (CD4) - (RCID CLINIC ONLY)
CD4 % Helper T Cell: 3 % — ABNORMAL LOW (ref 33–55)
CD4 T CELL ABS: 30 /uL — AB (ref 400–2700)

## 2018-09-09 LAB — HIV-1 RNA QUANT-NO REFLEX-BLD
HIV 1 RNA Quant: 39 copies/mL — ABNORMAL HIGH
HIV-1 RNA Quant, Log: 1.59 Log copies/mL — ABNORMAL HIGH

## 2018-09-09 NOTE — Telephone Encounter (Signed)
-----   Message from [place], [place] sent at 09/09/2018 11:46 AM EDT ----- Please inform Mr. Keating that his viral load has significantly improved to 39 and his CD4 count remains low. Therefore continue to take Biktarvy and Bactrim and follow up in 1 month as scheduled.

## 2018-09-09 NOTE — Telephone Encounter (Signed)
Called Patrick Huber, verified identity.  Informed him per Terri Piedra NP that his viral load has improved significantly to 39 however his CD4 count remains low.  Informed him to continue to take his Biktarvy and Bactrim and follow up in 1 month as scheduled.  Patient was appreciative and verbalized understanding. Pricilla Riffle RN

## 2018-09-28 ENCOUNTER — Ambulatory Visit: Payer: Self-pay | Admitting: Family Medicine

## 2018-10-01 ENCOUNTER — Other Ambulatory Visit: Payer: Self-pay | Admitting: Family

## 2018-10-05 LAB — ACID FAST CULTURE WITH REFLEXED SENSITIVITIES (MYCOBACTERIA): Acid Fast Culture: NEGATIVE

## 2018-10-07 ENCOUNTER — Ambulatory Visit (INDEPENDENT_AMBULATORY_CARE_PROVIDER_SITE_OTHER): Payer: Self-pay | Admitting: Internal Medicine

## 2018-10-07 ENCOUNTER — Encounter: Payer: Self-pay | Admitting: Internal Medicine

## 2018-10-07 DIAGNOSIS — A528 Late syphilis, latent: Secondary | ICD-10-CM

## 2018-10-07 DIAGNOSIS — L409 Psoriasis, unspecified: Secondary | ICD-10-CM

## 2018-10-07 DIAGNOSIS — R634 Abnormal weight loss: Secondary | ICD-10-CM

## 2018-10-07 DIAGNOSIS — B20 Human immunodeficiency virus [HIV] disease: Secondary | ICD-10-CM

## 2018-10-07 DIAGNOSIS — G629 Polyneuropathy, unspecified: Secondary | ICD-10-CM

## 2018-10-07 MED ORDER — PREGABALIN 75 MG PO CAPS: 75.0000 mg | ORAL_CAPSULE | Freq: Two times a day (BID) | ORAL | 11 refills | Status: DC

## 2018-10-07 MED ORDER — PREGABALIN 75 MG PO CAPS: 75.0000 mg | ORAL_CAPSULE | Freq: Two times a day (BID) | ORAL | 5 refills | Status: DC

## 2018-10-07 NOTE — Assessment & Plan Note (Signed)
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  I will start him on pregabalin and see him back in 1 month.

## 2018-10-07 NOTE — Assessment & Plan Note (Signed)
He is beginning to gain back some of the weight that he lost.  His diarrhea has resolved.  He is eating well.  I suspect that his bilateral pedal edema is due to malnutrition and will be improve as his nutrition improved.

## 2018-10-07 NOTE — Assessment & Plan Note (Signed)
His infection was far advanced at the time of diagnosis but is already coming under better control with Biktarvy.  He is feeling better overall.  I told him it will take 6 to 12 months before he is probably back at his new baseline.  He will continue Biktarvy and follow-up for blood work in 4 weeks.

## 2018-10-07 NOTE — Progress Notes (Signed)
Patient Active Problem List   Diagnosis Date Noted  . HIV disease ([place]) 08/18/2018    Priority: High  . Neuropathy 10/07/2018  . Bilateral lower extremity edema 09/08/2018  . 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 Malnutrition of moderate degree 08/18/2018  . Unintentional weight loss 08/18/2018  . Polyarthralgia 08/18/2018  . Knee mass, right 08/18/2018  . Pancytopenia ([place]) 08/18/2018  . Cigarette smoker 08/18/2018  . GERD (gastroesophageal reflux disease) 08/18/2018  . Psoriasis 08/18/2018  . History of MRSA infection 08/18/2018    Patient's Medications  New Prescriptions   PREGABALIN (LYRICA) 75 MG CAPSULE    Take 1 capsule (75 mg total) by mouth 2 (two) times daily.  Previous Medications   ACETAMINOPHEN-CODEINE (TYLENOL #3) 300-30 MG TABLET    Take 1 tablet by mouth every 4 (four) hours as needed for moderate pain.   ASPIRIN 81 MG TABLET    Take 81 mg by mouth daily.   BICTEGRAVIR-EMTRICITABINE-TENOFOVIR AF (BIKTARVY) 50-200-25 MG TABS TABLET    Take 1 tablet by mouth daily.   FLUCONAZOLE (DIFLUCAN) 100 MG TABLET    Take 1 tablet (100 mg total) by mouth once a week.   IBUPROFEN (ADVIL,MOTRIN) 200 MG TABLET    Take 200 mg by mouth 2 (two) times daily as needed for moderate pain.   LOPERAMIDE (IMODIUM) 2 MG CAPSULE    Take 2 mg by mouth as needed for diarrhea or loose stools.   MENTHOL-METHYL SALICYLATE (MUSCLE RUB) 10-15 % CREA    Apply 1 application topically as needed for muscle pain.   SULFAMETHOXAZOLE-TRIMETHOPRIM (BACTRIM DS,SEPTRA DS) 800-160 MG TABLET    TAKE 1 TABLET BY MOUTH DAILY  Modified Medications   No medications on file  Discontinued Medications   No medications on file    Subjective: Patrick Huber is in for his routine HIV follow-up visit.  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 He was seen in the emergency department in [facility] on 02/16/2018 and underwent  arthrocentesis of his right knee. This showed 4703 white blood cells with 43% segmented neutrophils and 57% monocytes. There were no crystals seen. There were no organisms on Gram stain and cultures were negative. He was referred to [facility] orthopedics where he was seen in early April. Initially there was concern for prepatellar bursitis. 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 He underwent biopsy of the mass on 05/01/2018. Pathology showed synovitis with edema and acute and chronic inflammation. Gram stain showed gram-positive cocci but cultures were negative.  He underwent repeat, open biopsy on 06/19/2018 which showed benign synovium with reactive and degenerative changes. Cultures grew MRSA. He was started on oral trimethoprim sulfamethoxazole which he has been on ever since. He continued to have right knee pain and developed severe watery diarrhea. He has lost 60 pounds since that time. He was seen back by his orthopedist on 07/16/2018. He was instructed to go to the ED for admission and arthrocentesis of both his left and right knee. He was worried about co-pays. He then went to the [facility] ED on 07/27/2018. He underwent arthrocentesis of his right knee which showed 2977 white blood cells of which 8% were segmented neutrophils and 79% were other cells. His culture was negative. They noted minor dehiscence of his  right knee incision.  He continued to do poorly. He has had trouble getting out of bed and had continued diarrhea and weight loss. In addition to pain in both knees he has now developed some pain in his neck and in both elbows leading to admission here yesterday. He developed a low grade temperature of 100.6 degrees. He has pancytopenia. He was started on broad empiric antibiotics after blood cultures were obtained. He tested positive for HIV and late latent syphilis.  His CD4 count  was low at 20 and his initial viral load was 66,800. He denies known risk factors for HIV infection. He says that he has had only 2 lifetime sexual contacts, his wife who he is separated from, and his current girlfriend. He denies any illicit drug use and specifically injecting drug use. He has had one professionally done tattoo on his right arm. He has never had a blood transfusion. He denies any history of other sexually transmitted diseases. He initially told me that he has been tested for HIV in the past and said that he was not sure.    He has completed therapy for late latent syphilis.  He was started on Biktarvy while hospitalized.  He is having no problems tolerating it and denies missing any doses.  He is also taking his trimethoprim sulfamethoxazole.  Overall he is feeling much better.  His appetite is improved, his diarrhea has been more resolved and he is gaining weight.  He is still bothered by swelling in his feet that gets worse throughout the day.  However, his principal problem is pain in his feet and toes that makes it difficult to walk.  He is back living with himself in his rented apartment.  He is worried that he will be kicked out of his apartment if he does not get disability soon.  He does have good support from friends. Review of Systems: Review of Systems  Constitutional: Negative for chills, diaphoresis, fever, malaise/fatigue and weight loss.  HENT: Negative for nosebleeds and sore throat.   Respiratory: Negative for cough, sputum production and shortness of breath.   Cardiovascular: Negative for chest pain.  Gastrointestinal: Negative for abdominal pain, diarrhea, nausea and vomiting.  Genitourinary: Negative for dysuria.  Musculoskeletal: Positive for joint pain. Negative for back pain and myalgias.  Skin:       Lesions on his scalp are markedly improved.  He has lost most of his toenails and fingernails.  Neurological: Positive for sensory change. Negative for  headaches.  Psychiatric/Behavioral: Negative for depression and substance abuse. The patient is not nervous/anxious.     Past Medical History:  Diagnosis Date  . Angina pectoris ([place])   . HIV (human immunodeficiency virus infection) ([place])     Social History   Tobacco Use  . Smoking status: Current Every Day Smoker    Packs/day: 1.00    Years: 25.00    Pack years: 25.00    Types: Cigarettes  . Smokeless tobacco: Never Used  Substance Use Topics  . Alcohol use: Not Currently  . Drug use: Never    No family history on file.  Allergies  Allergen Reactions  . Meperidine Anaphylaxis    Health Maintenance  Topic Date Due  . TETANUS/TDAP  10/28/1990  . INFLUENZA VACCINE  07/02/2018  . HIV Screening  Completed    Objective:  Vitals:   10/07/18 1523  BP: 129/85  Pulse: 97  Temp: 97.7 F (36.5 C)  TempSrc: Oral  Weight: 148 lb (67.1 kg)  Height: 6\' 1"  (1.854 m)   Body mass index is 19.53 kg/m.  Physical Exam  Constitutional: He is oriented to person, place, and time.  He is looking much better than when I last saw him in the hospital.  He is in good spirits.  He is gained 2 pounds since his last visit here.  HENT:  Mouth/Throat: No oropharyngeal exudate.  Eyes: Conjunctivae are normal.  Cardiovascular: Normal rate, regular rhythm and normal heart sounds.  Pulmonary/Chest: Effort normal and breath sounds normal.  Abdominal: Soft. He exhibits no distension. There is no tenderness.  Musculoskeletal:  He still has some mild swelling of his elbows, right greater than left but no erythema or tenderness to suggest infection.  He has no swelling of his knees.  He does have bilateral pedal edema.  His feet are warm and well perfused.  Neurological: He is alert and oriented to person, place, and time.  Skin: No rash noted.  He is missing most of his toenails and fingernails.  He has a rough dry cracked skin on his feet.  His scaly scalp lesions are much improved.     Lab Results Lab Results  Component Value Date   WBC 2.0 (L) 08/22/2018   HGB 7.4 (L) 08/22/2018   HCT 25.1 (L) 08/22/2018   MCV 82.8 08/22/2018   PLT 139 (L) 08/22/2018    Lab Results  Component Value Date   CREATININE 0.56 (L) 08/22/2018   BUN 15 08/18/2018   NA 137 08/18/2018   K 3.7 08/19/2018   CL 100 08/18/2018   CO2 27 08/18/2018    Lab Results  Component Value Date   ALT 58 (H) 08/17/2018   AST 62 (H) 08/17/2018   ALKPHOS 168 (H) 08/17/2018   BILITOT 1.0 08/17/2018    No results found for: CHOL, HDL, LDLCALC, LDLDIRECT, TRIG, CHOLHDL Lab Results  Component Value Date   LABRPR Reactive (A) 08/18/2018   HIV 1 RNA Quant (copies/mL)  Date Value  09/07/2018 39 (H)  08/18/2018 66,800   CD4 T Cell Abs (/uL)  Date Value  09/07/2018 30 (L)  08/18/2018 20 (L)     Problem List Items Addressed This Visit      High   HIV disease ([place])    His infection was far advanced at the time of diagnosis but is already coming under better control with Biktarvy.  He is feeling better overall.  I told him it will take 6 to 12 months before he is probably back at his new baseline.  He will continue Biktarvy and follow-up for blood work in 4 weeks.        Unprioritized   Unintentional weight loss    He is beginning to gain back some of the weight that he lost.  His diarrhea has resolved.  He is eating well.  I suspect that his bilateral pedal edema is due to malnutrition and will be improve as his nutrition improved.      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  He is already showing some improvement as his infection comes under better control.      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  I will start him on pregabalin and see him back in 1 month.      Relevant Medications   pregabalin (LYRICA) 75 MG capsule   Late latent syphilis    He  has completed therapy for late latent  syphilis.  I will repeat his RPR in 1 month.           Patrick Bickers, MD [facility] [phone] pager   [phone] cell 10/07/2018, 4:07 PM

## 2018-10-07 NOTE — Assessment & Plan Note (Signed)
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  He is already showing some improvement as his infection comes under better control.

## 2018-10-07 NOTE — Addendum Note (Signed)
Addended by: Landis Gandy on: 10/07/2018 05:28 PM   Modules accepted: Orders

## 2018-10-07 NOTE — Assessment & Plan Note (Signed)
He has completed therapy for late latent syphilis.  I will repeat his RPR in 1 month.

## 2018-10-08 ENCOUNTER — Ambulatory Visit: Payer: Self-pay | Admitting: Family

## 2018-10-12 ENCOUNTER — Other Ambulatory Visit: Payer: Self-pay | Admitting: Family

## 2018-11-09 ENCOUNTER — Telehealth: Payer: Self-pay

## 2018-11-09 ENCOUNTER — Encounter: Payer: Self-pay | Admitting: Internal Medicine

## 2018-11-09 ENCOUNTER — Ambulatory Visit (INDEPENDENT_AMBULATORY_CARE_PROVIDER_SITE_OTHER): Payer: Self-pay | Admitting: Internal Medicine

## 2018-11-09 DIAGNOSIS — A528 Late syphilis, latent: Secondary | ICD-10-CM

## 2018-11-09 DIAGNOSIS — R600 Localized edema: Secondary | ICD-10-CM

## 2018-11-09 DIAGNOSIS — R634 Abnormal weight loss: Secondary | ICD-10-CM

## 2018-11-09 DIAGNOSIS — G629 Polyneuropathy, unspecified: Secondary | ICD-10-CM

## 2018-11-09 DIAGNOSIS — B20 Human immunodeficiency virus [HIV] disease: Secondary | ICD-10-CM

## 2018-11-09 DIAGNOSIS — L409 Psoriasis, unspecified: Secondary | ICD-10-CM

## 2018-11-09 DIAGNOSIS — R748 Abnormal levels of other serum enzymes: Secondary | ICD-10-CM | POA: Insufficient documentation

## 2018-11-09 DIAGNOSIS — D649 Anemia, unspecified: Secondary | ICD-10-CM | POA: Insufficient documentation

## 2018-11-09 MED ORDER — PREGABALIN 100 MG PO CAPS: 100.0000 mg | ORAL_CAPSULE | Freq: Three times a day (TID) | ORAL | 5 refills | Status: DC

## 2018-11-09 NOTE — Assessment & Plan Note (Signed)
His dependent edema is due to HIV related malnutrition.  I expect the edema to resolve once his nutritional status has more time to improve.

## 2018-11-09 NOTE — Telephone Encounter (Signed)
Per Dr. Megan Salon called pharmacy with new Lyrica prescription. Spoke with [facility] pharmacist who was able to take prescription over phone. Confirmed dosage, Quantity, refills before ending call. Patient is aware of Lyrica dosage increase.  Lyrica 100 mg capsule Take one cap three times a day. [place]

## 2018-11-09 NOTE — Assessment & Plan Note (Signed)
He underwent treatment for late latent syphilis 2-1/2 months ago.  I will repeat an RPR at his next visit.

## 2018-11-09 NOTE — Assessment & Plan Note (Signed)
He has HIV related painful peripheral neuropathy.  I will increase his pregabalin.

## 2018-11-09 NOTE — Assessment & Plan Note (Signed)
His weight loss is reversing coincident with treatment of his HIV infection.

## 2018-11-09 NOTE — Progress Notes (Signed)
Patient Active Problem List   Diagnosis Date Noted  . HIV disease ([place]) 08/18/2018    Priority: High  . Anemia 11/09/2018  . Elevated liver enzymes 11/09/2018  . Neuropathy 10/07/2018  . Bilateral lower extremity edema 09/08/2018  . 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 Malnutrition of moderate degree 08/18/2018  . Unintentional weight loss 08/18/2018  . Polyarthralgia 08/18/2018  . Knee mass, right 08/18/2018  . Pancytopenia ([place]) 08/18/2018  . Cigarette smoker 08/18/2018  . GERD (gastroesophageal reflux disease) 08/18/2018  . Psoriasis 08/18/2018  . History of MRSA infection 08/18/2018    Patient's Medications  New Prescriptions   No medications on file  Previous Medications   BICTEGRAVIR-EMTRICITABINE-TENOFOVIR AF (BIKTARVY) 50-200-25 MG TABS TABLET    Take 1 tablet by mouth daily.   SULFAMETHOXAZOLE-TRIMETHOPRIM (BACTRIM DS,SEPTRA DS) 800-160 MG TABLET    TAKE 1 TABLET BY MOUTH DAILY  Modified Medications   Modified Medication Previous Medication   PREGABALIN (LYRICA) 100 MG CAPSULE pregabalin (LYRICA) 75 MG capsule      Take 1 capsule (100 mg total) by mouth 3 (three) times daily.    Take 1 capsule (75 mg total) by mouth 2 (two) times daily.  Discontinued Medications   ACETAMINOPHEN-CODEINE (TYLENOL #3) 300-30 MG TABLET    Take 1 tablet by mouth every 4 (four) hours as needed for moderate pain.   ASPIRIN 81 MG TABLET    Take 81 mg by mouth daily.   FLUCONAZOLE (DIFLUCAN) 100 MG TABLET    Take 1 tablet (100 mg total) by mouth once a week.   IBUPROFEN (ADVIL,MOTRIN) 200 MG TABLET    Take 200 mg by mouth 2 (two) times daily as needed for moderate pain.   LOPERAMIDE (IMODIUM) 2 MG CAPSULE    Take 2 mg by mouth as needed for diarrhea or loose stools.   MENTHOL-METHYL SALICYLATE (MUSCLE RUB) 10-15 % CREA    Apply 1 application topically as needed for muscle pain.    Subjective: Erven is in for his routine HIV follow-up visit.  He has had no problems obtaining,  taking or tolerating his Biktarvy.  He never misses a single dose.  He also continues to take his prophylactic trimethoprim sulfamethoxazole and pregabalin.  His bilateral foot pain is only slightly better on his initial dose of pregabalin.  He can occasionally walk around his house without his crutches but still uses them when he is away from home.  He has not been able to return to work.  He still has some swelling of his ankles that gets worse throughout the day but this is improving slowly.  His appetite is much better and he is gaining weight.  The rash on his scalp and arms is nearly gone.  He has been able to stay in his current apartment.  He is not having to pay rent and he has help from friends and pain his other bills.  Review of Systems: Review of Systems  Constitutional: Positive for malaise/fatigue. Negative for chills, diaphoresis, fever and weight loss.  HENT: Negative for sore throat.   Respiratory: Negative for cough, sputum production and shortness of breath.   Cardiovascular: Negative for chest pain.  Gastrointestinal: Negative for abdominal pain, diarrhea, heartburn, nausea and vomiting.  Genitourinary: Negative for dysuria and frequency.  Musculoskeletal: Positive for joint pain. Negative for myalgias.  Skin: Negative for rash.  Neurological: Positive for sensory change. Negative for dizziness and headaches.  Psychiatric/Behavioral: Negative for depression and  substance abuse. The patient is not nervous/anxious.     Past Medical History:  Diagnosis Date  . Angina pectoris ([place])   . HIV (human immunodeficiency virus infection) ([place])     Social History   Tobacco Use  . Smoking status: Current Every Day Smoker    Packs/day: 1.00    Years: 25.00    Pack years: 25.00    Types: Cigarettes  . Smokeless tobacco: Never Used  Substance Use Topics  . Alcohol use: Not Currently  . Drug use: Never    No family history on file.  Allergies  Allergen Reactions  .  Meperidine Anaphylaxis    Health Maintenance  Topic Date Due  . TETANUS/TDAP  10/28/1990  . INFLUENZA VACCINE  07/02/2018  . HIV Screening  Completed    Objective:  Vitals:   11/09/18 1430  BP: 123/83  Pulse: (!) 105  Temp: 97.7 F (36.5 C)  TempSrc: Oral  Weight: 162 lb (73.5 kg)   Body mass index is 21.37 kg/m.  Physical Exam  Constitutional: He is oriented to person, place, and time.  He is in good spirits.  He is gained 16 pounds in the past 2 months.  HENT:  Mouth/Throat: No oropharyngeal exudate.  He is edentulous and is not wearing his dentures.  Eyes: Conjunctivae are normal.  Cardiovascular: Normal rate, regular rhythm and normal heart sounds.  No murmur heard. Pulmonary/Chest: Effort normal and breath sounds normal.  Abdominal: Soft. He exhibits no mass. There is no tenderness.  Musculoskeletal: Normal range of motion. He exhibits no edema.  He does not have any peripheral edema in his legs currently.  Neurological: He is alert and oriented to person, place, and time.  Skin: No rash noted.  His scalp and arm rashes have resolved.  He still has dystrophic nails.  Psychiatric: He has a normal mood and affect.    Lab Results Lab Results  Component Value Date   WBC 2.0 (L) 08/22/2018   HGB 7.4 (L) 08/22/2018   HCT 25.1 (L) 08/22/2018   MCV 82.8 08/22/2018   PLT 139 (L) 08/22/2018    Lab Results  Component Value Date   CREATININE 0.56 (L) 08/22/2018   BUN 15 08/18/2018   NA 137 08/18/2018   K 3.7 08/19/2018   CL 100 08/18/2018   CO2 27 08/18/2018    Lab Results  Component Value Date   ALT 58 (H) 08/17/2018   AST 62 (H) 08/17/2018   ALKPHOS 168 (H) 08/17/2018   BILITOT 1.0 08/17/2018    No results found for: CHOL, HDL, LDLCALC, LDLDIRECT, TRIG, CHOLHDL Lab Results  Component Value Date   LABRPR Reactive (A) 08/18/2018   HIV 1 RNA Quant (copies/mL)  Date Value  09/07/2018 39 (H)  08/18/2018 66,800   CD4 T Cell Abs (/uL)  Date Value    09/07/2018 30 (L)  08/18/2018 20 (L)     Problem List Items Addressed This Visit      High   HIV disease ([place])    His infection is coming under control quickly after starting Biktarvy to half months ago.  I expect his CD4 reconstitution to be very slow.  He will continue Biktarvy and get repeat lab work today.  He received his influenza vaccine today.  He will follow-up in 2 months.      Relevant Orders   T-helper cell (CD4)- ([facility] clinic only)   HIV-1 RNA quant-no reflex-bld     Unprioritized   Unintentional weight loss  His weight loss is reversing coincident with treatment of his HIV infection.      Psoriasis    His psoriasis is improving coincident with treatment of his HIV infection.      Neuropathy    He has HIV related painful peripheral neuropathy.  I will increase his pregabalin.      Relevant Medications   pregabalin (LYRICA) 100 MG capsule   Late latent syphilis    He underwent treatment for late latent syphilis 2-1/2 months ago.  I will repeat an RPR at his next visit.      Elevated liver enzymes   Bilateral lower extremity edema    His dependent edema is due to HIV related malnutrition.  I expect the edema to resolve once his nutritional status has more time to improve.      Anemia        Michel Bickers, MD [facility] [phone] pager   [phone] cell 11/09/2018, 3:03 PM

## 2018-11-09 NOTE — Assessment & Plan Note (Signed)
His psoriasis is improving coincident with treatment of his HIV infection.

## 2018-11-09 NOTE — Assessment & Plan Note (Signed)
His infection is coming under control quickly after starting Biktarvy to half months ago.  I expect his CD4 reconstitution to be very slow.  He will continue Biktarvy and get repeat lab work today.  He received his influenza vaccine today.  He will follow-up in 2 months.

## 2018-11-10 LAB — T-HELPER CELL (CD4) - (RCID CLINIC ONLY)
CD4 % Helper T Cell: 6 % — ABNORMAL LOW (ref 33–55)
CD4 T CELL ABS: 80 /uL — AB (ref 400–2700)

## 2018-11-11 LAB — HIV-1 RNA QUANT-NO REFLEX-BLD
HIV 1 RNA Quant: <20 copies/mL
HIV-1 RNA QUANT, LOG: NOT DETECTED {Log_copies}/mL

## 2018-12-08 ENCOUNTER — Telehealth: Payer: Self-pay | Admitting: *Deleted

## 2018-12-08 NOTE — Telephone Encounter (Signed)
Angelica from [employer] called at patient's request for verbal confirmation that he is still unable to work. RN asked her to please fax the request on office letter head, gave her triage fax number. RN reached out to patient to confirm request. He would like the last two office notes from Dr Megan Salon to be faxed to support his support. Patient unsure how to renew RW/ADAP. RN made an appointment for financial counseling/labs on 1/27, transferred to [facility]. Landis Gandy, RN

## 2018-12-08 NOTE — Telephone Encounter (Signed)
[facility] also sent medical examination request where a physician addresses the patient's functional limitations for work/attending training.  Appropriate for you to complete?

## 2018-12-08 NOTE — Telephone Encounter (Signed)
I do not feel that he is able to return to work yet.  Thanks.

## 2018-12-09 NOTE — Telephone Encounter (Signed)
Yes, when I am back in clinic in the next week.  Thanks.

## 2018-12-11 ENCOUNTER — Other Ambulatory Visit: Payer: Self-pay | Admitting: Family

## 2018-12-16 ENCOUNTER — Other Ambulatory Visit: Payer: Self-pay

## 2018-12-16 DIAGNOSIS — B20 Human immunodeficiency virus [HIV] disease: Secondary | ICD-10-CM

## 2018-12-16 MED ORDER — BICTEGRAVIR-EMTRICITAB-TENOFOV 50-200-25 MG PO TABS: 1.0000 | ORAL_TABLET | Freq: Every day | ORAL | 0 refills | Status: DC

## 2018-12-18 NOTE — Telephone Encounter (Signed)
RN left message for Patrick Huber letting her know the form was filled out and faxed with corresponding office notes. Patient stated his food stamps were already cut off.  RN asked Patrick Huber to reconsider this.

## 2018-12-28 ENCOUNTER — Other Ambulatory Visit (HOSPITAL_COMMUNITY)
Admission: RE | Admit: 2018-12-28 | Discharge: 2018-12-28 | Disposition: A | Discharge status: Home / Self Care | Payer: Medicaid Other | Source: Ambulatory Visit | Attending: Internal Medicine | Admitting: Internal Medicine

## 2018-12-28 ENCOUNTER — Ambulatory Visit: Payer: Medicaid Other

## 2018-12-28 ENCOUNTER — Other Ambulatory Visit: Payer: Medicaid Other

## 2018-12-28 ENCOUNTER — Other Ambulatory Visit: Payer: Self-pay

## 2018-12-28 DIAGNOSIS — Z113 Encounter for screening for infections with a predominantly sexual mode of transmission: Secondary | ICD-10-CM

## 2018-12-28 DIAGNOSIS — B20 Human immunodeficiency virus [HIV] disease: Secondary | ICD-10-CM | POA: Diagnosis not present

## 2018-12-28 DIAGNOSIS — Z79899 Other long term (current) drug therapy: Secondary | ICD-10-CM

## 2018-12-28 DIAGNOSIS — Z114 Encounter for screening for human immunodeficiency virus [HIV]: Secondary | ICD-10-CM

## 2018-12-29 LAB — URINE CYTOLOGY ANCILLARY ONLY
Chlamydia: NEGATIVE
Neisseria Gonorrhea: NEGATIVE

## 2018-12-29 LAB — URINALYSIS
Bilirubin Urine: NEGATIVE
Glucose, UA: NEGATIVE
Hgb urine dipstick: NEGATIVE
Ketones, ur: NEGATIVE
Leukocytes, UA: NEGATIVE
NITRITE: NEGATIVE
Protein, ur: NEGATIVE
Specific Gravity, Urine: 1.021 (ref 1.001–1.03)
pH: 6 (ref 5.0–8.0)

## 2018-12-29 LAB — T-HELPER CELL (CD4) - (RCID CLINIC ONLY)
CD4 % Helper T Cell: 7 % — ABNORMAL LOW (ref 33–55)
CD4 T Cell Abs: 80 /uL — ABNORMAL LOW (ref 400–2700)

## 2019-01-02 LAB — COMPLETE METABOLIC PANEL WITH GFR
AG Ratio: 1 (calc) (ref 1.0–2.5)
ALT: 9 U/L (ref 9–46)
AST: 10 U/L (ref 10–40)
Albumin: 4.1 g/dL (ref 3.6–5.1)
Alkaline phosphatase (APISO): 118 U/L — ABNORMAL HIGH (ref 40–115)
BUN: 14 mg/dL (ref 7–25)
CO2: 28 mmol/L (ref 20–32)
Calcium: 9.2 mg/dL (ref 8.6–10.3)
Chloride: 104 mmol/L (ref 98–110)
Creat: 0.71 mg/dL (ref 0.60–1.35)
GFR, Est African American: 129 mL/min/{1.73_m2} (ref >=60)
GFR, Est Non African American: 112 mL/min/{1.73_m2} (ref >=60)
Globulin: 4.1 g/dL (calc) — ABNORMAL HIGH (ref 1.9–3.7)
Glucose, Bld: 85 mg/dL (ref 65–99)
Potassium: 4.4 mmol/L (ref 3.5–5.3)
Sodium: 137 mmol/L (ref 135–146)
Total Bilirubin: 0.3 mg/dL (ref 0.2–1.2)
Total Protein: 8.2 g/dL — ABNORMAL HIGH (ref 6.1–8.1)

## 2019-01-02 LAB — CBC WITH DIFFERENTIAL/PLATELET
Absolute Monocytes: 192 cells/uL — ABNORMAL LOW (ref 200–950)
Basophils Absolute: 20 cells/uL (ref 0–200)
Basophils Relative: 0.5 %
Eosinophils Absolute: 112 cells/uL (ref 15–500)
Eosinophils Relative: 2.8 %
HCT: 37.5 % — ABNORMAL LOW (ref 38.5–50.0)
Hemoglobin: 12.6 g/dL — ABNORMAL LOW (ref 13.2–17.1)
Lymphs Abs: 1136 cells/uL (ref 850–3900)
MCH: 29.6 pg (ref 27.0–33.0)
MCHC: 33.6 g/dL (ref 32.0–36.0)
MCV: 88.2 fL (ref 80.0–100.0)
MONOS PCT: 4.8 %
MPV: 9.5 fL (ref 7.5–12.5)
Neutro Abs: 2540 cells/uL (ref 1500–7800)
Neutrophils Relative %: 63.5 %
Platelets: 253 10*3/uL (ref 140–400)
RBC: 4.25 10*6/uL (ref 4.20–5.80)
RDW: 16.8 % — ABNORMAL HIGH (ref 11.0–15.0)
Total Lymphocyte: 28.4 %
WBC: 4 10*3/uL (ref 3.8–10.8)

## 2019-01-02 LAB — HEPATITIS B CORE ANTIBODY, TOTAL: Hep B Core Total Ab: NONREACTIVE

## 2019-01-02 LAB — HEPATITIS B SURFACE ANTIGEN: Hepatitis B Surface Ag: NONREACTIVE

## 2019-01-02 LAB — HIV-1 RNA ULTRAQUANT REFLEX TO GENTYP+
HIV 1 RNA Quant: <20 copies/mL
HIV-1 RNA Quant, Log: <1.3 Log copies/mL

## 2019-01-02 LAB — HIV-1/2 AB - DIFFERENTIATION
HIV-1 antibody: POSITIVE — AB
HIV-2 Ab: NEGATIVE

## 2019-01-02 LAB — RPR TITER: RPR Titer: 1:32 {titer} — ABNORMAL HIGH

## 2019-01-02 LAB — LIPID PANEL
CHOL/HDL RATIO: 5.8 (calc) — AB (ref <5.0)
Cholesterol: 150 mg/dL (ref <200)
HDL: 26 mg/dL — ABNORMAL LOW (ref >40)
LDL Cholesterol (Calc): 87 mg/dL (calc)
Non-HDL Cholesterol (Calc): 124 mg/dL (calc) (ref <130)
Triglycerides: 284 mg/dL — ABNORMAL HIGH (ref <150)

## 2019-01-02 LAB — RPR: RPR Ser Ql: REACTIVE — AB

## 2019-01-02 LAB — QUANTIFERON-TB GOLD PLUS
Mitogen-NIL: >10 IU/mL
NIL: 0.03 IU/mL
QuantiFERON-TB Gold Plus: NEGATIVE
TB1-NIL: <0 IU/mL

## 2019-01-02 LAB — HEPATITIS C ANTIBODY
HEP C AB: NONREACTIVE
SIGNAL TO CUT-OFF: 0.11 (ref <1.00)

## 2019-01-02 LAB — HLA B*5701: HLA-B*5701 w/rflx HLA-B High: NEGATIVE

## 2019-01-02 LAB — FLUORESCENT TREPONEMAL AB(FTA)-IGG-BLD: Fluorescent Treponemal ABS: NONREACTIVE

## 2019-01-02 LAB — HEPATITIS A ANTIBODY, TOTAL: Hepatitis A AB,Total: NONREACTIVE

## 2019-01-02 LAB — HIV ANTIBODY (ROUTINE TESTING W REFLEX): HIV 1&2 Ab, 4th Generation: REACTIVE — AB

## 2019-01-02 LAB — HEPATITIS B SURFACE ANTIBODY,QUALITATIVE: Hep B S Ab: NONREACTIVE

## 2019-01-06 ENCOUNTER — Other Ambulatory Visit: Payer: Self-pay | Admitting: Internal Medicine

## 2019-01-07 ENCOUNTER — Encounter: Payer: Self-pay | Admitting: Internal Medicine

## 2019-01-11 ENCOUNTER — Encounter: Payer: Self-pay | Admitting: Internal Medicine

## 2019-01-11 ENCOUNTER — Ambulatory Visit (INDEPENDENT_AMBULATORY_CARE_PROVIDER_SITE_OTHER): Payer: Medicaid Other | Admitting: Internal Medicine

## 2019-01-11 DIAGNOSIS — B20 Human immunodeficiency virus [HIV] disease: Secondary | ICD-10-CM

## 2019-01-11 DIAGNOSIS — A528 Late syphilis, latent: Secondary | ICD-10-CM | POA: Diagnosis not present

## 2019-01-11 DIAGNOSIS — L409 Psoriasis, unspecified: Secondary | ICD-10-CM

## 2019-01-11 DIAGNOSIS — M2550 Pain in unspecified joint: Secondary | ICD-10-CM

## 2019-01-11 DIAGNOSIS — R634 Abnormal weight loss: Secondary | ICD-10-CM | POA: Diagnosis not present

## 2019-01-11 NOTE — Assessment & Plan Note (Signed)
His infection has come under very good control since starting Biktarvy 6 months ago.  He is having slow CD4 reconstitution.  I will have him continue Biktarvy and trimethoprim sulfamethoxazole and follow-up after lab work in 3 months.

## 2019-01-11 NOTE — Assessment & Plan Note (Signed)
His psoriasis has resolved as his HIV infection has come under good control.

## 2019-01-11 NOTE — Assessment & Plan Note (Signed)
His RPR remains unchanged at 1: 32.  Interestingly, his FTA has converted back to nonreactive.  I will not retreat him at this time.  I will repeat his RPR in 3 months.

## 2019-01-11 NOTE — Assessment & Plan Note (Signed)
His joint swelling and pain is improving slowly.  I expect that it will continue to improve as his infection responds to treatment.

## 2019-01-11 NOTE — Progress Notes (Signed)
Patient Active Problem List   Diagnosis Date Noted  . HIV disease ([place]) 08/18/2018    Priority: High  . Anemia 11/09/2018  . Elevated liver enzymes 11/09/2018  . Neuropathy 10/07/2018  . Bilateral lower extremity edema 09/08/2018  . 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 Malnutrition of moderate degree 08/18/2018  . Unintentional weight loss 08/18/2018  . Polyarthralgia 08/18/2018  . Knee mass, right 08/18/2018  . Pancytopenia ([place]) 08/18/2018  . Cigarette smoker 08/18/2018  . GERD (gastroesophageal reflux disease) 08/18/2018  . Psoriasis 08/18/2018  . History of MRSA infection 08/18/2018    Patient's Medications  New Prescriptions   No medications on file  Previous Medications   BICTEGRAVIR-EMTRICITABINE-TENOFOVIR AF (BIKTARVY) 50-200-25 MG TABS TABLET    Take 1 tablet by mouth daily.   PREGABALIN (LYRICA) 100 MG CAPSULE    Take 1 capsule (100 mg total) by mouth 3 (three) times daily.   SULFAMETHOXAZOLE-TRIMETHOPRIM (BACTRIM DS,SEPTRA DS) 800-160 MG TABLET    TAKE 1 TABLET BY MOUTH DAILY  Modified Medications   No medications on file  Discontinued Medications   No medications on file    Subjective: Patrick Huber is in for his routine HIV follow-up visit.  He has had no problems obtaining, taking or tolerating his Biktarvy or trimethoprim sulfamethoxazole and has not been missing any doses.  His disability was approved last week.  He is feeling much better.  His appetite has improved and he is gained about 40 pounds since discharge from the hospital last September.  His psoriasis has resolved.  His joint pain and swelling is improving.  He does still have some mild pain in both elbows.  He is also still having some fairly severe pain in his right ankle.  However, he notes more good days than bad.  Yesterday he was able to walk outside without much pain and did not require his cane or crutch.  He is still smoking 1 pack of cigarettes daily.  Review of Systems: Review of  Systems  Constitutional: Negative for chills, diaphoresis, fever, malaise/fatigue and weight loss.  HENT: Negative for sore throat.   Respiratory: Negative for cough, sputum production and shortness of breath.   Cardiovascular: Negative for chest pain.  Gastrointestinal: Negative for abdominal pain, diarrhea, heartburn, nausea and vomiting.  Genitourinary: Negative for dysuria and frequency.  Musculoskeletal: Positive for joint pain. Negative for myalgias.  Skin: Negative for rash.  Neurological: Negative for dizziness and headaches.  Psychiatric/Behavioral: Negative for depression and substance abuse. The patient is not nervous/anxious.     Past Medical History:  Diagnosis Date  . Angina pectoris ([place])   . HIV (human immunodeficiency virus infection) ([place])     Social History   Tobacco Use  . Smoking status: Current Every Day Smoker    Packs/day: 1.00    Years: 25.00    Pack years: 25.00    Types: Cigarettes  . Smokeless tobacco: Never Used  Substance Use Topics  . Alcohol use: Not Currently  . Drug use: Never    No family history on file.  Allergies  Allergen Reactions  . Meperidine Anaphylaxis    Health Maintenance  Topic Date Due  . TETANUS/TDAP  10/28/1990  . INFLUENZA VACCINE  07/02/2018  . HIV Screening  Completed    Objective:  Vitals:   01/11/19 1059  BP: 118/78  Pulse: 76  Temp: (!) 97.5 F (36.4 C)  Weight: 182 lb (82.6 kg)   Body mass index is  24.01 kg/m.  Physical Exam Constitutional:      Comments: His weight is up and he is obviously feeling much better.  He is still walking with the aid of a single crutch.  HENT:     Mouth/Throat:     Pharynx: No oropharyngeal exudate.  Eyes:     Conjunctiva/sclera: Conjunctivae normal.  Cardiovascular:     Rate and Rhythm: Normal rate and regular rhythm.     Heart sounds: No murmur.  Pulmonary:     Effort: Pulmonary effort is normal.     Breath sounds: Normal breath sounds.  Abdominal:      Palpations: Abdomen is soft. There is no mass.     Tenderness: There is no abdominal tenderness.  Musculoskeletal: Normal range of motion.     Comments: The swelling of his elbows and ankles and knees have resolved.  He still has pain with range of motion of his right ankle and to a lesser degree, his elbows.  Skin:    Findings: No rash.     Comments: The psoriasis on his scalp and on his elbows has resolved and his hair is growing back in.  Neurological:     Mental Status: He is alert and oriented to person, place, and time.  Psychiatric:        Mood and Affect: Mood normal.     Lab Results Lab Results  Component Value Date   WBC 4.0 12/28/2018   HGB 12.6 (L) 12/28/2018   HCT 37.5 (L) 12/28/2018   MCV 88.2 12/28/2018   PLT 253 12/28/2018    Lab Results  Component Value Date   CREATININE 0.71 12/28/2018   BUN 14 12/28/2018   NA 137 12/28/2018   K 4.4 12/28/2018   CL 104 12/28/2018   CO2 28 12/28/2018    Lab Results  Component Value Date   ALT 9 12/28/2018   AST 10 12/28/2018   ALKPHOS 168 (H) 08/17/2018   BILITOT 0.3 12/28/2018    Lab Results  Component Value Date   CHOL 150 12/28/2018   HDL 26 (L) 12/28/2018   LDLCALC 87 12/28/2018   TRIG 284 (H) 12/28/2018   CHOLHDL 5.8 (H) 12/28/2018   Lab Results  Component Value Date   LABRPR REACTIVE (A) 12/28/2018   RPRTITER 1:32 (H) 12/28/2018   HIV 1 RNA Quant (copies/mL)  Date Value  12/28/2018 <20 NOT DETECTED  11/09/2018 <20 NOT DETECTED  09/07/2018 39 (H)   CD4 T Cell Abs (/uL)  Date Value  12/28/2018 80 (L)  11/09/2018 80 (L)  09/07/2018 30 (L)     Problem List Items Addressed This Visit      High   HIV disease ([place])    His infection has come under very good control since starting Biktarvy 6 months ago.  He is having slow CD4 reconstitution.  I will have him continue Biktarvy and trimethoprim sulfamethoxazole and follow-up after lab work in 3 months.      Relevant Orders   T-helper cell (CD4)-  ([facility] clinic only)   HIV-1 RNA quant-no reflex-bld   CBC   Comprehensive metabolic panel   RPR     Unprioritized   Unintentional weight loss    He has regained most of his lost weight and hopes that he can stay right where he is now.  Fortunately he is able to be much more active now that his joint symptoms have improved.      Psoriasis    His psoriasis has resolved  as his HIV infection has come under good control.      Polyarthralgia    His joint swelling and pain is improving slowly.  I expect that it will continue to improve as his infection responds to treatment.      Late latent syphilis    His RPR remains unchanged at 1: 32.  Interestingly, his FTA has converted back to nonreactive.  I will not retreat him at this time.  I will repeat his RPR in 3 months.           Michel Bickers, MD [facility] for [facility] [place] [facility] [phone] pager   [phone] cell 01/11/2019, 1:02 PM

## 2019-01-11 NOTE — Assessment & Plan Note (Signed)
He has regained most of his lost weight and hopes that he can stay right where he is now.  Fortunately he is able to be much more active now that his joint symptoms have improved.

## 2019-02-11 ENCOUNTER — Other Ambulatory Visit: Payer: Self-pay | Admitting: Internal Medicine

## 2019-02-11 DIAGNOSIS — B20 Human immunodeficiency virus [HIV] disease: Secondary | ICD-10-CM

## 2019-02-11 MED ORDER — BICTEGRAVIR-EMTRICITAB-TENOFOV 50-200-25 MG PO TABS: 1.0000 | ORAL_TABLET | Freq: Every day | ORAL | 5 refills | Status: DC

## 2019-03-19 ENCOUNTER — Telehealth: Payer: Self-pay | Admitting: Internal Medicine

## 2019-03-19 NOTE — Telephone Encounter (Signed)
COVID-19 Pre-Screening Questions: ° °Do you currently have a fever (>100 °F), chills or unexplained body aches? No  ° °Are you currently experiencing new cough, shortness of breath, sore throat, runny nose? No  °•  °Have you recently travelled outside the state of [place] in the last 14 days? No  °•  °Have you been in contact with someone that is currently pending confirmation of Covid19 testing or has been confirmed to have the Covid19 virus?  No  °

## 2019-03-22 ENCOUNTER — Other Ambulatory Visit: Payer: Self-pay

## 2019-03-22 DIAGNOSIS — B20 Human immunodeficiency virus [HIV] disease: Secondary | ICD-10-CM

## 2019-03-23 LAB — T-HELPER CELL (CD4) - (RCID CLINIC ONLY)
CD4 % Helper T Cell: 7 % — ABNORMAL LOW (ref 33–55)
CD4 T Cell Abs: 90 /uL — ABNORMAL LOW (ref 400–2700)

## 2019-03-24 LAB — COMPREHENSIVE METABOLIC PANEL
AG Ratio: 1.2 (calc) (ref 1.0–2.5)
ALT: 10 U/L (ref 9–46)
AST: 10 U/L (ref 10–40)
Albumin: 4.3 g/dL (ref 3.6–5.1)
Alkaline phosphatase (APISO): 127 U/L (ref 36–130)
BUN: 8 mg/dL (ref 7–25)
CO2: 28 mmol/L (ref 20–32)
Calcium: 9.4 mg/dL (ref 8.6–10.3)
Chloride: 103 mmol/L (ref 98–110)
Creat: 0.8 mg/dL (ref 0.60–1.35)
Globulin: 3.5 g/dL (calc) (ref 1.9–3.7)
Glucose, Bld: 105 mg/dL — ABNORMAL HIGH (ref 65–99)
Potassium: 4 mmol/L (ref 3.5–5.3)
Sodium: 138 mmol/L (ref 135–146)
Total Bilirubin: 0.3 mg/dL (ref 0.2–1.2)
Total Protein: 7.8 g/dL (ref 6.1–8.1)

## 2019-03-24 LAB — CBC
HCT: 43.1 % (ref 38.5–50.0)
Hemoglobin: 14.9 g/dL (ref 13.2–17.1)
MCH: 31.4 pg (ref 27.0–33.0)
MCHC: 34.6 g/dL (ref 32.0–36.0)
MCV: 90.9 fL (ref 80.0–100.0)
MPV: 9.6 fL (ref 7.5–12.5)
Platelets: 224 10*3/uL (ref 140–400)
RBC: 4.74 10*6/uL (ref 4.20–5.80)
RDW: 14.7 % (ref 11.0–15.0)
WBC: 4.2 10*3/uL (ref 3.8–10.8)

## 2019-03-24 LAB — HIV-1 RNA QUANT-NO REFLEX-BLD
HIV 1 RNA Quant: <20 copies/mL — AB
HIV-1 RNA Quant, Log: <1.3 Log copies/mL — AB

## 2019-03-24 LAB — FLUORESCENT TREPONEMAL AB(FTA)-IGG-BLD: Fluorescent Treponemal ABS: REACTIVE — AB

## 2019-03-24 LAB — RPR: RPR Ser Ql: REACTIVE — AB

## 2019-03-24 LAB — RPR TITER: RPR Titer: 1:64 {titer} — ABNORMAL HIGH

## 2019-04-06 ENCOUNTER — Encounter: Payer: Self-pay | Admitting: Internal Medicine

## 2019-04-06 ENCOUNTER — Ambulatory Visit (INDEPENDENT_AMBULATORY_CARE_PROVIDER_SITE_OTHER): Payer: Self-pay | Admitting: Internal Medicine

## 2019-04-06 ENCOUNTER — Other Ambulatory Visit: Payer: Self-pay

## 2019-04-06 ENCOUNTER — Encounter: Payer: Medicaid Other | Admitting: Internal Medicine

## 2019-04-06 DIAGNOSIS — G629 Polyneuropathy, unspecified: Secondary | ICD-10-CM

## 2019-04-06 MED ORDER — PREGABALIN 200 MG PO CAPS: 200.0000 mg | ORAL_CAPSULE | Freq: Three times a day (TID) | ORAL | 11 refills | Status: DC

## 2019-04-06 NOTE — Addendum Note (Signed)
Addended by: Michel Bickers on: 04/06/2019 11:02 AM   Modules accepted: Orders

## 2019-04-06 NOTE — Progress Notes (Signed)
Virtual Visit via Telephone Note  I connected with Patrick Huber on 04/06/19 at  9:30 AM EDT by telephone and verified that I am speaking with the correct person using two identifiers.  Location: Patient: Home Provider: [facility] for [facility]   I discussed the limitations, risks, security and privacy concerns of performing an evaluation and management service by telephone and the availability of in person appointments. I also discussed with the patient that there may be a patient responsible charge related to this service. The patient expressed understanding and agreed to proceed.   History of Present Illness: I called and spoke with Patrick Huber by phone today.  He has had no problems obtaining, taking or tolerating his Biktarvy.  He denies missing any doses other than 3 days last month when his pharmacy was late refilling it.  He is also taking his trimethoprim sulfamethoxazole and pregabalin.  He is doing well other than continued pain in his feet and legs.   Observations/Objective: HIV 1 RNA Quant (copies/mL)  Date Value  03/22/2019 <20 DETECTED (A)  12/28/2018 <20 NOT DETECTED  11/09/2018 <20 NOT DETECTED   CD4 T Cell Abs (/uL)  Date Value  03/22/2019 90 (L)  12/28/2018 80 (L)  11/09/2018 80 (L)    Assessment and Plan: His HIV infection is under excellent control and he is having slow CD4 reconstitution.  He will continue Biktarvy and follow-up in 3 months.  Because of his persistent pain in his feet and legs remains uncertain.  I am not absolutely certain that it is HIV related neuropathy but will increase the dose of his pregabalin.  His RPR remains 1:64.  I will consider re-treatment for late latent syphilis at the time of his next visit.  Follow Up Instructions: Continue Biktarvy and follow-up in 3 months Continue trimethoprim sulfamethoxazole prophylaxis Increase pregabalin to 200 mg 3 times daily   I discussed the assessment and treatment plan with the  patient. The patient was provided an opportunity to ask questions and all were answered. The patient agreed with the plan and demonstrated an understanding of the instructions.   The patient was advised to call back or seek an in-person evaluation if the symptoms worsen or if the condition fails to improve as anticipated.  I provided 16 minutes of non-face-to-face time during this encounter.   Michel Bickers, MD

## 2019-05-26 ENCOUNTER — Other Ambulatory Visit: Payer: Self-pay

## 2019-05-26 DIAGNOSIS — B20 Human immunodeficiency virus [HIV] disease: Secondary | ICD-10-CM

## 2019-06-05 DIAGNOSIS — R55 Syncope and collapse: Secondary | ICD-10-CM | POA: Diagnosis not present

## 2019-06-06 DIAGNOSIS — R55 Syncope and collapse: Secondary | ICD-10-CM | POA: Diagnosis not present

## 2019-06-23 ENCOUNTER — Other Ambulatory Visit: Payer: Medicaid Other

## 2019-06-23 ENCOUNTER — Other Ambulatory Visit: Payer: Self-pay

## 2019-06-23 DIAGNOSIS — B20 Human immunodeficiency virus [HIV] disease: Secondary | ICD-10-CM

## 2019-06-24 LAB — T-HELPER CELLS (CD4) COUNT (NOT AT ARMC)
CD4 % Helper T Cell: 8 % — ABNORMAL LOW (ref 33–65)
CD4 T Cell Abs: 123 /uL — ABNORMAL LOW (ref 400–1790)

## 2019-06-28 LAB — COMPREHENSIVE METABOLIC PANEL
AG Ratio: 1.4 (calc) (ref 1.0–2.5)
ALT: 18 U/L (ref 9–46)
AST: 15 U/L (ref 10–40)
Albumin: 4.2 g/dL (ref 3.6–5.1)
Alkaline phosphatase (APISO): 105 U/L (ref 36–130)
BUN: 11 mg/dL (ref 7–25)
CO2: 25 mmol/L (ref 20–32)
Calcium: 9 mg/dL (ref 8.6–10.3)
Chloride: 103 mmol/L (ref 98–110)
Creat: 1.04 mg/dL (ref 0.60–1.35)
Globulin: 3.1 g/dL (calc) (ref 1.9–3.7)
Glucose, Bld: 88 mg/dL (ref 65–99)
Potassium: 4.1 mmol/L (ref 3.5–5.3)
Sodium: 136 mmol/L (ref 135–146)
Total Bilirubin: 0.4 mg/dL (ref 0.2–1.2)
Total Protein: 7.3 g/dL (ref 6.1–8.1)

## 2019-06-28 LAB — CBC WITH DIFFERENTIAL/PLATELET
Absolute Monocytes: 284 cells/uL (ref 200–950)
Basophils Absolute: 39 cells/uL (ref 0–200)
Basophils Relative: 0.8 %
Eosinophils Absolute: 132 cells/uL (ref 15–500)
Eosinophils Relative: 2.7 %
HCT: 40.2 % (ref 38.5–50.0)
Hemoglobin: 14.5 g/dL (ref 13.2–17.1)
Lymphs Abs: 1583 cells/uL (ref 850–3900)
MCH: 32.4 pg (ref 27.0–33.0)
MCHC: 36.1 g/dL — ABNORMAL HIGH (ref 32.0–36.0)
MCV: 89.7 fL (ref 80.0–100.0)
MPV: 9 fL (ref 7.5–12.5)
Monocytes Relative: 5.8 %
Neutro Abs: 2862 cells/uL (ref 1500–7800)
Neutrophils Relative %: 58.4 %
Platelets: 204 10*3/uL (ref 140–400)
RBC: 4.48 10*6/uL (ref 4.20–5.80)
RDW: 14.6 % (ref 11.0–15.0)
Total Lymphocyte: 32.3 %
WBC: 4.9 10*3/uL (ref 3.8–10.8)

## 2019-06-28 LAB — HIV-1 RNA QUANT-NO REFLEX-BLD
HIV 1 RNA Quant: <20 copies/mL
HIV-1 RNA Quant, Log: <1.3 Log copies/mL

## 2019-07-07 ENCOUNTER — Ambulatory Visit: Payer: Self-pay | Admitting: Internal Medicine

## 2019-07-12 ENCOUNTER — Telehealth: Payer: Self-pay

## 2019-07-12 ENCOUNTER — Ambulatory Visit: Payer: Self-pay | Admitting: Internal Medicine

## 2019-07-12 NOTE — Telephone Encounter (Signed)
Incoming fax from [employer] prior authorization needed for Pregabalin 200mg  Capsules take one capsule by mouth three times daily quantiy 90.  [place] [address]  ID is [id number] S Spoke with Janett Billow in [place] will review with 24 hour turn around time reference number for this call [id number]. LPN will call pharmacy around this time tomorrow to check claim status.  Eugenia Mcalpine, LPN

## 2019-07-13 ENCOUNTER — Other Ambulatory Visit: Payer: Self-pay | Admitting: Internal Medicine

## 2019-07-13 DIAGNOSIS — G629 Polyneuropathy, unspecified: Secondary | ICD-10-CM

## 2019-07-13 MED ORDER — GABAPENTIN 100 MG PO CAPS: 100.0000 mg | ORAL_CAPSULE | Freq: Three times a day (TID) | ORAL | 3 refills | Status: DC

## 2019-07-13 NOTE — Telephone Encounter (Signed)
I changed him to gabapentin and send in a prescription.  Also please schedule a follow-up with Tracen in my first available slot.  He missed his appointment recently.

## 2019-07-13 NOTE — Telephone Encounter (Signed)
PA-status update denied. Insurance prefers  Gabapentin or Duloxetine tried prior to Pregabalin.  Routing to provider to make aware. Patient and provider will also be made aware within 7-10 business days via certified mail. Eugenia Mcalpine, LPN

## 2019-07-14 NOTE — Telephone Encounter (Signed)
Patient called and made aware of medication changes per Dr. Megan Salon d/c Pregabalin and start Gabapentin 100mg  take 1 capsule by mouth three (3) times daily. LPN also rescheduled patient's recently missed appointment with Dr. Megan Salon. Patient satisfied with call and confirmed new appointment date/time. Eugenia Mcalpine, LPN

## 2019-07-15 ENCOUNTER — Ambulatory Visit: Payer: Medicaid Other | Admitting: Internal Medicine

## 2019-08-06 ENCOUNTER — Other Ambulatory Visit: Payer: Self-pay | Admitting: Internal Medicine

## 2019-08-06 DIAGNOSIS — B20 Human immunodeficiency virus [HIV] disease: Secondary | ICD-10-CM

## 2019-09-02 ENCOUNTER — Ambulatory Visit (INDEPENDENT_AMBULATORY_CARE_PROVIDER_SITE_OTHER): Payer: Medicaid Other | Admitting: Internal Medicine

## 2019-09-02 ENCOUNTER — Encounter: Payer: Self-pay | Admitting: Internal Medicine

## 2019-09-02 ENCOUNTER — Other Ambulatory Visit: Payer: Self-pay

## 2019-09-02 DIAGNOSIS — R634 Abnormal weight loss: Secondary | ICD-10-CM | POA: Diagnosis not present

## 2019-09-02 DIAGNOSIS — A528 Late syphilis, latent: Secondary | ICD-10-CM | POA: Diagnosis not present

## 2019-09-02 DIAGNOSIS — Z23 Encounter for immunization: Secondary | ICD-10-CM | POA: Diagnosis not present

## 2019-09-02 DIAGNOSIS — M2550 Pain in unspecified joint: Secondary | ICD-10-CM

## 2019-09-02 DIAGNOSIS — B20 Human immunodeficiency virus [HIV] disease: Secondary | ICD-10-CM

## 2019-09-02 DIAGNOSIS — G629 Polyneuropathy, unspecified: Secondary | ICD-10-CM | POA: Diagnosis not present

## 2019-09-02 DIAGNOSIS — F17210 Nicotine dependence, cigarettes, uncomplicated: Secondary | ICD-10-CM | POA: Diagnosis not present

## 2019-09-02 DIAGNOSIS — L409 Psoriasis, unspecified: Secondary | ICD-10-CM

## 2019-09-02 MED ORDER — GABAPENTIN 300 MG PO CAPS: 300.0000 mg | ORAL_CAPSULE | Freq: Three times a day (TID) | ORAL | 11 refills | Status: DC

## 2019-09-02 NOTE — Assessment & Plan Note (Signed)
His weight loss has self corrected with treatment of his HIV infection.

## 2019-09-02 NOTE — Progress Notes (Signed)
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    Priority: Medium   Anemia 11/09/2018   Elevated liver enzymes 11/09/2018   Neuropathy 10/07/2018   Bilateral lower extremity edema 09/08/2018   Malnutrition of moderate degree 08/18/2018   Unintentional weight loss 08/18/2018   Polyarthralgia 08/18/2018   Knee mass, right 08/18/2018   Pancytopenia ([place]) 08/18/2018   Cigarette smoker 08/18/2018   GERD (gastroesophageal reflux disease) 08/18/2018   Psoriasis 08/18/2018   History of MRSA infection 08/18/2018    Patient's Medications  New Prescriptions   No medications on file  Previous Medications   BIKTARVY 50-200-25 MG TABS TABLET    TAKE 1 TABLET BY MOUTH DAILY   SULFAMETHOXAZOLE-TRIMETHOPRIM (BACTRIM DS) 800-160 MG TABLET    TAKE 1 TABLET BY MOUTH DAILY  Modified Medications   Modified Medication Previous Medication   GABAPENTIN (NEURONTIN) 300 MG CAPSULE gabapentin (NEURONTIN) 100 MG capsule      Take 1 capsule (300 mg total) by mouth 3 (three) times daily.    Take 1 capsule (100 mg total) by mouth 3 (three) times daily.  Discontinued Medications   No medications on file    Subjective: Shaundell is in for his routine HIV follow-up visit.  He has had no problems obtaining, taking or tolerating his Biktarvy and never misses a single dose.  He also continues to take his Neurontin and Trimethoprim/Sulfamethoxazole.  He does not have a pillbox to use.  Overall he is feeling much better.  He has been able to get out and do some walking and work on his trunk.  He reminds me that he had been on Lyrica but we had to switch him to Neurontin.  I checked him this was because Medicaid required prior authorization of Lyrica.  He said that Lyrica worked much better than the Neurontin for his peripheral neuropathy.  His psoriasis has resolved.  His appetite has improved and he has  regained all of the weight he lost and more.  He is not in a current relationship and has not been sexually active since his diagnosis 1 year ago.  He continues to smoke cigarettes but says that he has cut down.  He is not considering setting a quit date.  Review of Systems: Review of Systems  Constitutional: Negative for chills, diaphoresis, fever, malaise/fatigue and weight loss.  HENT: Negative for sore throat.   Respiratory: Negative for cough, sputum production and shortness of breath.   Cardiovascular: Negative for chest pain.  Gastrointestinal: Negative for abdominal pain, diarrhea, heartburn, nausea and vomiting.  Genitourinary: Negative for dysuria and frequency.  Musculoskeletal: Positive for joint pain. Negative for myalgias.  Skin: Negative for rash.  Neurological: Positive for sensory change. Negative for dizziness and headaches.  Psychiatric/Behavioral: Negative for depression and substance abuse. The patient is not nervous/anxious.     Past Medical History:  Diagnosis Date   Angina pectoris ([place])    HIV (human immunodeficiency virus infection) ([place])     Social History   Tobacco Use   Smoking status: Current Every Day Smoker    Packs/day: 1.00    Years: 25.00    Pack years: 25.00    Types: Cigarettes   Smokeless tobacco: Never Used  Substance Use Topics   Alcohol use: Not Currently   Drug use: Never    No family history on file.  Allergies  Allergen Reactions   Meperidine Anaphylaxis    Health Maintenance  Topic Date Due   TETANUS/TDAP  10/28/1990   INFLUENZA VACCINE  07/03/2019   HIV Screening  Completed    Objective:  Vitals:   09/02/19 1534  BP: (!) 144/82  Pulse: 96  Temp: (!) 97.5 F (36.4 C)  TempSrc: Oral   There is no height or weight on file to calculate BMI.  Physical Exam Constitutional:      Comments: He looks dramatically better than he did 1 year ago.  HENT:     Mouth/Throat:     Pharynx: No oropharyngeal  exudate.  Eyes:     Conjunctiva/sclera: Conjunctivae normal.  Cardiovascular:     Rate and Rhythm: Normal rate and regular rhythm.     Heart sounds: No murmur.  Pulmonary:     Effort: Pulmonary effort is normal.     Breath sounds: Normal breath sounds.  Abdominal:     Palpations: Abdomen is soft. There is no mass.     Tenderness: There is no abdominal tenderness.  Musculoskeletal: Normal range of motion.     Comments: The diffuse swelling he had around his knees and elbows has resolved.  Skin:    Findings: No rash.     Comments: Thick scaling plaques he had in his scalp and on his elbows are completely gone.  Neurological:     Mental Status: He is alert and oriented to person, place, and time.  Psychiatric:        Mood and Affect: Mood normal.     Lab Results Lab Results  Component Value Date   WBC 4.9 06/23/2019   HGB 14.5 06/23/2019   HCT 40.2 06/23/2019   MCV 89.7 06/23/2019   PLT 204 06/23/2019    Lab Results  Component Value Date   CREATININE 1.04 06/23/2019   BUN 11 06/23/2019   NA 136 06/23/2019   K 4.1 06/23/2019   CL 103 06/23/2019   CO2 25 06/23/2019    Lab Results  Component Value Date   ALT 18 06/23/2019   AST 15 06/23/2019   ALKPHOS 168 (H) 08/17/2018   BILITOT 0.4 06/23/2019    Lab Results  Component Value Date   CHOL 150 12/28/2018   HDL 26 (L) 12/28/2018   LDLCALC 87 12/28/2018   TRIG 284 (H) 12/28/2018   CHOLHDL 5.8 (H) 12/28/2018   Lab Results  Component Value Date   LABRPR REACTIVE (A) 03/22/2019   RPRTITER 1:64 (H) 03/22/2019   HIV 1 RNA Quant (copies/mL)  Date Value  06/23/2019 <20 NOT DETECTED  03/22/2019 <20 DETECTED (A)  12/28/2018 <20 NOT DETECTED   CD4 T Cell Abs (/uL)  Date Value  06/23/2019 123 (L)  03/22/2019 90 (L)  12/28/2018 80 (L)     Problem List Items Addressed This Visit      High   HIV disease ([place])    His infection has come under much better control since starting Biktarvy 1 year ago when he is having  slow but steady CD4 reconstitution.  He will continue Biktarvy and prophylactic trimethoprim sulfamethoxazole.  He will get repeat blood work today and follow-up in 3 months.      Relevant Orders   T-helper cell (CD4)- ([facility] clinic only)   HIV-1 RNA quant-no reflex-bld   CBC   Comprehensive metabolic panel     Medium   Late latent syphilis    His syphilis serologies have been a little bit confusing.  When he was diagnosed with probable late latent syphilis 1 year ago his RPR was positive at 1:32 and his treponemal antibodies were positive.  I will always had some concerned that his 3 doses of benzathine penicillin may have not been spaced correctly.  When his labs were repeated and January his RPR was unchanged but his treponemal antibodies were negative.  More recently his RPR has gone up to 1:64.  I will repeat lab work today.  If his titer is still elevated and his antibodies are positive I will consider re-treatment for late latent syphilis.      Relevant Orders   RPR     Unprioritized   Unintentional weight loss    His weight loss has self corrected with treatment of his HIV infection.      Psoriasis    His psoriasis has resolved with treatment of his HIV infection.      Polyarthralgia    His polyarthralgias have resolved with treatment of his HIV infection.  He still has some milder joint pains which are probably related to osteoarthritis and his peripheral neuropathy.      Neuropathy   Relevant Medications   gabapentin (NEURONTIN) 300 MG capsule   Cigarette smoker    I talked to him today about the importance of continuing to work towards complete cigarette cessation.       Other Visit Diagnoses    Need for immunization against influenza       Relevant Orders   Flu Vaccine QUAD 36+ mos IM (Completed)        Michel Bickers, MD [facility] for [place] [phone] pager   [phone] cell 09/02/2019, 4:14 PM

## 2019-09-02 NOTE — Assessment & Plan Note (Signed)
His psoriasis has resolved with treatment of his HIV infection. 

## 2019-09-02 NOTE — Assessment & Plan Note (Signed)
His infection has come under much better control since starting [place] 1 year ago when he is having slow but steady CD4 reconstitution.  He will continue Biktarvy and prophylactic trimethoprim sulfamethoxazole.  He will get repeat blood work today and follow-up in 3 months.

## 2019-09-02 NOTE — Assessment & Plan Note (Signed)
His syphilis serologies have been a little bit confusing.  When he was diagnosed with probable late latent syphilis 1 year ago his RPR was positive at 1:32 and his treponemal antibodies were positive.  I will always had some concerned that his 3 doses of benzathine penicillin may have not been spaced correctly.  When his labs were repeated and January his RPR was unchanged but his treponemal antibodies were negative.  More recently his RPR has gone up to 1:64.  I will repeat lab work today.  If his titer is still elevated and his antibodies are positive I will consider re-treatment for late latent syphilis.

## 2019-09-02 NOTE — Assessment & Plan Note (Addendum)
His polyarthralgias have resolved with treatment of his HIV infection.  He still has some milder joint pains which are probably related to osteoarthritis and his peripheral neuropathy.

## 2019-09-02 NOTE — Assessment & Plan Note (Signed)
I talked to him today about the importance of continuing to work towards complete cigarette cessation.

## 2019-09-03 LAB — T-HELPER CELL (CD4) - (RCID CLINIC ONLY)
CD4 % Helper T Cell: 9 % — ABNORMAL LOW (ref 33–65)
CD4 T Cell Abs: 139 /uL — ABNORMAL LOW (ref 400–1790)

## 2019-09-05 LAB — RPR TITER: RPR Titer: 1:16 {titer} — ABNORMAL HIGH

## 2019-09-05 LAB — COMPREHENSIVE METABOLIC PANEL
AG Ratio: 1.3 (calc) (ref 1.0–2.5)
ALT: 17 U/L (ref 9–46)
AST: 13 U/L (ref 10–40)
Albumin: 4.1 g/dL (ref 3.6–5.1)
Alkaline phosphatase (APISO): 97 U/L (ref 36–130)
BUN: 12 mg/dL (ref 7–25)
CO2: 25 mmol/L (ref 20–32)
Calcium: 9.2 mg/dL (ref 8.6–10.3)
Chloride: 105 mmol/L (ref 98–110)
Creat: 0.98 mg/dL (ref 0.60–1.35)
Globulin: 3.2 g/dL (calc) (ref 1.9–3.7)
Glucose, Bld: 94 mg/dL (ref 65–99)
Potassium: 3.8 mmol/L (ref 3.5–5.3)
Sodium: 138 mmol/L (ref 135–146)
Total Bilirubin: 0.4 mg/dL (ref 0.2–1.2)
Total Protein: 7.3 g/dL (ref 6.1–8.1)

## 2019-09-05 LAB — HIV-1 RNA QUANT-NO REFLEX-BLD
HIV 1 RNA Quant: <20 copies/mL
HIV-1 RNA Quant, Log: <1.3 Log copies/mL

## 2019-09-05 LAB — RPR: RPR Ser Ql: REACTIVE — AB

## 2019-09-05 LAB — CBC
HCT: 42.8 % (ref 38.5–50.0)
Hemoglobin: 14.7 g/dL (ref 13.2–17.1)
MCH: 31.7 pg (ref 27.0–33.0)
MCHC: 34.3 g/dL (ref 32.0–36.0)
MCV: 92.4 fL (ref 80.0–100.0)
MPV: 9.3 fL (ref 7.5–12.5)
Platelets: 215 10*3/uL (ref 140–400)
RBC: 4.63 10*6/uL (ref 4.20–5.80)
RDW: 14.1 % (ref 11.0–15.0)
WBC: 4.5 10*3/uL (ref 3.8–10.8)

## 2019-09-05 LAB — FLUORESCENT TREPONEMAL AB(FTA)-IGG-BLD: Fluorescent Treponemal ABS: REACTIVE — AB

## 2019-10-13 IMAGING — DX DG CHEST 1V PORT
1 series · 2 of 2 positions shown · non-contrast
Comparison: 04/27/2017

CLINICAL DATA: Increased lung sounds in the basis

EXAM:
PORTABLE CHEST 1 VIEW

[Series 1: chest ap · 0.14mm/px · 2 of 2 slices shown]
[im 1/2]
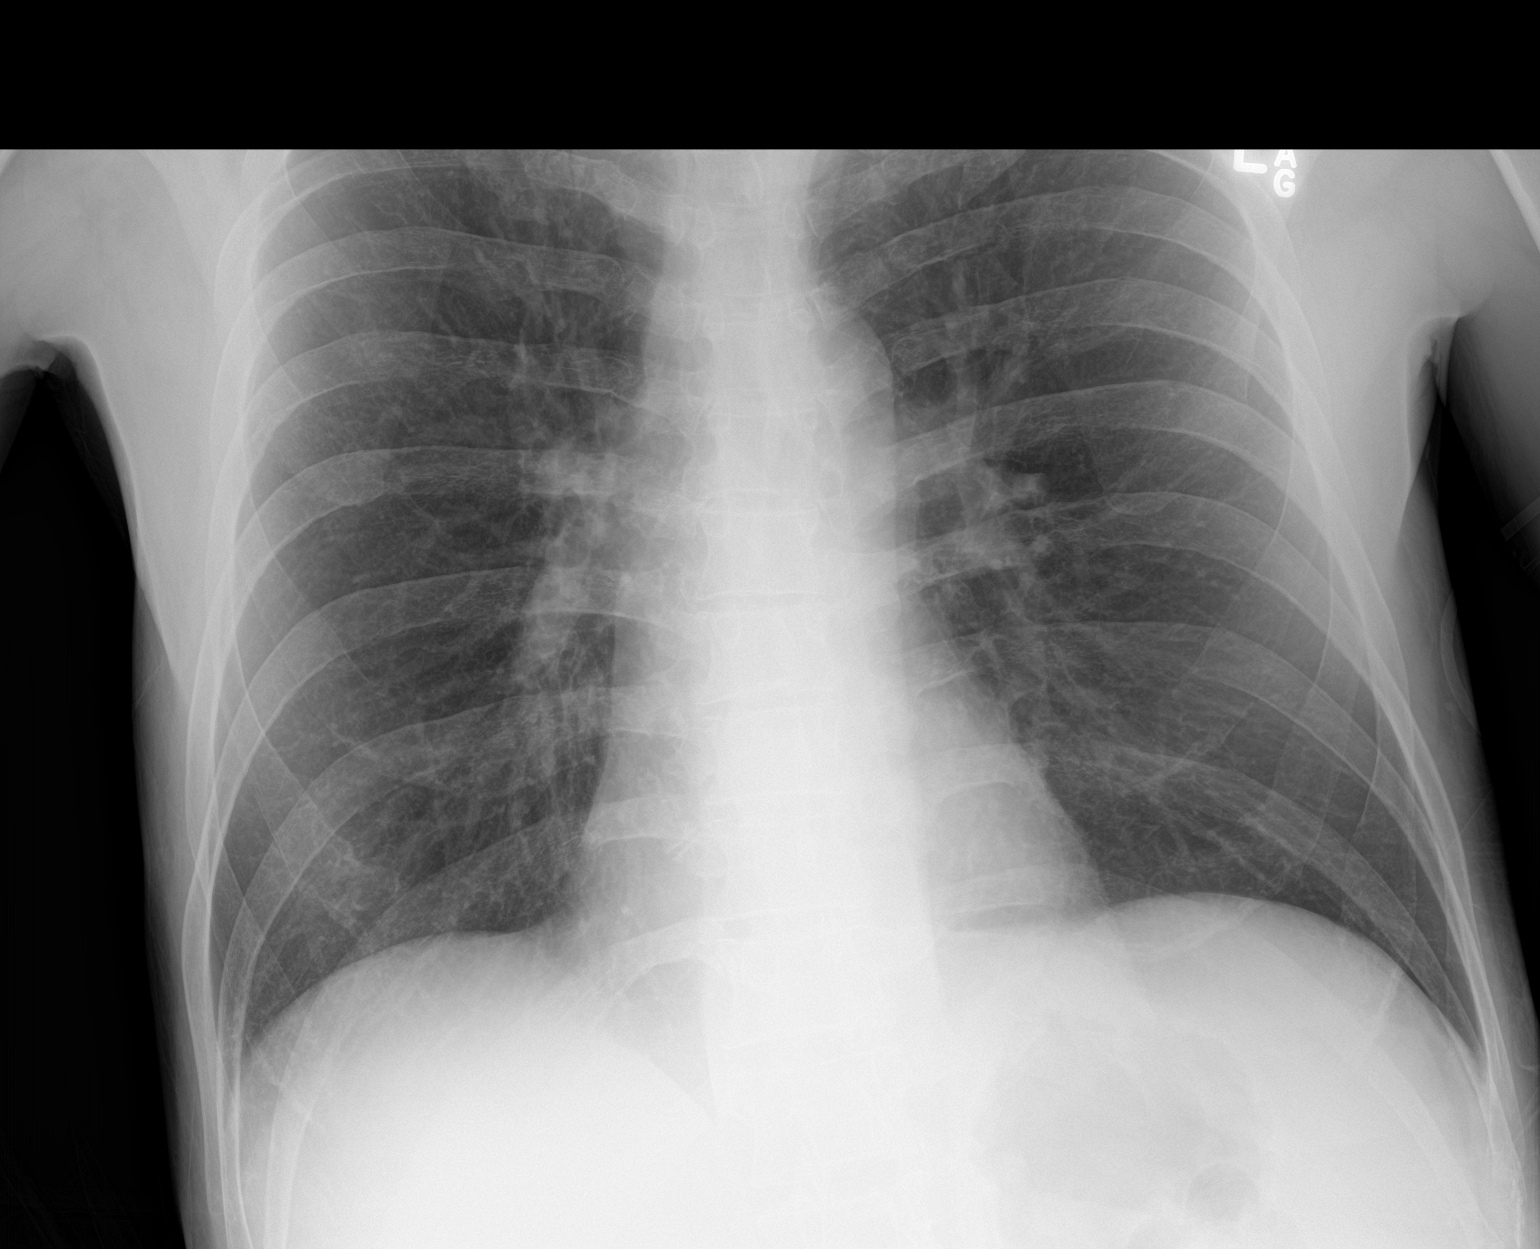
[im 2/2]
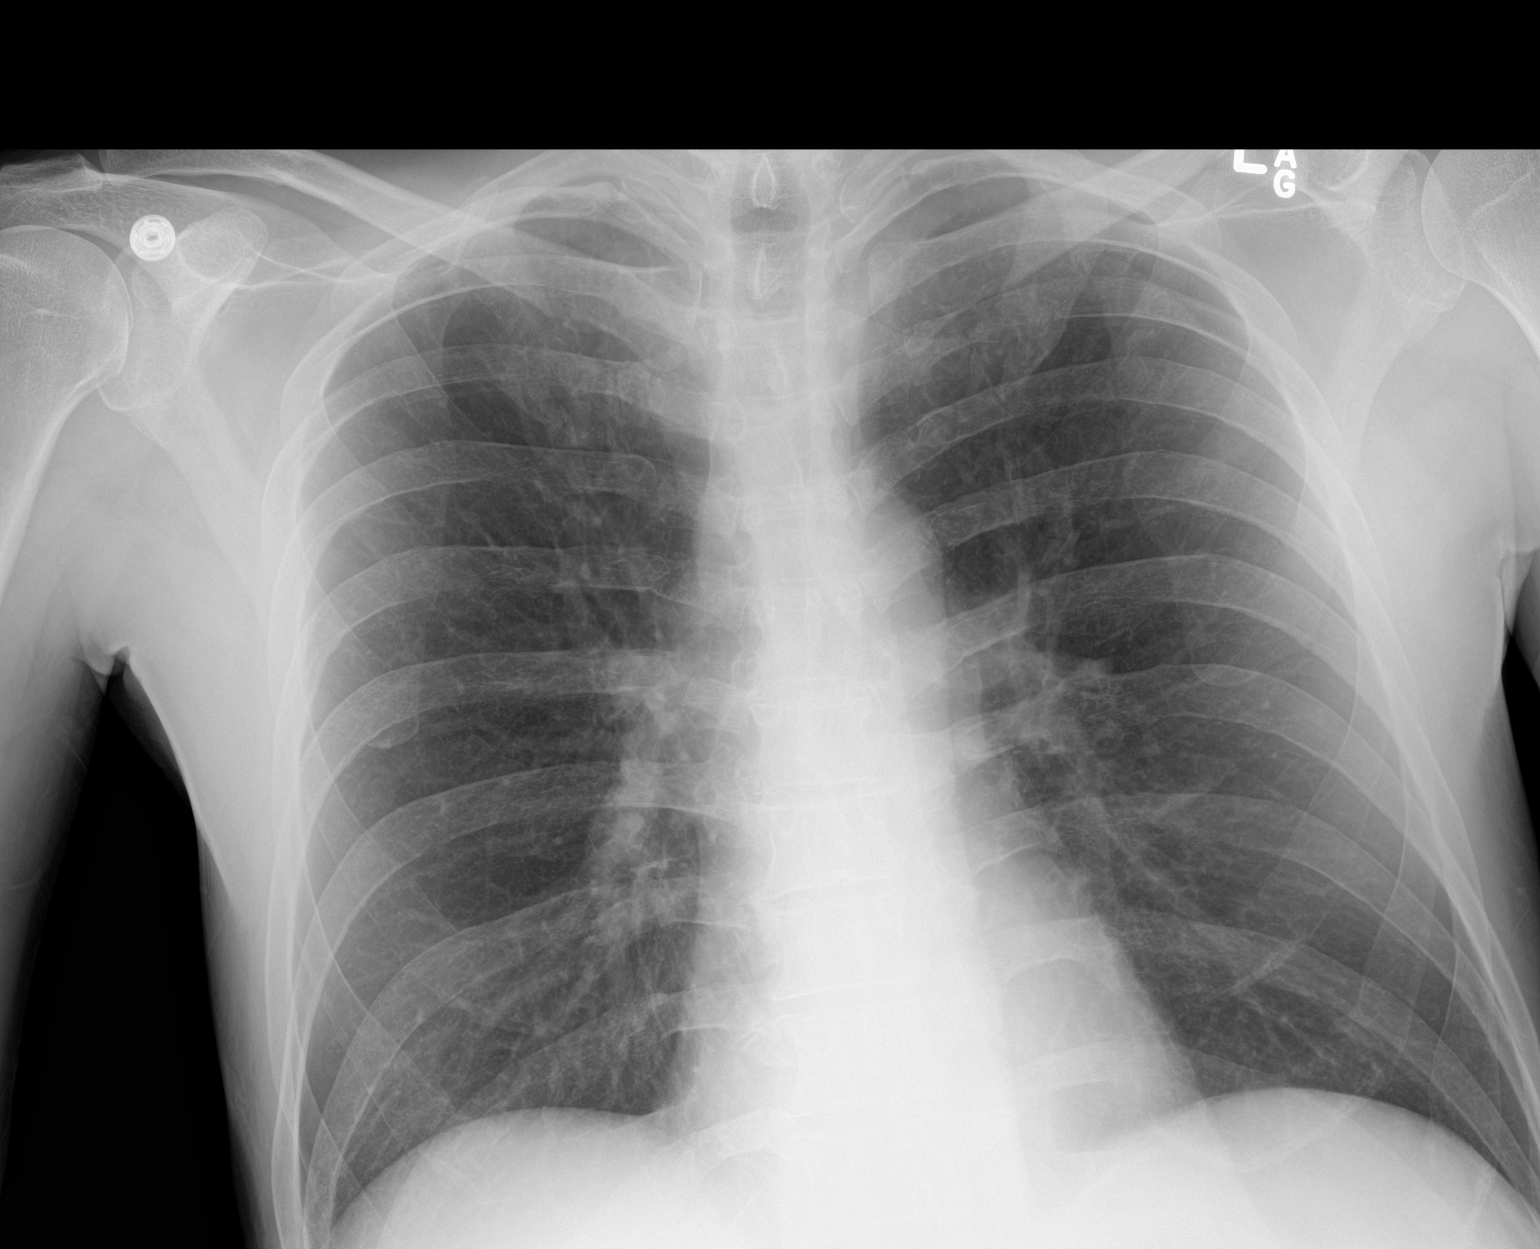

[2 of 2 positions shown; findings below may reference images not displayed]

FINDINGS: Cardiac shadows within normal limits. The lungs are well aerated
bilaterally. No focal infiltrate or sizable effusion is seen. No
acute bony abnormality is noted.
IMPRESSION: No active disease.

## 2019-12-02 ENCOUNTER — Other Ambulatory Visit: Payer: Medicaid Other

## 2019-12-20 ENCOUNTER — Ambulatory Visit (INDEPENDENT_AMBULATORY_CARE_PROVIDER_SITE_OTHER): Payer: Medicaid Other | Admitting: Internal Medicine

## 2019-12-20 ENCOUNTER — Other Ambulatory Visit: Payer: Self-pay

## 2019-12-20 DIAGNOSIS — B20 Human immunodeficiency virus [HIV] disease: Secondary | ICD-10-CM | POA: Diagnosis not present

## 2019-12-20 NOTE — Progress Notes (Signed)
Virtual Visit via Telephone Note  I connected with Patrick Huber on 12/20/19 at  2:00 PM EST by telephone and verified that I am speaking with the correct person using two identifiers.  Location: Patient: Home Provider: [facility]   I discussed the limitations, risks, security and privacy concerns of performing an evaluation and management service by telephone and the availability of in person appointments. I also discussed with the patient that there may be a patient responsible charge related to this service. The patient expressed understanding and agreed to proceed.   History of Present Illness: I called and spoke with Patrick Huber this afternoon.  He has not had any problems obtaining, taking or tolerating his Biktarvy and does not recall missing doses.  He is still having problems with pain and swelling particularly in his right knee and right foot.  Otherwise he is doing steadily better.   Observations/Objective: HIV 1 RNA Quant (copies/mL)  Date Value  09/02/2019 <20 NOT DETECTED  06/23/2019 <20 NOT DETECTED  03/22/2019 <20 DETECTED (A)   CD4 T Cell Abs (/uL)  Date Value  09/02/2019 139 (L)  06/23/2019 123 (L)  03/22/2019 90 (L)    Assessment and Plan: His infection remains under excellent, long-term control.  He is having slow but steady CD4 reconstitution.  Follow Up Instructions: Continue Biktarvy and Trimethoprim/Sulfamethoxazole Follow-up after lab work in 6 months He will schedule a follow-up visit with his orthopedic surgeon   I discussed the assessment and treatment plan with the patient. The patient was provided an opportunity to ask questions and all were answered. The patient agreed with the plan and demonstrated an understanding of the instructions.   The patient was advised to call back or seek an in-person evaluation if the symptoms worsen or if the condition fails to improve as anticipated.  I provided 13 minutes of non-face-to-face time during this  encounter.   Michel Bickers, MD

## 2020-02-01 ENCOUNTER — Other Ambulatory Visit: Payer: Self-pay | Admitting: Internal Medicine

## 2020-02-01 DIAGNOSIS — B20 Human immunodeficiency virus [HIV] disease: Secondary | ICD-10-CM

## 2020-06-19 ENCOUNTER — Ambulatory Visit: Payer: Medicaid Other | Admitting: Internal Medicine

## 2020-06-19 ENCOUNTER — Telehealth: Payer: Self-pay

## 2020-06-19 NOTE — Telephone Encounter (Signed)
Spoke with patient to rescheduled missed appointment. Patient states his car broke down and patient accepted new appointment date/time on 07/10/20 at [place]

## 2020-07-07 ENCOUNTER — Telehealth: Payer: Self-pay

## 2020-07-07 NOTE — Telephone Encounter (Signed)
COVID-19 Pre-Screening Questions:07/07/20  Do you currently have a fever (>100 F), chills or unexplained body aches? NO   Are you currently experiencing new cough, shortness of breath, sore throat, runny nose?NO .  Have you recently travelled outside the state of [place] in the last 14 days? NO .  Have you been in contact with someone that is currently pending confirmation of Covid19 testing or has been confirmed to have the [place] virus? NO  **If the patient answers NO to ALL questions -  advise the patient to please call the clinic before coming to the office should any symptoms develop.

## 2020-07-10 ENCOUNTER — Ambulatory Visit: Payer: Medicaid Other | Admitting: Internal Medicine

## 2020-07-25 ENCOUNTER — Ambulatory Visit (INDEPENDENT_AMBULATORY_CARE_PROVIDER_SITE_OTHER): Payer: Medicaid Other | Admitting: Internal Medicine

## 2020-07-25 ENCOUNTER — Encounter: Payer: Self-pay | Admitting: Internal Medicine

## 2020-07-25 ENCOUNTER — Other Ambulatory Visit: Payer: Self-pay

## 2020-07-25 DIAGNOSIS — B20 Human immunodeficiency virus [HIV] disease: Secondary | ICD-10-CM

## 2020-07-25 DIAGNOSIS — A528 Late syphilis, latent: Secondary | ICD-10-CM

## 2020-07-25 DIAGNOSIS — M2550 Pain in unspecified joint: Secondary | ICD-10-CM

## 2020-07-25 DIAGNOSIS — L409 Psoriasis, unspecified: Secondary | ICD-10-CM

## 2020-07-25 DIAGNOSIS — R634 Abnormal weight loss: Secondary | ICD-10-CM | POA: Diagnosis not present

## 2020-07-25 MED ORDER — BIKTARVY 50-200-25 MG PO TABS: 1.0000 | ORAL_TABLET | Freq: Every day | ORAL | 11 refills | Status: DC

## 2020-07-25 NOTE — Assessment & Plan Note (Signed)
I wonder if he may have psoriatic arthritis.

## 2020-07-25 NOTE — Assessment & Plan Note (Signed)
His psoriasis has resolved with treatment of his HIV infection.

## 2020-07-25 NOTE — Assessment & Plan Note (Signed)
His weight loss has reversed with treatment of his HIV infection.

## 2020-07-25 NOTE — Progress Notes (Signed)
Patient Active Problem List   Diagnosis Date Noted  . HIV disease ([place]) 08/18/2018    Priority: High  . 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 Anemia 11/09/2018  . Elevated liver enzymes 11/09/2018  . Neuropathy 10/07/2018  . Bilateral lower extremity edema 09/08/2018  . Malnutrition of moderate degree 08/18/2018  . Unintentional weight loss 08/18/2018  . Polyarthralgia 08/18/2018  . Knee mass, right 08/18/2018  . Pancytopenia ([place]) 08/18/2018  . Cigarette smoker 08/18/2018  . GERD (gastroesophageal reflux disease) 08/18/2018  . Psoriasis 08/18/2018  . History of MRSA infection 08/18/2018    Patient's Medications  New Prescriptions   No medications on file  Previous Medications   GABAPENTIN (NEURONTIN) 300 MG CAPSULE    Take 1 capsule (300 mg total) by mouth 3 (three) times daily.   SULFAMETHOXAZOLE-TRIMETHOPRIM (BACTRIM DS) 800-160 MG TABLET    TAKE 1 TABLET BY MOUTH DAILY  Modified Medications   Modified Medication Previous Medication   BICTEGRAVIR-EMTRICITABINE-TENOFOVIR AF (BIKTARVY) 50-200-25 MG TABS TABLET BIKTARVY 50-200-25 MG TABS tablet      Take 1 tablet by mouth daily.    TAKE 1 TABLET BY MOUTH DAILY  Discontinued Medications   No medications on file    Subjective: Patrick Huber is in for his routine HIV follow-up visit.  He has not had any problems obtaining, taking or tolerating his Biktarvy.  He thinks he has missed only 1 or 2 doses in the past 6 months.  He does not want to take the Covid vaccines.  He is concerned that they are not FDA approved (I told him that the [place] vaccine is approved), possibly unsafe and ineffective.  He is feeling much better other than continued pain and stiffness in his elbows and knees.  He has not been able to find anyone willing to see him because of the Covid pandemic.  He is currently not sexually active.  Review of Systems: Review of Systems  Constitutional: Negative for chills, diaphoresis, fever,  malaise/fatigue and weight loss.  HENT: Negative for sore throat.   Respiratory: Negative for cough, sputum production and shortness of breath.   Cardiovascular: Negative for chest pain.  Gastrointestinal: Negative for abdominal pain, diarrhea, heartburn, nausea and vomiting.  Genitourinary: Negative for dysuria and frequency.  Musculoskeletal: Positive for joint pain. Negative for myalgias.  Skin: Negative for rash.  Neurological: Negative for dizziness and headaches.  Psychiatric/Behavioral: Negative for depression and substance abuse. The patient is not nervous/anxious.     Past Medical History:  Diagnosis Date  . Angina pectoris ([place])   . HIV (human immunodeficiency virus infection) ([place])     Social History   Tobacco Use  . Smoking status: Current Every Day Smoker    Packs/day: 1.00    Years: 25.00    Pack years: 25.00    Types: Cigarettes  . Smokeless tobacco: Never Used  Vaping Use  . Vaping Use: Never used  Substance Use Topics  . Alcohol use: Not Currently  . Drug use: Never    No family history on file.  Allergies  Allergen Reactions  . Meperidine Anaphylaxis    Health Maintenance  Topic Date Due  . COVID-19 Vaccine (1) Never done  . TETANUS/TDAP  Never done  . INFLUENZA VACCINE  07/02/2020  . Hepatitis C Screening  Completed  . HIV Screening  Completed    Objective:  Vitals:   07/25/20 1608  BP: 126/83  Pulse: 91  SpO2: 93%  Weight: 205 lb (93 kg)   Body mass index is 27.05 kg/m.  Physical Exam Constitutional:      Comments: He is in good spirits.  He has gained 55 pounds over the last 2 years.  Cardiovascular:     Rate and Rhythm: Normal rate and regular rhythm.     Heart sounds: Normal heart sounds.  Pulmonary:     Effort: Pulmonary effort is normal.     Breath sounds: Normal breath sounds.  Abdominal:     Palpations: Abdomen is soft.     Tenderness: There is no abdominal tenderness.  Musculoskeletal:        General: No swelling  or tenderness.     Comments: He cannot fully extend his arms because of stiffness in his elbows.  Skin:    Findings: No rash.     Comments: Her psoriasis has resolved.  Neurological:     General: No focal deficit present.  Psychiatric:        Mood and Affect: Mood normal.     Lab Results Lab Results  Component Value Date   WBC 4.5 09/02/2019   HGB 14.7 09/02/2019   HCT 42.8 09/02/2019   MCV 92.4 09/02/2019   PLT 215 09/02/2019    Lab Results  Component Value Date   CREATININE 0.98 09/02/2019   BUN 12 09/02/2019   NA 138 09/02/2019   K 3.8 09/02/2019   CL 105 09/02/2019   CO2 25 09/02/2019    Lab Results  Component Value Date   ALT 17 09/02/2019   AST 13 09/02/2019   ALKPHOS 168 (H) 08/17/2018   BILITOT 0.4 09/02/2019    Lab Results  Component Value Date   CHOL 150 12/28/2018   HDL 26 (L) 12/28/2018   LDLCALC 87 12/28/2018   TRIG 284 (H) 12/28/2018   CHOLHDL 5.8 (H) 12/28/2018   Lab Results  Component Value Date   LABRPR REACTIVE (A) 09/02/2019   RPRTITER 1:16 (H) 09/02/2019   HIV 1 RNA Quant (copies/mL)  Date Value  09/02/2019 <20 NOT DETECTED  06/23/2019 <20 NOT DETECTED  03/22/2019 <20 DETECTED (A)   CD4 T Cell Abs (/uL)  Date Value  09/02/2019 139 (L)  06/23/2019 123 (L)  03/22/2019 90 (L)     Problem List Items Addressed This Visit      High   HIV disease ([place])    His infection has come under much better long-term control since starting therapy 2 years ago.  He will get blood work today, continue Airline pilot and follow-up in 6 months.  I did encourage him to rethink his decision about getting the Covid vaccines.      Relevant Medications   bictegravir-emtricitabine-tenofovir AF (BIKTARVY) 50-200-25 MG TABS tablet   Other Relevant Orders   T-helper cell (CD4)- ([facility] clinic only)   HIV-1 RNA quant-no reflex-bld   CBC   Comprehensive metabolic panel   RPR   Lipid panel   T-helper cell (CD4)- ([facility] clinic only)   HIV-1 RNA quant-no  reflex-bld   CBC   Comprehensive metabolic panel   Lipid panel   RPR     Medium   Late latent syphilis    His RPR was declining at the time of his last blood work.  I will repeat it today.      Relevant Medications   bictegravir-emtricitabine-tenofovir AF (BIKTARVY) 50-200-25 MG TABS tablet     Unprioritized   Unintentional weight loss    His weight loss has  reversed with treatment of his HIV infection.      Psoriasis    His psoriasis has resolved with treatment of his HIV infection.      Polyarthralgia    I wonder if he may have psoriatic arthritis.           Michel Bickers, MD [facility] [phone] pager   [phone] cell 07/25/2020, 4:44 PM

## 2020-07-25 NOTE — Assessment & Plan Note (Signed)
His infection has come under much better long-term control since starting therapy 2 years ago.  He will get blood work today, continue Airline pilot and follow-up in 6 months.  I did encourage him to rethink his decision about getting the Covid vaccines.

## 2020-07-25 NOTE — Assessment & Plan Note (Signed)
His RPR was declining at the time of his last blood work.  I will repeat it today.

## 2020-07-26 LAB — T-HELPER CELL (CD4) - (RCID CLINIC ONLY)
CD4 % Helper T Cell: 12 % — ABNORMAL LOW (ref 33–65)
CD4 T Cell Abs: 199 /uL — ABNORMAL LOW (ref 400–1790)

## 2020-07-27 LAB — CBC
HCT: 38.3 % — ABNORMAL LOW (ref 38.5–50.0)
Hemoglobin: 12.4 g/dL — ABNORMAL LOW (ref 13.2–17.1)
MCH: 27.4 pg (ref 27.0–33.0)
MCHC: 32.4 g/dL (ref 32.0–36.0)
MCV: 84.5 fL (ref 80.0–100.0)
MPV: 9.3 fL (ref 7.5–12.5)
Platelets: 311 10*3/uL (ref 140–400)
RBC: 4.53 10*6/uL (ref 4.20–5.80)
RDW: 15 % (ref 11.0–15.0)
WBC: 5.4 10*3/uL (ref 3.8–10.8)

## 2020-07-27 LAB — COMPREHENSIVE METABOLIC PANEL
AG Ratio: 1.1 (calc) (ref 1.0–2.5)
ALT: 10 U/L (ref 9–46)
AST: 11 U/L (ref 10–40)
Albumin: 3.8 g/dL (ref 3.6–5.1)
Alkaline phosphatase (APISO): 96 U/L (ref 36–130)
BUN: 9 mg/dL (ref 7–25)
CO2: 27 mmol/L (ref 20–32)
Calcium: 8.8 mg/dL (ref 8.6–10.3)
Chloride: 102 mmol/L (ref 98–110)
Creat: 0.83 mg/dL (ref 0.60–1.35)
Globulin: 3.6 g/dL (calc) (ref 1.9–3.7)
Glucose, Bld: 118 mg/dL — ABNORMAL HIGH (ref 65–99)
Potassium: 4.2 mmol/L (ref 3.5–5.3)
Sodium: 137 mmol/L (ref 135–146)
Total Bilirubin: 0.3 mg/dL (ref 0.2–1.2)
Total Protein: 7.4 g/dL (ref 6.1–8.1)

## 2020-07-27 LAB — LIPID PANEL
Cholesterol: 127 mg/dL (ref <200)
HDL: 20 mg/dL — ABNORMAL LOW (ref >=40)
LDL Cholesterol (Calc): 72 mg/dL (calc)
Non-HDL Cholesterol (Calc): 107 mg/dL (calc) (ref <130)
Total CHOL/HDL Ratio: 6.4 (calc) — ABNORMAL HIGH (ref <5.0)
Triglycerides: 266 mg/dL — ABNORMAL HIGH (ref <150)

## 2020-07-27 LAB — RPR TITER: RPR Titer: 1:32 {titer} — ABNORMAL HIGH

## 2020-07-27 LAB — HIV-1 RNA QUANT-NO REFLEX-BLD
HIV 1 RNA Quant: <20 Copies/mL
HIV-1 RNA Quant, Log: <1.3 Log cps/mL

## 2020-07-27 LAB — RPR: RPR Ser Ql: REACTIVE — AB

## 2020-07-27 LAB — FLUORESCENT TREPONEMAL AB(FTA)-IGG-BLD: Fluorescent Treponemal ABS: REACTIVE — AB

## 2020-08-01 ENCOUNTER — Telehealth: Payer: Self-pay

## 2020-08-01 NOTE — Telephone Encounter (Signed)
Albert, [facility] called office to follow up on RPR titre from 8/24. Patient's RPR jumped from 1:16 in October to 1:32 this month. Will forward message to MD to advise if treatment is needed. [place]

## 2020-08-01 NOTE — Telephone Encounter (Signed)
Now I do not feel that he needs retreatment.  I believe that the persistently positive RPR reflects "serofast" status.

## 2020-09-13 ENCOUNTER — Other Ambulatory Visit: Payer: Self-pay | Admitting: Internal Medicine

## 2020-09-13 DIAGNOSIS — G629 Polyneuropathy, unspecified: Secondary | ICD-10-CM

## 2021-03-08 ENCOUNTER — Telehealth (INDEPENDENT_AMBULATORY_CARE_PROVIDER_SITE_OTHER): Payer: Medicare Other | Admitting: Internal Medicine

## 2021-03-08 DIAGNOSIS — B20 Human immunodeficiency virus [HIV] disease: Secondary | ICD-10-CM | POA: Diagnosis not present

## 2021-03-08 DIAGNOSIS — M130 Polyarthritis, unspecified: Secondary | ICD-10-CM

## 2021-03-08 MED ORDER — BIKTARVY 50-200-25 MG PO TABS: 1.0000 | ORAL_TABLET | Freq: Every day | ORAL | 11 refills | Status: DC

## 2021-03-08 NOTE — Progress Notes (Signed)
Virtual Visit via Telephone Note  I connected with Patrick Huber on 03/08/21 at 11:00 AM EDT by telephone and verified that I am speaking with the correct person using two identifiers.  Location: Patient: Home Provider: [facility]   I discussed the limitations, risks, security and privacy concerns of performing an evaluation and management service by telephone and the availability of in person appointments. I also discussed with the patient that there may be a patient responsible charge related to this service. The patient expressed understanding and agreed to proceed.   History of Present Illness: I called and spoke with Patrick Huber today.  He denies any problems obtaining, taking or tolerating his Biktarvy.  He recalls missing 1 dose last week when he got away from home forgot to take it but otherwise thinks that he rarely misses.  He is also taking gabapentin for neuropathy.  He is having more problems with right ankle and knee pain and swelling.  His sister tried to get him a follow-up appointment with [facility] orthopedics but they denied him.  He has not seen any other doctors in [place].  He states that he has not taken a Covid vaccine because 2 of his friends died from Covid after receiving the vaccine.  He has test kits at home and test himself frequently.  He is not having any problems with psoriasis.  His rash has cleared completely.   Observations/Objective: HIV 1 RNA Quant  Date Value  07/25/2020 <20 Copies/mL  09/02/2019 <20 NOT DETECTED copies/mL  06/23/2019 <20 NOT DETECTED copies/mL   CD4 T Cell Abs (/uL)  Date Value  07/25/2020 199 (L)  09/02/2019 139 (L)  06/23/2019 123 (L)   HIV 1 RNA Quant  Date Value  07/25/2020 <20 Copies/mL  09/02/2019 <20 NOT DETECTED copies/mL  06/23/2019 <20 NOT DETECTED copies/mL   CD4 T Cell Abs (/uL)  Date Value  07/25/2020 199 (L)  09/02/2019 139 (L)  06/23/2019 123 (L)    Assessment and Plan: His infection has been under excellent control  ever since he was diagnosed a few years ago when he is having slow but steady CD4 reconstitution.  He will continue Biktarvy and follow-up here for repeat blood work in 3 months.  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  I will see if we can arrange an orthopedic referral in [place] to evaluate his ongoing polyarthropathy.  Follow Up Instructions: Continue Biktarvy and gabapentin Follow-up here in 3 months Orthopedic referral   I discussed the assessment and treatment plan with the patient. The patient was provided an opportunity to ask questions and all were answered. The patient agreed with the plan and demonstrated an understanding of the instructions.   The patient was advised to call back or seek an in-person evaluation if the symptoms worsen or if the condition fails to improve as anticipated.  I provided 16 minutes of non-face-to-face time during this encounter.   Michel Bickers, MD

## 2021-04-04 ENCOUNTER — Other Ambulatory Visit: Payer: Self-pay | Admitting: Internal Medicine

## 2021-04-04 DIAGNOSIS — G629 Polyneuropathy, unspecified: Secondary | ICD-10-CM

## 2021-06-12 ENCOUNTER — Ambulatory Visit (INDEPENDENT_AMBULATORY_CARE_PROVIDER_SITE_OTHER): Payer: Medicare Other | Admitting: Internal Medicine

## 2021-06-12 ENCOUNTER — Other Ambulatory Visit: Payer: Self-pay

## 2021-06-12 ENCOUNTER — Encounter: Payer: Self-pay | Admitting: Internal Medicine

## 2021-06-12 DIAGNOSIS — G629 Polyneuropathy, unspecified: Secondary | ICD-10-CM | POA: Diagnosis not present

## 2021-06-12 DIAGNOSIS — A528 Late syphilis, latent: Secondary | ICD-10-CM | POA: Diagnosis not present

## 2021-06-12 DIAGNOSIS — L409 Psoriasis, unspecified: Secondary | ICD-10-CM | POA: Diagnosis not present

## 2021-06-12 DIAGNOSIS — B20 Human immunodeficiency virus [HIV] disease: Secondary | ICD-10-CM | POA: Diagnosis present

## 2021-06-12 DIAGNOSIS — M130 Polyarthritis, unspecified: Secondary | ICD-10-CM

## 2021-06-12 MED ORDER — BIKTARVY 50-200-25 MG PO TABS: 1.0000 | ORAL_TABLET | Freq: Every day | ORAL | 11 refills | Status: DC

## 2021-06-12 MED ORDER — GABAPENTIN 300 MG PO CAPS: ORAL_CAPSULE | ORAL | 11 refills | Status: DC

## 2021-06-12 NOTE — Assessment & Plan Note (Signed)
His infection has been under excellent control since starting therapy several years ago.  He will continue Biktarvy, get repeat lab work today and follow-up in 6 months.  I encouraged him to get a COVID-vaccine as soon as possible.

## 2021-06-12 NOTE — Assessment & Plan Note (Signed)
I will make a referral for rheumatology.

## 2021-06-12 NOTE — Progress Notes (Signed)
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    Priority: Medium   Anemia 11/09/2018   Elevated liver enzymes 11/09/2018   Neuropathy 10/07/2018   Bilateral lower extremity edema 09/08/2018   Malnutrition of moderate degree 08/18/2018   Unintentional weight loss 08/18/2018   Polyarthropathy 08/18/2018   Knee mass, right 08/18/2018   Pancytopenia ([place]) 08/18/2018   Cigarette smoker 08/18/2018   GERD (gastroesophageal reflux disease) 08/18/2018   Psoriasis 08/18/2018   History of MRSA infection 08/18/2018    Patient's Medications  New Prescriptions   No medications on file  Previous Medications   SULFAMETHOXAZOLE-TRIMETHOPRIM (BACTRIM DS) 800-160 MG TABLET    TAKE 1 TABLET BY MOUTH DAILY  Modified Medications   Modified Medication Previous Medication   BICTEGRAVIR-EMTRICITABINE-TENOFOVIR AF (BIKTARVY) 50-200-25 MG TABS TABLET bictegravir-emtricitabine-tenofovir AF (BIKTARVY) 50-200-25 MG TABS tablet      Take 1 tablet by mouth daily.    Take 1 tablet by mouth daily.   GABAPENTIN (NEURONTIN) 300 MG CAPSULE gabapentin (NEURONTIN) 300 MG capsule      TAKE 1 CAPSULE(300 MG) BY MOUTH THREE TIMES DAILY    TAKE 1 CAPSULE(300 MG) BY MOUTH THREE TIMES DAILY  Discontinued Medications   No medications on file    Subjective: Patrick Huber is in for his routine HIV follow-up visit he denies any problems obtaining, taking or tolerating his Biktarvy.  He denies missing any doses.  He reminds me that I told him to stop his trimethoprim/sulfamethoxazole at the time of his last visit when his CD4 count was 199.  He is also taking gabapentin with some relief of his pain.  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  He was able to be seen by an orthopedist in [place] but says that all they did was take an x-ray of his back and tell him that nothing was wrong.  I did  not really even examine his joints.  Review of Systems: Review of Systems  Constitutional:  Negative for fever, malaise/fatigue and weight loss.  Respiratory:  Negative for cough.   Cardiovascular:  Negative for chest pain.  Gastrointestinal:  Negative for abdominal pain, diarrhea, nausea and vomiting.  Musculoskeletal:  Positive for joint pain.  Skin:  Negative for rash.  Psychiatric/Behavioral:  Negative for depression.    Past Medical History:  Diagnosis Date   Angina pectoris ([place])    HIV (human immunodeficiency virus infection) ([place])     Social History   Tobacco Use   Smoking status: Every Day    Packs/day: 1.00    Years: 25.00    Pack years: 25.00    Types: Cigarettes   Smokeless tobacco: Never  Vaping Use   Vaping Use: Never used  Substance Use Topics   Alcohol use: Not Currently   Drug use: Never    No family history on file.  Allergies  Allergen Reactions   Meperidine Anaphylaxis    Health Maintenance  Topic Date Due   COVID-19 Vaccine (1) Never done   Pneumococcal Vaccine 72-72 Years old (1 - PCV) Never done   TETANUS/TDAP  Never done   COLONOSCOPY (Pts 45-71yrs Insurance coverage will need to be confirmed)  Never done   INFLUENZA VACCINE  07/02/2021   Hepatitis C Screening  Completed   HIV Screening  Completed   HPV VACCINES  Aged  Out    Objective:  Vitals:   06/12/21 1342  Weight: 212 lb (96.2 kg)  Height: 6\' 1"  (1.854 m)   Body mass index is 27.97 kg/m.  Physical Exam Constitutional:      Comments: He is in good spirits.  Cardiovascular:     Rate and Rhythm: Normal rate and regular rhythm.     Heart sounds: No murmur heard. Pulmonary:     Effort: Pulmonary effort is normal.     Breath sounds: Normal breath sounds.  Abdominal:     Palpations: Abdomen is soft.     Tenderness: There is no abdominal tenderness.  Musculoskeletal:        General: Swelling, tenderness and deformity present.     Comments: He has swelling of his elbows  with restricted range of motion.  He has some swelling of his right knee.  He has pain with range of motion of his right knee and both ankles.  Skin:    Findings: No rash.  Psychiatric:        Mood and Affect: Mood normal.    Lab Results Lab Results  Component Value Date   WBC 5.4 07/25/2020   HGB 12.4 (L) 07/25/2020   HCT 38.3 (L) 07/25/2020   MCV 84.5 07/25/2020   PLT 311 07/25/2020    Lab Results  Component Value Date   CREATININE 0.83 07/25/2020   BUN 9 07/25/2020   NA 137 07/25/2020   K 4.2 07/25/2020   CL 102 07/25/2020   CO2 27 07/25/2020    Lab Results  Component Value Date   ALT 10 07/25/2020   AST 11 07/25/2020   ALKPHOS 168 (H) 08/17/2018   BILITOT 0.3 07/25/2020    Lab Results  Component Value Date   CHOL 127 07/25/2020   HDL 20 (L) 07/25/2020   LDLCALC 72 07/25/2020   TRIG 266 (H) 07/25/2020   CHOLHDL 6.4 (H) 07/25/2020   Lab Results  Component Value Date   LABRPR REACTIVE (A) 07/25/2020   RPRTITER 1:32 (H) 07/25/2020   HIV 1 RNA Quant  Date Value  07/25/2020 <20 Copies/mL  09/02/2019 <20 NOT DETECTED copies/mL  06/23/2019 <20 NOT DETECTED copies/mL   CD4 T Cell Abs (/uL)  Date Value  07/25/2020 199 (L)  09/02/2019 139 (L)  06/23/2019 123 (L)     Problem List Items Addressed This Visit       High   HIV disease ([place])    His infection has been under excellent control since starting therapy several years ago.  He will continue Biktarvy, get repeat lab work today and follow-up in 6 months.  I encouraged him to get a COVID-vaccine as soon as possible.       Relevant Medications   bictegravir-emtricitabine-tenofovir AF (BIKTARVY) 50-200-25 MG TABS tablet   Other Relevant Orders   T-helper cell (CD4)- ([facility] clinic only)   HIV-1 RNA quant-no reflex-bld   CBC   Comprehensive metabolic panel   RPR     Medium   Late latent syphilis    His RPR has continued to cycle up and down since treatment for late latent syphilis several years ago.   I will repeat his RPR today.  He is aware that he may need retreatment soon.       Relevant Medications   bictegravir-emtricitabine-tenofovir AF (BIKTARVY) 50-200-25 MG TABS tablet     Unprioritized   Polyarthropathy    I will make a referral for rheumatology.       Relevant  Orders   Ambulatory referral to Rheumatology   Psoriasis    His psoriasis is in remission concurrent with better control of his HIV infection.       Neuropathy   Relevant Medications   gabapentin (NEURONTIN) 300 MG capsule      Michel Bickers, MD [facility] [place] [phone] pager   [phone] cell 06/12/2021, 1:59 PM

## 2021-06-12 NOTE — Assessment & Plan Note (Signed)
His psoriasis is in remission concurrent with better control of his HIV infection.

## 2021-06-12 NOTE — Assessment & Plan Note (Signed)
His RPR has continued to cycle up and down since treatment for late latent syphilis several years ago.  I will repeat his RPR today.  He is aware that he may need retreatment soon.

## 2021-06-13 ENCOUNTER — Telehealth: Payer: Self-pay

## 2021-06-13 LAB — T-HELPER CELL (CD4) - (RCID CLINIC ONLY)
CD4 % Helper T Cell: 13 % — ABNORMAL LOW (ref 33–65)
CD4 T Cell Abs: 241 /uL — ABNORMAL LOW (ref 400–1790)

## 2021-06-13 NOTE — Telephone Encounter (Signed)
Patient informed of results and scheduled for 2.4 million units bicillin x3 weeks per Dr. Megan Salon. First dose 7/15.   Emin Foree Lorita Officer, RN

## 2021-06-13 NOTE — Telephone Encounter (Signed)
-----   Message from Michel Bickers, MD sent at 06/13/2021  3:10 PM EDT ----- [name]'s RPR remains elevated at 1:32 despite treatment for late latent syphilis several years ago.  He will need retreatment for latent syphilis with benzathine penicillin 2,400,000 units IM weekly x3 doses.  I told him that this was a possibility during his visit earlier this week.  Thanks.  Patrick Huber ----- Message ----- From: Buel Ream, [facility] Lab Results In Sent: 06/12/2021  10:34 PM EDT To: Michel Bickers, MD

## 2021-06-14 LAB — CBC
HCT: 40.2 % (ref 38.5–50.0)
Hemoglobin: 13.1 g/dL — ABNORMAL LOW (ref 13.2–17.1)
MCH: 28.4 pg (ref 27.0–33.0)
MCHC: 32.6 g/dL (ref 32.0–36.0)
MCV: 87 fL (ref 80.0–100.0)
MPV: 9.1 fL (ref 7.5–12.5)
Platelets: 284 10*3/uL (ref 140–400)
RBC: 4.62 10*6/uL (ref 4.20–5.80)
RDW: 15.8 % — ABNORMAL HIGH (ref 11.0–15.0)
WBC: 6.4 10*3/uL (ref 3.8–10.8)

## 2021-06-14 LAB — HIV-1 RNA QUANT-NO REFLEX-BLD
HIV 1 RNA Quant: <20 Copies/mL — ABNORMAL HIGH
HIV-1 RNA Quant, Log: <1.3 Log cps/mL — ABNORMAL HIGH

## 2021-06-14 LAB — RPR TITER: RPR Titer: 1:32 {titer} — ABNORMAL HIGH

## 2021-06-14 LAB — COMPREHENSIVE METABOLIC PANEL
AG Ratio: 1 (calc) (ref 1.0–2.5)
ALT: 7 U/L — ABNORMAL LOW (ref 9–46)
AST: 8 U/L — ABNORMAL LOW (ref 10–40)
Albumin: 3.9 g/dL (ref 3.6–5.1)
Alkaline phosphatase (APISO): 113 U/L (ref 36–130)
BUN: 7 mg/dL (ref 7–25)
CO2: 29 mmol/L (ref 20–32)
Calcium: 8.8 mg/dL (ref 8.6–10.3)
Chloride: 101 mmol/L (ref 98–110)
Creat: 0.89 mg/dL (ref 0.60–1.29)
Globulin: 4.1 g/dL (calc) — ABNORMAL HIGH (ref 1.9–3.7)
Glucose, Bld: 128 mg/dL — ABNORMAL HIGH (ref 65–99)
Potassium: 3.8 mmol/L (ref 3.5–5.3)
Sodium: 137 mmol/L (ref 135–146)
Total Bilirubin: 0.3 mg/dL (ref 0.2–1.2)
Total Protein: 8 g/dL (ref 6.1–8.1)

## 2021-06-14 LAB — RPR: RPR Ser Ql: REACTIVE — AB

## 2021-06-14 LAB — FLUORESCENT TREPONEMAL AB(FTA)-IGG-BLD: Fluorescent Treponemal ABS: REACTIVE — AB

## 2021-06-15 ENCOUNTER — Ambulatory Visit: Payer: Medicare Other

## 2021-06-22 ENCOUNTER — Ambulatory Visit: Payer: Medicare Other

## 2021-06-29 ENCOUNTER — Telehealth: Payer: Self-pay

## 2021-06-29 ENCOUNTER — Ambulatory Visit: Payer: Medicare Other

## 2021-06-29 NOTE — Telephone Encounter (Signed)
Attempted to call patient regarding missed appointment for syphillis treatment. Left voicemail requesting patient call office back. Did not disclose reason for call. Patient has missed 3 nurse visits for bicillin. Will refer patient to [facility] for treatment. Leatrice Jewels, RMA

## 2021-07-20 NOTE — Progress Notes (Signed)
Office Visit Note  Patient: Patrick Huber             Date of Birth: 1971/01/05           MRN: [id number]             PCP: Michel Bickers, MD Referring: Michel Bickers, MD Visit Date: 07/30/2021 Occupation: '@GUAROCC'$ @  Subjective:  Pain and swelling in multiple joints   History of Present Illness: Patrick Huber is a 50 y.o. male seen in consultation per request of Dr. Megan Salon.  According the patient in 2019 he developed severe weight loss and difficulty walking.  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  He was treated for it and gradually recovered.  He states he was in rehab for a long time and gradually was able to walk.  He states about 6 months ago he developed pain and swelling in his right knee and right ankle joint to the point he has been having difficulty walking.  He has occasional discomfort in his left knee joint.  He states the swelling has been worse in the last 2 months.  He was seen by sports medicine at [facility] where x-ray of the lumbar spine was performed which was unremarkable.  He states he does not have any lower back pain.  He denies any history of psoriasis.  He is evaluated by Dr. Megan Salon every 3 months.  His recent counts were okay.  He states he had a rash when he was in the hospital in his scalp which resolved by itself.  He has never seen a dermatologist.  There is no family history of autoimmune disease.  He does not recall having MRSA infection.  He has 2 healthy children.  Activities of Daily Living:  Patient reports morning stiffness for all day. Patient Reports nocturnal pain.  Difficulty dressing/grooming: Reports Difficulty climbing stairs: Reports Difficulty getting out of chair: Reports Difficulty using hands for taps, buttons, cutlery, and/or writing: Denies  Review of Systems  Constitutional:  Negative for fatigue.  HENT:  Negative for mouth sores, mouth dryness and nose dryness.   Eyes:  Negative for pain, itching and  dryness.  Respiratory:  Negative for shortness of breath and difficulty breathing.   Cardiovascular:  Negative for chest pain and palpitations.  Gastrointestinal:  Negative for blood in stool, constipation and diarrhea.  Endocrine: Negative for increased urination.  Genitourinary:  Negative for difficulty urinating.  Musculoskeletal:  Positive for joint pain, joint pain, joint swelling and morning stiffness. Negative for myalgias, muscle tenderness and myalgias.  Skin:  Negative for color change, rash and redness.  Allergic/Immunologic: Negative for susceptible to infections.  Neurological:  Positive for numbness. Negative for dizziness, headaches, memory loss and weakness.  Hematological:  Negative for bruising/bleeding tendency.  Psychiatric/Behavioral:  Positive for sleep disturbance. Negative for confusion.    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   Knee mass, right 08/18/2018   Pancytopenia ([place]) 08/18/2018   Cigarette smoker 08/18/2018   GERD (gastroesophageal reflux disease) 08/18/2018   Psoriasis 08/18/2018   History of MRSA infection 08/18/2018    Past Medical History:  Diagnosis Date   Angina pectoris (  [place])    HIV (human immunodeficiency virus infection) ([place])     Family History  Problem Relation Age of Onset   Heart disease Mother    Cancer Mother    Cancer Father    Heart disease Father    Cancer Sister    Heart disease Brother    Healthy Son    Healthy Daughter    Past Surgical History:  Procedure Laterality Date   KNEE ARTHROSCOPY Left    Right knee surgery     [facility]   Social History   Social History Narrative    Not on file   Immunization History  Administered Date(s) Administered   Influenza,inj,Quad PF,6+ Mos 09/02/2019     Objective: Vital Signs: BP 138/70 (BP Location: Left Arm, Patient Position: Sitting, Cuff Size: Normal)   Pulse (!) 101   Ht '6\' 1"'$  (1.854 m)   Wt 213 lb (96.6 kg)   BMI 28.10 kg/m    Physical Exam Vitals and nursing note reviewed.  Constitutional:      Appearance: He is well-developed.  HENT:     Head: Normocephalic and atraumatic.  Eyes:     Conjunctiva/sclera: Conjunctivae normal.     Pupils: Pupils are equal, round, and reactive to light.  Cardiovascular:     Rate and Rhythm: Normal rate and regular rhythm.     Heart sounds: Normal heart sounds.  Pulmonary:     Effort: Pulmonary effort is normal.     Breath sounds: Normal breath sounds.  Abdominal:     General: Bowel sounds are normal.     Palpations: Abdomen is soft.  Musculoskeletal:     Cervical back: Normal range of motion and neck supple.  Skin:    General: Skin is warm and dry.     Capillary Refill: Capillary refill takes less than 2 seconds.  Neurological:     Mental Status: He is alert and oriented to person, place, and time.  Psychiatric:        Behavior: Behavior normal.     Musculoskeletal Exam: C-spine was in good range of motion.  It is difficult to assess lumbar spine range of motion in the sitting position.  He had no SI joint tenderness.  Shoulder joints with good range of motion.  He had bilateral elbow joint contractures and synovial thickening and synovitis.  Wrist joints MCPs, PIPs and DIPs with good range of motion with no synovitis.  He had discomfort range of motion of his left hip joint.  Bilateral knee joints painful range of motion with warmth and swelling.  No effusion was noted.  Right ankle joint was swollen.  He had no tenderness over left ankle joint.  There was no tenderness over MTPs or PIPs or DIPs.    CDAI Exam: CDAI Score: 9.6  Patient Global: 8 mm; Provider Global:  8 mm Swollen: 5 ; Tender: 5  Joint Exam 07/30/2021      Right  Left  Elbow  Swollen Tender  Swollen Tender  Knee  Swollen Tender  Swollen Tender  Ankle  Swollen Tender        Investigation: No additional findings.  Imaging: XR Elbow 2 Views Left  Result Date: 07/30/2021 Severe humeral ulnar joint space narrowing was noted.  No erosive changes were noted. 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   XR HIP UNILAT W OR W/O PELVIS 2-3 VIEWS LEFT  Result Date: 07/30/2021 No hip joint narrowing was noted.  Bilateral SI joint sclerosis was noted without any joint space narrowing or erosive changes. Impression: Bilateral SI joint sclerosis noted which could be consistent with osteoarthritis versus inflammatory arthritis.  XR Ankle 2 Views Right  Result Date: 07/30/2021 Soft tissue swelling was noted.  No tibiotalar or subtalar joint space narrowing was noted.  Posterior calcaneal spur was noted. Impression: No acute findings were noted.  Soft tissue swelling was noted.  XR Elbow 2 Views Right  Result Date: 07/30/2021 Severe humeroulnar joint space narrowing was noted.  No erosive changes were noted. 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  XR Foot 2 Views Left  Result Date: 07/30/2021 First MTP, PIP and DIP narrowing was noted.  No intertarsal, tibiotalar or subtalar joint space narrowing was noted.  Posterior calcaneal spur was noted. Impression: These findings are consistent with osteoarthritis of the foot.    XR Foot 2 Views Right  Result Date: 07/30/2021 First MTP, PIP and DIP narrowing was noted.  No intertarsal, tibiotalar or subtalar joint space narrowing was noted.  Posterior calcaneal spur was noted.  No erosive changes were noted.  Soft tissue swelling was noted. Impression: These findings are consistent with osteoarthritis of the foot.  XR Hand 2 View Left  Result Date:  07/30/2021 CMC, PIP and DIP narrowing was noted.  No MCP, intercarpal or radiocarpal joint space narrowing was noted.  No erosive changes were noted. Impression: These findings are consistent with osteoarthritis of the hand.  XR Hand 2 View Right  Result Date: 07/30/2021 CMC, PIP and DIP narrowing was noted. First MCP narrowing was noted. No other MCP, intercarpal or radiocarpal joint space narrowing was noted.  No erosive changes were noted.  Impression: These findings are consistent with osteoarthritis of the hand.  XR KNEE 3 VIEW LEFT  Result Date: 07/30/2021 Mild medial compartment narrowing was noted.  Mild patellofemoral narrowing was noted.  No chondrocalcinosis was noted. Impression: These findings are consistent with mild osteoarthritis and mild chondromalacia patella.  XR KNEE 3 VIEW RIGHT  Result Date: 07/30/2021 Mild medial compartment narrowing was noted.  Mild patellofemoral narrowing was noted.  No chondrocalcinosis was noted. Impression: These findings are consistent with mild osteoarthritis and mild chondromalacia patella.   Recent Labs: Lab Results  Component Value Date   WBC 6.4 06/12/2021   HGB 13.1 (L) 06/12/2021   PLT 284 06/12/2021   NA 137 06/12/2021   K 3.8 06/12/2021   CL 101 06/12/2021   CO2 29 06/12/2021   GLUCOSE 128 (H) 06/12/2021   BUN 7 06/12/2021   CREATININE 0.89 06/12/2021   BILITOT 0.3 06/12/2021   ALKPHOS 168 (H) 08/17/2018   AST 8 (L) 06/12/2021   ALT 7 (L) 06/12/2021   PROT 8.0 06/12/2021   ALBUMIN 2.2 (L) 08/17/2018   CALCIUM 8.8 06/12/2021   GFRAA 129 12/28/2018   QFTBGOLDPLUS NEGATIVE 12/28/2018    Speciality Comments: No specialty comments available.  Procedures:  No procedures performed Allergies: Meperidine   Assessment / Plan:     Visit Diagnoses: Polyarthropathy  Contracture of joint of both elbows-patient is contracture in his bilateral elbows with synovitis and synovial thickening.  He states he has no discomfort and the  symptoms started about 3 years ago.Plan: XR Elbow 2 Views Right, XR Elbow 2 Views Left.  X-rays were consistent with severe humeral ulnar joint narrowing consistent with inflammatory arthritis.  Pain in both hands -he complains of some stiffness in his hands.  No synovitis was noted.  Plan: XR Hand 2 View Right, XR Hand 2 View Left.  X-rays were consistent with osteoarthritis.  No erosive changes were noted.  Pain in left hip -he had discomfort range of motion of his left hip.  Plan: XR HIP UNILAT W OR W/O PELVIS 2-3 VIEWS LEFT.  Mild bilateral SI joint sclerosis was noted.  No SI joint narrowing was noted.  This can be consistent with osteoarthritis.  Chronic pain of both knees -he complains of pain and discomfort in his bilateral knee joints for at least 6 months.  He had warmth and swelling in his bilateral knee joints.  No discomfort in his right knee joint.  Plan: XR KNEE 3 VIEW RIGHT, XR KNEE 3 VIEW LEFT, x-ray showed mild osteoarthritis and mild chondromalacia patella.  Sedimentation rate, Uric acid, Rheumatoid factor, Cyclic citrul peptide antibody, IgG, Angiotensin converting enzyme.  I want to start him on prednisone taper but would like clearance from Dr. Megan Salon before starting it.  I could not reach Dr. Megan Salon today.  I will try to reach him again tomorrow.  Future treatment will be based on the lab work and serology.  Chronic pain of both ankles -he complains of pain and swelling in his ankle joints intermittently.  He had right knee joint severe pain and swelling.  Plan: XR Ankle 2 Views Right.  X-rays were unremarkable except for soft tissue swelling.  Pain in both feet -he has stiffness in his feet.  No synovitis was noted.  Plan: XR Foot 2 Views Right, XR Foot 2 Views Left.  X-rays were consistent with osteoarthritis.   High risk medication use -in anticipation to start him on future therapy I will obtain following labs today.  Plan: Hepatitis B surface antigen, Hepatitis C  antibody, QuantiFERON-TB Gold Plus, Serum protein electrophoresis with reflex, IgG, IgA, IgM, Glucose 6 phosphate dehydrogenase, Thiopurine methyltransferase(tpmt)rbc, Hepatitis B core antibody, IgM  Other fatigue - Plan: CK  Neuropathy  Psoriasis-psoriasis mentioned in his chart.  I did not see any psoriasis lesions.  Patient does not recall having psoriasis in the past.  Pancytopenia ([place]) - 2019- resolved.  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  HIV disease ([facility])-treated by Dr. Megan Salon.  History of MRSA infection-patient does not recall MRSA infection.  It is mentioned in his chart.  Cigarette smoker - 1PPDx 25 yeras    Orders: Orders Placed This Encounter  Procedures   XR KNEE 3 VIEW RIGHT   XR KNEE 3 VIEW LEFT   XR Ankle 2 Views Right   XR Elbow 2 Views Right   XR Elbow 2 Views Left   XR Hand 2 View Right   XR Hand 2 View Left   XR Foot 2 Views Right   XR Foot 2 Views Left   XR HIP UNILAT W OR W/O PELVIS 2-3 VIEWS LEFT   Sedimentation rate   CK   Uric acid   Rheumatoid factor   Cyclic citrul peptide antibody, IgG   Angiotensin converting enzyme   Hepatitis B surface antigen   Hepatitis C antibody   QuantiFERON-TB Gold Plus   Serum protein electrophoresis with reflex   IgG, IgA, IgM   Glucose 6 phosphate dehydrogenase   Thiopurine methyltransferase(tpmt)rbc   Hepatitis B core antibody, IgM    No orders of the defined types were placed in this encounter.    Follow-Up Instructions: Return for Polyarthritis.   Bo Merino, MD  Note - This record has been created using Editor, commissioning.  Chart creation errors  have been sought, but may not always  have been located. Such creation errors do not reflect on  the standard of medical care.

## 2021-07-30 ENCOUNTER — Ambulatory Visit: Payer: Self-pay

## 2021-07-30 ENCOUNTER — Other Ambulatory Visit: Payer: Self-pay | Admitting: Rheumatology

## 2021-07-30 ENCOUNTER — Other Ambulatory Visit: Payer: Self-pay

## 2021-07-30 ENCOUNTER — Encounter: Payer: Self-pay | Admitting: Rheumatology

## 2021-07-30 ENCOUNTER — Ambulatory Visit (INDEPENDENT_AMBULATORY_CARE_PROVIDER_SITE_OTHER): Payer: Medicare Other | Admitting: Rheumatology

## 2021-07-30 VITALS — BP 138/70 | HR 101 | Ht 73.0 in | Wt 213.0 lb

## 2021-07-30 DIAGNOSIS — M130 Polyarthritis, unspecified: Secondary | ICD-10-CM

## 2021-07-30 DIAGNOSIS — F17210 Nicotine dependence, cigarettes, uncomplicated: Secondary | ICD-10-CM

## 2021-07-30 DIAGNOSIS — L409 Psoriasis, unspecified: Secondary | ICD-10-CM

## 2021-07-30 DIAGNOSIS — M25562 Pain in left knee: Secondary | ICD-10-CM | POA: Diagnosis not present

## 2021-07-30 DIAGNOSIS — M79642 Pain in left hand: Secondary | ICD-10-CM | POA: Diagnosis not present

## 2021-07-30 DIAGNOSIS — M25552 Pain in left hip: Secondary | ICD-10-CM | POA: Diagnosis not present

## 2021-07-30 DIAGNOSIS — M24522 Contracture, left elbow: Secondary | ICD-10-CM

## 2021-07-30 DIAGNOSIS — R5383 Other fatigue: Secondary | ICD-10-CM

## 2021-07-30 DIAGNOSIS — M79672 Pain in left foot: Secondary | ICD-10-CM

## 2021-07-30 DIAGNOSIS — Z8614 Personal history of Methicillin resistant Staphylococcus aureus infection: Secondary | ICD-10-CM

## 2021-07-30 DIAGNOSIS — M25571 Pain in right ankle and joints of right foot: Secondary | ICD-10-CM

## 2021-07-30 DIAGNOSIS — D61818 Other pancytopenia: Secondary | ICD-10-CM

## 2021-07-30 DIAGNOSIS — G8929 Other chronic pain: Secondary | ICD-10-CM

## 2021-07-30 DIAGNOSIS — M79641 Pain in right hand: Secondary | ICD-10-CM

## 2021-07-30 DIAGNOSIS — M25572 Pain in left ankle and joints of left foot: Secondary | ICD-10-CM

## 2021-07-30 DIAGNOSIS — M24521 Contracture, right elbow: Secondary | ICD-10-CM | POA: Diagnosis not present

## 2021-07-30 DIAGNOSIS — M79671 Pain in right foot: Secondary | ICD-10-CM | POA: Diagnosis not present

## 2021-07-30 DIAGNOSIS — R748 Abnormal levels of other serum enzymes: Secondary | ICD-10-CM

## 2021-07-30 DIAGNOSIS — M25561 Pain in right knee: Secondary | ICD-10-CM

## 2021-07-30 DIAGNOSIS — B20 Human immunodeficiency virus [HIV] disease: Secondary | ICD-10-CM

## 2021-07-30 DIAGNOSIS — E440 Moderate protein-calorie malnutrition: Secondary | ICD-10-CM

## 2021-07-30 DIAGNOSIS — R634 Abnormal weight loss: Secondary | ICD-10-CM

## 2021-07-30 DIAGNOSIS — Z79899 Other long term (current) drug therapy: Secondary | ICD-10-CM

## 2021-07-30 DIAGNOSIS — A528 Late syphilis, latent: Secondary | ICD-10-CM

## 2021-07-30 DIAGNOSIS — G629 Polyneuropathy, unspecified: Secondary | ICD-10-CM

## 2021-07-31 ENCOUNTER — Telehealth: Payer: Self-pay | Admitting: Rheumatology

## 2021-07-31 ENCOUNTER — Telehealth: Payer: Self-pay | Admitting: Pharmacist

## 2021-07-31 ENCOUNTER — Telehealth: Payer: Self-pay | Admitting: Internal Medicine

## 2021-07-31 DIAGNOSIS — L409 Psoriasis, unspecified: Secondary | ICD-10-CM

## 2021-07-31 DIAGNOSIS — M130 Polyarthritis, unspecified: Secondary | ICD-10-CM

## 2021-07-31 MED ORDER — PREDNISONE 10 MG PO TABS: ORAL_TABLET | ORAL | 0 refills | Status: DC

## 2021-07-31 NOTE — Telephone Encounter (Signed)
Spoke with patient and he has been scheduled for 08/07/2021 at 9:40 am. Patient states he prefers his prescription to be sent to the [facility]. Prescription pended for Prednisone in previous phone not and sent to Dr. Estanislado Pandy for review and to be sent to the pharmacy.

## 2021-07-31 NOTE — Telephone Encounter (Signed)
Spoke with patient and he has been scheduled for 08/07/2021 at 9:40 am. Patient states he prefers his prescription to be sent to the [place].

## 2021-07-31 NOTE — Telephone Encounter (Signed)
I spoke with Dr. Bo Merino today.  She evaluated Patrick Huber yesterday for his chronic polyarthritis.  I let her know that his HIV, latent syphilis and history of MRSA are all in remission.  I felt that it was okay for her to put him on prednisone and even TNF alpha inhibitors if she feels that he might benefit.

## 2021-07-31 NOTE — Telephone Encounter (Signed)
Dr. Estanislado Pandy reviewed patient's ID history with Dr. Megan Salon at [facility]. Dr. Megan Salon is comfortable with patient starting immunosuppressive therapies for psoriasis including TNF-inhibitors .  For now the plan is to start prednisone taper as follows: Prednisone '20mg'$  daily x 1 week, then '15mg'$  daily x 1 week, then 10 mg daily x 1 week, then 5 mg daily x 1 week.  After review with Dr. Estanislado Pandy, we will try to bring patient in sooner to make decision about medication new start for his psoriasis. No medications were discussed at [place] and he will need consent at that visit  Current appointment is scheduled for 08/29/21.   Knox Saliva, PharmD, MPH, BCPS Clinical Pharmacist (Rheumatology and Pulmonology)

## 2021-07-31 NOTE — Telephone Encounter (Signed)
I received a phone call back from Dr. Megan Salon today.  He stated that Patrick Huber is in remission as regards to HIV, latent syphilis and MRSA.  Dr. Megan Salon had no hesitation to start patient on prednisone or Biologics including anti-TNF's based on his previous history of infections.  I plan to start him on prednisone 20 mg p.o. daily and taper by 5 mg every week for suppression therapy.  We will also schedule an earlier appointment to start him on Enbrel 50 mg subcu weekly due to aggressive nature of his disease.  Dr. Megan Salon also confirmed that patient had extensive psoriasis prior to starting on HIV treatment. Bo Merino, MD

## 2021-08-03 LAB — RHEUMATOID FACTOR: Rheumatoid fact SerPl-aCnc: 20 IU/mL — ABNORMAL HIGH (ref <14)

## 2021-08-03 LAB — URIC ACID: Uric Acid, Serum: 5 mg/dL (ref 4.0–8.0)

## 2021-08-03 LAB — QUANTIFERON-TB GOLD PLUS
Mitogen-NIL: >10 IU/mL
NIL: 0.03 IU/mL
QuantiFERON-TB Gold Plus: NEGATIVE
TB1-NIL: 0 IU/mL
TB2-NIL: 0 IU/mL

## 2021-08-03 LAB — HEPATITIS B SURFACE ANTIGEN: Hepatitis B Surface Ag: NONREACTIVE

## 2021-08-03 LAB — HEPATITIS C ANTIBODY
Hepatitis C Ab: NONREACTIVE
SIGNAL TO CUT-OFF: 0.08 (ref <1.00)

## 2021-08-03 LAB — GLUCOSE 6 PHOSPHATE DEHYDROGENASE: G-6PDH: 18.5 U/g Hgb (ref 7.0–20.5)

## 2021-08-03 LAB — PROTEIN ELECTROPHORESIS, SERUM, WITH REFLEX
Albumin ELP: 4 g/dL (ref 3.8–4.8)
Alpha 1: 0.4 g/dL — ABNORMAL HIGH (ref 0.2–0.3)
Alpha 2: 0.8 g/dL (ref 0.5–0.9)
Beta 2: 0.4 g/dL (ref 0.2–0.5)
Beta Globulin: 0.4 g/dL (ref 0.4–0.6)
Gamma Globulin: 2 g/dL — ABNORMAL HIGH (ref 0.8–1.7)
Total Protein: 8.1 g/dL (ref 6.1–8.1)

## 2021-08-03 LAB — ANGIOTENSIN CONVERTING ENZYME: Angiotensin-Converting Enzyme: 10 U/L (ref 9–67)

## 2021-08-03 LAB — THIOPURINE METHYLTRANSFERASE (TPMT), RBC: Thiopurine Methyltransferase, RBC: 18 nmol/hr/mL RBC

## 2021-08-03 LAB — IGG, IGA, IGM
IgG (Immunoglobin G), Serum: 2381 mg/dL — ABNORMAL HIGH (ref 600–1640)
IgM, Serum: 114 mg/dL (ref 50–300)
Immunoglobulin A: 290 mg/dL (ref 47–310)

## 2021-08-03 LAB — HEPATITIS B CORE ANTIBODY, IGM: Hep B C IgM: NONREACTIVE

## 2021-08-03 LAB — CYCLIC CITRUL PEPTIDE ANTIBODY, IGG: Cyclic Citrullin Peptide Ab: >250 UNITS — ABNORMAL HIGH

## 2021-08-03 LAB — CK: Total CK: 43 U/L — ABNORMAL LOW (ref 44–196)

## 2021-08-03 LAB — SEDIMENTATION RATE: Sed Rate: 72 mm/h — ABNORMAL HIGH (ref 0–15)

## 2021-08-06 NOTE — Progress Notes (Signed)
Office Visit Note  Patient: Patrick Huber             Date of Birth: 03/26/1971           MRN: [id number]             PCP: Michel Bickers, MD Referring: Michel Bickers, MD Visit Date: 08/07/2021 Occupation: @GUAROCC @  Subjective:  Discuss starting enbrel   History of Present Illness: Patrick Huber is a 50 y.o. male with history of osteoarthritis and psoriasis.  He presents today to review x-rays and lab results.  He is currently taking a prednisone taper prescribed by Dr. Estanislado Pandy at his initial visit on 07/30/21.  He has persistent pain in both elbows, both knee joints, and the right ankle joint. He has ongoing swelling in his knees and right ankle.  He has noticed some improvement since starting on prednisone.  He denies any achilles tendonitis or plantar fasciitis.  He has some left SI joint tenderness.  He denies any psoriasis at this time.  He denies any eye pain or inflammation.  He denies any constipation, diarrhea, or blood in the stool.    Activities of Daily Living:  Patient reports morning stiffness for all day. Patient Reports nocturnal pain.  Difficulty dressing/grooming: Reports Difficulty climbing stairs: Reports Difficulty getting out of chair: Reports Difficulty using hands for taps, buttons, cutlery, and/or writing: Denies  Review of Systems  Constitutional:  Positive for fatigue.  HENT:  Negative for mouth sores, mouth dryness and nose dryness.   Eyes:  Negative for pain, itching and dryness.  Respiratory:  Negative for shortness of breath and difficulty breathing.   Cardiovascular:  Negative for chest pain and palpitations.  Gastrointestinal:  Negative for blood in stool, constipation and diarrhea.  Endocrine: Negative for increased urination.  Genitourinary:  Negative for difficulty urinating.  Musculoskeletal:  Positive for joint pain, joint pain, joint swelling and morning stiffness. Negative for myalgias, muscle tenderness and myalgias.  Skin:   Negative for color change, rash and redness.  Allergic/Immunologic: Negative for susceptible to infections.  Neurological:  Negative for dizziness, numbness, headaches, memory loss and weakness.  Hematological:  Negative for bruising/bleeding tendency.  Psychiatric/Behavioral:  Negative for confusion.    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)    HIV (human immunodeficiency virus infection) ([place])     Family History  Problem Relation Age of Onset   Heart disease Mother    Cancer Mother    Cancer Father    Heart disease Father    Cancer Sister    Heart disease Brother    Healthy Son    Healthy Daughter    Past Surgical History:  Procedure Laterality Date   KNEE ARTHROSCOPY Left    Right knee surgery     [facility] - at [facility]   Social History   Social History Narrative   Not on file  Immunization History  Administered Date(s) Administered   Influenza,inj,Quad PF,6+ Mos 09/02/2019     Objective: Vital Signs: BP (!) 145/95 (BP Location: Right Arm, Patient Position: Sitting, Cuff Size: Normal)   Pulse 83   Ht 6' 1"  (1.854 m)   Wt 210 lb (95.3 kg)   BMI 27.71 kg/m    Physical Exam Vitals and nursing note reviewed.  Constitutional:      Appearance: He is well-developed.  HENT:     Head: Normocephalic and atraumatic.  Eyes:     Conjunctiva/sclera: Conjunctivae normal.      Pupils: Pupils are equal, round, and reactive to light.  Pulmonary:     Effort: Pulmonary effort is normal.  Abdominal:     Palpations: Abdomen is soft.  Musculoskeletal:     Cervical back: Normal range of motion and neck supple.  Skin:    General: Skin is warm and dry.     Capillary Refill: Capillary refill takes less than 2 seconds.  Neurological:     Mental Status: He is alert and oriented to person, place, and time.  Psychiatric:        Behavior: Behavior normal.     Musculoskeletal Exam: C-spine, thoracic spine, and lumbar spine good ROM.  Tenderness over the left SI joint.  No midline spinal tenderness.  Shoulder joints have good ROM.  Bilateral elbow joint flexion contractures. Synovitis of both elbow joints.  Wrist joints have good ROM with no tenderness or joint swelling.  No tenderness or synovitis over MCP joints.  Complete fist formation bilaterally.  Hip joints have good ROM.  Painful ROM of both knee joints.  Tenderness and synovitis of the right ankle joint.     CDAI Exam: CDAI Score: -- Patient Global: --; Provider Global: -- Swollen: 0 ; Tender: 0  Joint Exam 08/07/2021   No joint exam has been documented for this visit   There is currently no information documented on the homunculus. Go to the Rheumatology activity and complete the homunculus joint exam.  Investigation: No additional findings.  Imaging: XR Elbow 2 Views Left  Result Date: 07/30/2021 Severe humeral ulnar joint space narrowing was noted.  No erosive changes were noted. 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   XR HIP UNILAT W OR W/O PELVIS 2-3 VIEWS LEFT  Result Date: 07/30/2021 No hip joint narrowing was noted.  Bilateral SI joint sclerosis was noted without any joint space narrowing or erosive changes. Impression: Bilateral SI joint sclerosis noted which could be consistent with osteoarthritis versus inflammatory arthritis.  XR Ankle 2 Views  Right  Result Date: 07/30/2021 Soft tissue swelling was noted.  No tibiotalar or subtalar joint space narrowing was noted.  Posterior calcaneal spur was noted. Impression: No acute findings were noted.  Soft tissue swelling was noted.  XR Elbow 2 Views Right  Result Date: 07/30/2021 Severe humeroulnar joint space narrowing was noted.  No erosive changes were noted. 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  XR Foot 2 Views Left  Result Date: 07/30/2021 First MTP, PIP and DIP narrowing was noted.  No intertarsal, tibiotalar or subtalar joint space narrowing was noted.  Posterior calcaneal spur was noted. Impression: These findings are consistent with osteoarthritis of the foot.    XR Foot 2 Views Right  Result Date: 07/30/2021 First MTP, PIP and DIP narrowing was noted.  No intertarsal, tibiotalar or subtalar joint space narrowing was noted.  Posterior calcaneal spur was  noted.  No erosive changes were noted.  Soft tissue swelling was noted. Impression: These findings are consistent with osteoarthritis of the foot.  XR Hand 2 View Left  Result Date: 07/30/2021 CMC, PIP and DIP narrowing was noted.  No MCP, intercarpal or radiocarpal joint space narrowing was noted.  No erosive changes were noted. Impression: These findings are consistent with osteoarthritis of the hand.  XR Hand 2 View Right  Result Date: 07/30/2021 CMC, PIP and DIP narrowing was noted. First MCP narrowing was noted. No other MCP, intercarpal or radiocarpal joint space narrowing was noted.  No erosive changes were noted.  Impression: These findings are consistent with osteoarthritis of the hand.  XR KNEE 3 VIEW LEFT  Result Date: 07/30/2021 Mild medial compartment narrowing was noted.  Mild patellofemoral narrowing was noted.  No chondrocalcinosis was noted. Impression: These findings are consistent with mild osteoarthritis and mild chondromalacia patella.  XR KNEE 3 VIEW  RIGHT  Result Date: 07/30/2021 Mild medial compartment narrowing was noted.  Mild patellofemoral narrowing was noted.  No chondrocalcinosis was noted. Impression: These findings are consistent with mild osteoarthritis and mild chondromalacia patella.   Recent Labs: Lab Results  Component Value Date   WBC 6.4 06/12/2021   HGB 13.1 (L) 06/12/2021   PLT 284 06/12/2021   NA 137 06/12/2021   K 3.8 06/12/2021   CL 101 06/12/2021   CO2 29 06/12/2021   GLUCOSE 128 (H) 06/12/2021   BUN 7 06/12/2021   CREATININE 0.89 06/12/2021   BILITOT 0.3 06/12/2021   ALKPHOS 168 (H) 08/17/2018   AST 8 (L) 06/12/2021   ALT 7 (L) 06/12/2021   PROT 8.1 07/30/2021   ALBUMIN 2.2 (L) 08/17/2018   CALCIUM 8.8 06/12/2021   GFRAA 129 12/28/2018   QFTBGOLDPLUS NEGATIVE 07/30/2021    Speciality Comments: No specialty comments available.  Procedures:  No procedures performed Allergies: Meperidine   Assessment / Plan:     Visit Diagnoses: Seropositive rheumatoid arthritis ([place]) - Anti-CCP>250, RF 20, ESR 72, Bilateral knee swelling, bilateral elbow joint contractures, +synovitis: The patient presented today to discuss x-ray findings, lab results, and treatment options for inflammatory arthritis.  Upon initial presentation the patient had bilateral knee joint effusions, tenderness and synovitis over the right ankle joint, bilateral elbow joint flexion contractures, bilateral SI joint sclerosis, and a previous history of extensive psoriasis.  Reviewed lab results with Dr. Estanislado Pandy and the patient today-his results are consistent with rheumatoid arthritis.  He had no erosive changes upon reviewing x-rays.  The diagnosis remains slightly unclear whether his chronic inflammatory arthritis is due to underlying rheumatoid arthritis or psoriatic arthritis.  He has no active psoriasis at this time.  He does not meet clinical criteria for psoriatic arthritis at this time but due to the distribution of his joint pain does  raise the concern for PSA.  We will continue to monitor his symptoms closely.  He will initiate therapy for management of RA.  He presented today to discuss treatment options.  He is currently taking a prednisone taper prescribed by Dr. Estanislado Pandy on 07/31/21.  He has noticed an improvement in his joint pain and inflammation since starting on prednisone.  He continues to have painful range of motion of both knee joints with warmth and swelling bilaterally.  He has tenderness and synovitis over the right ankle joint.  Bilateral elbow joint contractures with tenderness, synovial thickening and synovitis noted.  At his initial office visit on 07/30/2021 Dr. Estanislado Pandy was apprehensive to start the  patient on any immunosuppressive agents due to his history of HIV, latent syphilis, and history of MRSA.  She reached out to Dr. Megan Salon for clearance and according to chart review from 07/31/21: HIV, latent syphilis, and MRSA all remain in remission. He takes Airline pilot as prescribed. Dr. Megan Salon.  He was cleared to start Enbrel or other TNF inhibitors.  Indications, contraindications, potential side effects of Enbrel were discussed today in detail.  All questions were addressed and consent was obtained today.  We will apply for Enbrel through his insurance and once approved he will return to the office for administration of the first injection.  The autotouch device was shown to the patient today in preparation to initiate therapy.   He will remain on the prednisone taper as prescribed.  He was advised to notify us if his joint pain and inflammation persists or worsens.  He will follow up in 8 weeks to assess his response to enbrel.    Medication counseling:   Chest x-ray: No active disease on 08/19/18  Does patient have diagnosis of heart failure?  No  Counseled patient that Enbrel is a TNF blocking agent.  Reviewed Enbrel dose of 50 mg once weekly.  Counseled patient on purpose, proper use, and adverse effects of  Enbrel.  Reviewed the most common adverse effects including infections, headache, and injection site reactions. Discussed that there is the possibility of an increased risk of malignancy but it is not well understood if this increased risk is due to the medication or the disease state.  Advised patient to get yearly dermatology exams due to risk of skin cancer.  Reviewed the importance of regular labs while on Enbrel therapy.  Advised patient to get standing labs one month after starting Enbrel then every 2 months.  Provided patient with standing lab orders.  Counseled patient that Enbrel should be held prior to scheduled surgery.  Counseled patient to avoid live vaccines while on Enbrel.  Advised patient to get annual influenza vaccine and the pneumococcal vaccine as needed.  Provided patient with medication education material and answered all questions.  Patient voiced understanding.  Patient consented to Enbrel.  Will upload consent into the media tab.  Reviewed storage instructions for Enbrel.  Advised initial injection must be administered in office.  Patient voiced understanding.    High risk medication use - The plan is to start the patient on Enbrel 50 mg sq injections once weekly.  Baseline immunosuppressive lab work obtained at initial visit on 07/30/21: TB Gold negative, hepatitis B-, hepatitis C negative, TPMT within normal limits, G6PD within normal limits, SPEP did not reveal any abnormal proteins, and IgG elevated, IgM WNL, and IgA WNL.   CBC and CMP were drawn on 06/12/2021.  He will be due to update lab work in 1 month and every 3 months to monitor for drug toxicity.  Standing orders for CBC and CMP will be placed today. Discussed the importance of holding Enbrel if he develops signs or symptoms of an infection and to resume once the infection is completely cleared. Discussed the importance of yearly skin exams while on Enbrel due to the increased risk for skin cancer.  Psoriasis: He has no  active psoriasis at this time. No fingernail pitting noted.  Hx of extensive psoriasis-diagnosed by Dr. Megan Salon prior to treatment for HIV in the past.   Primary osteoarthritis of both hands: X-rays of both hands were consistent with osteoarthritis on 07/30/2021.  No MCP, intercarpal, radiocarpal joint space narrowing was  noted.  No erosive changes were noted.  X-ray results and lab results were discussed with the patient today in detail.  He has no tenderness or synovitis of MCP or PIP joints at this time.  Minimal PIP and DIP thickening consistent with osteoarthritis noted.  Primary osteoarthritis of both feet: X-rays of both feet were consistent with osteoarthritic changes on 07/30/2021.  He has persistent tenderness and synovitis of the right ankle joint.  His symptoms have improved slightly since starting on prednisone.  Primary osteoarthritis of both knees: X-rays of both knees were obtained on 07/30/2021 which were consistent with mild osteoarthritis and mild chondromalacia patella.  He continues to have painful range of motion of both knee joints with warmth and swelling.  He has noticed some improvement in his pain and inflammation since starting on the prednisone taper prescribed on 07/31/2021.  Contracture of joint of both elbows: X-rays of both elbows were obtained on 07/30/2021 which were consistent with inflammatory arthritis.  Severe humeral ulnar joint space narrowing was noted.  No erosive changes noted.  He has ongoing tenderness, synovitis, and synovial thickening of bilateral elbow joint flexion contractures.  Other medical conditions are listed as follows:   Neuropathy  Pancytopenia ([place]) - 2019. 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   HIV disease ([place]) - Dr. Megan Salon.  In remission.  He is taking biktarvy as prescribed.   History of MRSA infection: Resolved.  Cigarette smoker - 1 ppd x25 yr  Orders: No orders of the defined types were placed in this  encounter.  No orders of the defined types were placed in this encounter.     Follow-Up Instructions: Return in about 8 weeks (around 10/02/2021) for Rheumatoid arthritis.   Ofilia Neas, PA-C  Note - This record has been created using Dragon software.  Chart creation errors have been sought, but may not always  have been located. Such creation errors do not reflect on  the standard of medical care.

## 2021-08-07 ENCOUNTER — Encounter: Payer: Self-pay | Admitting: Physician Assistant

## 2021-08-07 ENCOUNTER — Ambulatory Visit (INDEPENDENT_AMBULATORY_CARE_PROVIDER_SITE_OTHER): Payer: Medicare Other | Admitting: Physician Assistant

## 2021-08-07 ENCOUNTER — Other Ambulatory Visit: Payer: Self-pay

## 2021-08-07 ENCOUNTER — Other Ambulatory Visit (HOSPITAL_COMMUNITY): Payer: Self-pay

## 2021-08-07 ENCOUNTER — Telehealth: Payer: Self-pay | Admitting: Pharmacist

## 2021-08-07 VITALS — BP 145/95 | HR 83 | Ht 73.0 in | Wt 210.0 lb

## 2021-08-07 DIAGNOSIS — Z79899 Other long term (current) drug therapy: Secondary | ICD-10-CM | POA: Diagnosis not present

## 2021-08-07 DIAGNOSIS — L409 Psoriasis, unspecified: Secondary | ICD-10-CM | POA: Diagnosis not present

## 2021-08-07 DIAGNOSIS — F17210 Nicotine dependence, cigarettes, uncomplicated: Secondary | ICD-10-CM

## 2021-08-07 DIAGNOSIS — B20 Human immunodeficiency virus [HIV] disease: Secondary | ICD-10-CM

## 2021-08-07 DIAGNOSIS — M19041 Primary osteoarthritis, right hand: Secondary | ICD-10-CM | POA: Diagnosis not present

## 2021-08-07 DIAGNOSIS — M19071 Primary osteoarthritis, right ankle and foot: Secondary | ICD-10-CM

## 2021-08-07 DIAGNOSIS — M24521 Contracture, right elbow: Secondary | ICD-10-CM

## 2021-08-07 DIAGNOSIS — G629 Polyneuropathy, unspecified: Secondary | ICD-10-CM

## 2021-08-07 DIAGNOSIS — M059 Rheumatoid arthritis with rheumatoid factor, unspecified: Secondary | ICD-10-CM

## 2021-08-07 DIAGNOSIS — M19072 Primary osteoarthritis, left ankle and foot: Secondary | ICD-10-CM

## 2021-08-07 DIAGNOSIS — M19042 Primary osteoarthritis, left hand: Secondary | ICD-10-CM

## 2021-08-07 DIAGNOSIS — Z8614 Personal history of Methicillin resistant Staphylococcus aureus infection: Secondary | ICD-10-CM

## 2021-08-07 DIAGNOSIS — D61818 Other pancytopenia: Secondary | ICD-10-CM

## 2021-08-07 DIAGNOSIS — M170 Bilateral primary osteoarthritis of knee: Secondary | ICD-10-CM

## 2021-08-07 DIAGNOSIS — A528 Late syphilis, latent: Secondary | ICD-10-CM

## 2021-08-07 DIAGNOSIS — M24522 Contracture, left elbow: Secondary | ICD-10-CM

## 2021-08-07 NOTE — Progress Notes (Addendum)
Pharmacy Note  Subjective: Patrick Huber presents today to the [facility] for follow up office visit.  Patient seen by the pharmacist for counseling on Enbrel for rheumatoid arthritis and plaque psoriasis.  He does have history of HIV, latent syphilis, and history of MRSA which all remain in remission. He takes Airline pilot as prescribed. Dr. Megan Salon cleared patient to start Enbrel or other TNF inhibitors.  Diagnosis of heart failure: No  Objective:  CBC    Component Value Date/Time   WBC 6.4 06/12/2021 1415   RBC 4.62 06/12/2021 1415   HGB 13.1 (L) 06/12/2021 1415   HCT 40.2 06/12/2021 1415   HCT 25.1 (L) 08/22/2018 0750   PLT 284 06/12/2021 1415   MCV 87.0 06/12/2021 1415   MCH 28.4 06/12/2021 1415   MCHC 32.6 06/12/2021 1415   RDW 15.8 (H) 06/12/2021 1415   LYMPHSABS 1,583 06/23/2019 1348   MONOABS 0.2 08/17/2018 1527   EOSABS 132 06/23/2019 1348   BASOSABS 39 06/23/2019 1348     CMP     Component Value Date/Time   NA 137 06/12/2021 1415   K 3.8 06/12/2021 1415   CL 101 06/12/2021 1415   CO2 29 06/12/2021 1415   GLUCOSE 128 (H) 06/12/2021 1415   BUN 7 06/12/2021 1415   CREATININE 0.89 06/12/2021 1415   CALCIUM 8.8 06/12/2021 1415   PROT 8.1 07/30/2021 0856   ALBUMIN 2.2 (L) 08/17/2018 1527   AST 8 (L) 06/12/2021 1415   ALT 7 (L) 06/12/2021 1415   ALKPHOS 168 (H) 08/17/2018 1527   BILITOT 0.3 06/12/2021 1415   GFRNONAA 112 12/28/2018 1510   GFRAA 129 12/28/2018 1510     Baseline Immunosuppressant Therapy Labs TB GOLD Quantiferon TB Gold Latest Ref Rng & Units 07/30/2021  Quantiferon TB Gold Plus NEGATIVE NEGATIVE   Hepatitis Panel Hepatitis Latest Ref Rng & Units 07/30/2021  Hep B Surface Ag NON-REACTIVE NON-REACTIVE  Hep B IgM NON-REACTIVE NON-REACTIVE  Hep C Ab 0.0 - 0.9 s/co ratio -  Hep C Ab NON-REACTIVE NON-REACTIVE  Hep C Ab NON-REACTIVE NON-REACTIVE  Hep A IgM Negative -   HIV Lab Results  Component Value Date   HIV REPEATEDLY REACTIVE  (A) 12/28/2018   HIV Reactive (A) 08/17/2018   Immunoglobulins Immunoglobulin Electrophoresis Latest Ref Rng & Units 07/30/2021  IgA  47 - 310 mg/dL 290  IgG 600 - 1,640 mg/dL 2,381(H)  IgM 50 - 300 mg/dL 114   SPEP Serum Protein Electrophoresis Latest Ref Rng & Units 07/30/2021  Total Protein 6.1 - 8.1 g/dL 8.1  Albumin 3.8 - 4.8 g/dL 4.0  Alpha-1 0.2 - 0.3 g/dL 0.4(H)  Alpha-2 0.5 - 0.9 g/dL 0.8  Beta Globulin 0.4 - 0.6 g/dL 0.4  Beta 2 0.2 - 0.5 g/dL 0.4  Gamma Globulin 0.8 - 1.7 g/dL 2.0(H)   G6PD Lab Results  Component Value Date   G6PDH 18.5 07/30/2021   TPMT Lab Results  Component Value Date   TPMT 18 07/30/2021    Chest x-ray: 08/19/18 - no active cardiopulmonary disease  Assessment/Plan:  Counseled patient that Enbrel is a TNF blocking agent.  Counseled patient on purpose, proper use, and adverse effects of Enbrel.  Reviewed the most common adverse effects including infections, headache, and injection site reactions.  Discussed that there is the possibility of an increased risk of malignancy including non-melanoma skin cancer but it is not well understood if this increased risk is due to the medication or the disease state.  Advised patient to  get yearly dermatology exams due to risk of skin cancer.  Counseled patient that Enbrel should be held prior to scheduled surgery.  Counseled patient to avoid live vaccines while on Enbrel.  Recommend annual influenza, PCV 15 or PCV20 or Pneumovax 23, and Shingrix as indicated.  Reviewed the importance of regular labs while on Enbrel therapy.  Will monitor CBC and CMP 1 month after starting and then every 3 months routinely thereafter. Will monitor TB gold annually. Standing orders placed. Provided patient with medication education material and answered all questions.  Patient consented to Enbrel.  Will upload consent into the media tab.  Reviewed storage instructions for Enbrel.  Advised initial injection must be administered in office.     Dermatology referral will be placed at new start visit.  Knox Saliva, PharmD, MPH, BCPS Clinical Pharmacist (Rheumatology and Pulmonology)

## 2021-08-07 NOTE — Patient Instructions (Addendum)
Vaccines You are taking a medication(s) that can suppress your immune system.  The following immunizations are recommended: Flu annually Covid-19  Td/Tdap (tetanus, diphtheria, pertussis) every 10 years Pneumonia (Prevnar 15 then Pneumovax 23 at least 1 year apart.  Alternatively, can take Prevnar 20 without needing additional dose) Shingrix: 2 doses from 4 weeks to 6 months apart  Please check with your PCP to make sure you are up to date.  Standing Labs We placed an order today for your standing lab work.   Please have your standing labs drawn in 1 month then every 3 months thereafter  If possible, please have your labs drawn 2 weeks prior to your appointment so that the provider can discuss your results at your appointment.  Please note that you may see your imaging and lab results in [facility] before we have reviewed them. We may be awaiting multiple results to interpret others before contacting you. Please allow our office up to 72 hours to thoroughly review all of the results before contacting the office for clarification of your results.  We have open lab daily: Monday through Thursday from 1:30-4:30 PM and Friday from 1:30-4:00 PM at the office of Dr. Bo Merino, [facility].   Please be advised, all patients with office appointments requiring lab work will take precedent over walk-in lab work.  If possible, please come for your lab work on Monday and Friday afternoons, as you may experience shorter wait times. The office is located at [address] [place], [place], [place] [address] No appointment is necessary.   Labs are drawn by [facility]. Please bring your co-pay at the time of your lab draw.  You may receive a bill from [facility] for your lab work.  If you wish to have your labs drawn at another location, please call the office 24 hours in advance to send orders.  If you have any questions regarding directions or hours of operation,  please call  [phone].   As a reminder, please drink plenty of water prior to coming for your lab work. Thanks!  Etanercept Injection What is this medication? ETANERCEPT (et a [employer]) is used for the treatment of rheumatoid arthritis. The medicine is also used to treat psoriatic arthritis, ankylosing spondylitis, and psoriasis. This medicine may be used for other purposes; ask your health care provider or pharmacist if you have questions. COMMON BRAND NAME(S): Enbrel What should I tell my care team before I take this medication? They need to know if you have any of these conditions: bleeding disorder cancer diabetes granulomatosis with polyangiitis heart failure HIV or AIDs immune system problems infection such as tuberculosis (TB) or other bacterial, fungal or viral infections liver disease nervous system problems such as Guillain-Barre syndrome, multiple sclerosis or seizures recent or upcoming vaccine an unusual or allergic reaction to etanercept, other medicines, latex, rubber, food, dyes, or preservatives pregnant or trying to get pregnant breast-feeding How should I use this medication? The medicine is injected under the skin. You will be taught how to prepare and give it. Take it as directed on the prescription label. Keep taking it unless your health care provider tells you stop. This medicine comes with INSTRUCTIONS FOR USE. Ask your pharmacist for directions on how to use this medicine. Read the information carefully. Talk to your pharmacist or health care provider if you have questions. If you use a pen, be sure to take off the outer needle cover before using the dose. It is important that you  put your used needles and syringes in a special sharps container. Do not put them in a trash can. If you do not have a sharps container, call your pharmacist or health care provider to get one. A special MedGuide will be given to you by the pharmacist with each prescription and refill. Be sure  to read this information carefully each time. Talk to your health care provider about the use of this medicine in children. While it may be prescribed for children as young as 79 years of age for selected conditions, precautions do apply. Overdosage: If you think you have taken too much of this medicine contact a poison control center or emergency room at once. NOTE: This medicine is only for you. Do not share this medicine with others. What if I miss a dose? If you miss a dose, take it as soon as you can. If it is almost time for your next dose, take only that dose. Do not take double or extra doses. What may interact with this medication? Do not take this medicine with any of the following medications: biologic medicines such as adalimumab, certolizumab, golimumab, infliximab live vaccines rilonacept This medicine may also interact with the following medications: abatacept anakinra biologic medicines such as anifrolumab, baricitinib, belimumab, canakinumab, natalizumab, rituximab, sarilumab, tocilizumab, tofacitinib, upadacitinib, vedolizumab cyclophosphamide sulfasalazine This list may not describe all possible interactions. Give your health care provider a list of all the medicines, herbs, non-prescription drugs, or dietary supplements you use. Also tell them if you smoke, drink alcohol, or use illegal drugs. Some items may interact with your medicine. What should I watch for while using this medication? Visit your health care provider for regular checks on your progress. Tell your health care provider if your symptoms do not start to get better or if they get worse. This medicine may increase your risk of getting an infection. Call your health care provider for advice if you get a fever, chills, sore throat, or other symptoms of a cold or flu. Do not treat yourself. Try to avoid being around people who are sick. If you have not had the measles or chickenpox vaccines, tell your health care  provider right away if you are around someone with these viruses. You will be tested for tuberculosis (TB) before you start this medicine. If your doctor prescribes any medicine for TB, you should start taking the TB medicine before starting this medicine. Make sure to finish the full course of TB medicine. Avoid taking medicines that contain aspirin, acetaminophen, ibuprofen, naproxen, or ketoprofen unless instructed by your health care provider. These medicines may hide fever. Talk to your health care provider about your risk of cancer. You may be more at risk for certain types of cancer if you take this medicine. This medicine can decrease the response to a vaccine. If you need to get vaccinated, tell your health care provider if you have received this medicine. Extra booster doses may be needed. Talk to your health care provider to see if a different vaccination schedule is needed. What side effects may I notice from receiving this medication? Side effects that you should report to your health care provider as soon as possible: allergic reactions (skin rash, itching or hives; swelling of the face, lips, or tongue) changes in vision dizziness heart failure (trouble breathing; fast, irregular heartbeat; sudden weight gain; swelling of the ankles, feet, hands; unusually weak or tired) infection (fever, chills, cough, sore throat, pain or trouble passing urine) light-colored stools liver injury (  dark yellow or brown urine; general ill feeling or flu-like symptoms; loss of appetite, right upper belly pain; unusually weak or tired, yellowing of the eyes or skin) low red blood cell counts (trouble breathing; feeling faint; lightheaded, falls; unusually weak or tired) pain, tingling, numbness in the hands or feet red, scaly patches or raised bumps on the skin seizures unusual bruising or bleeding weakness in the arms or legs Side effects that usually do not require medical attention (report to your  health care provider if they continue or are bothersome): headache nausea pain, redness, or irritation at site where injected vomiting This list may not describe all possible side effects. Call your doctor for medical advice about side effects. You may report side effects to FDA at 1-800-FDA-1088. Where should I keep my medication? Keep out of the reach of children and pets. See product for storage information. Each product may have different instructions. Get rid of any unused medicine after the expiration date. To get rid of medicines that are no longer needed or have expired: Take the medicine to a medicine take-back program. Check with your pharmacy or law enforcement to find a location. If you cannot return the medicine, ask your pharmacist or health care provider how to get rid of this medicine safely. NOTE: This sheet is a summary. It may not cover all possible information. If you have questions about this medicine, talk to your doctor, pharmacist, or health care provider.  2022 Elsevier/Gold Standard (2020-09-01 12:59:29)

## 2021-08-07 NOTE — Telephone Encounter (Addendum)
SAVED a Prior Authorization request to [facility] for ENBREL Mini via CoverMyMeds. Will update once we receive a response  Awaiting chart note from [facility] with Hazel Sams, PA-C today to submit with prior authorization  Key: [id number]  Dx: Rheumatoid arthritis (M05.9)  Patient has Medicare as primary and Medicaid as secondary.  Knox Saliva, PharmD, MPH, BCPS Clinical Pharmacist (Rheumatology and Pulmonology)

## 2021-08-08 NOTE — Telephone Encounter (Signed)
Submitted a FAXED Prior Authorization request to [facility] for ENBREL via CoverMyMeds. Will update once we receive a response.   Key: [id number]

## 2021-08-08 NOTE — Telephone Encounter (Signed)
Please start NEW Enbrel Mini BIV. I deleted the saved CMM d/t incorrect diagnosis. Prior authorization that was saved yesterday was deleted.  Dose: '50mg'$  every 7 days  Dx: Rheumatoid arthritis (M05.9)  Naive to biologics. Patient has Medicare and Medicaid as secondary.  Knox Saliva, PharmD, MPH, BCPS Clinical Pharmacist (Rheumatology and Pulmonology)

## 2021-08-09 ENCOUNTER — Telehealth: Payer: Self-pay

## 2021-08-09 NOTE — Telephone Encounter (Signed)
F/u will occur in Enbrel BIV encounter

## 2021-08-09 NOTE — Telephone Encounter (Signed)
Faxed additional clinical questions to [facility]  Fax: [phone] Phone: [phone]  Knox Saliva, PharmD, MPH, BCPS Clinical Pharmacist (Rheumatology and Pulmonology)

## 2021-08-09 NOTE — Telephone Encounter (Signed)
[employer] prior authorization department left a voicemail (08/08/21 at 5:21 pm) stating they need some additional information for the PA for the Enbrel mini.  For the rheumatoid arthritis we need to know if the patient has had inadequate response or intolerance or contraindication to Methotrexate or inadequate or intolerance to DMARD.  Phone [phone] Case [id number]

## 2021-08-10 ENCOUNTER — Other Ambulatory Visit (HOSPITAL_COMMUNITY): Payer: Self-pay

## 2021-08-10 MED ORDER — ENBREL MINI 50 MG/ML ~~LOC~~ SOCT
50.0000 mg | SUBCUTANEOUS | 0 refills | Status: DC
  Filled 2021-08-10 – 2021-08-13 (×2): qty 4, 28d supply, fill #0

## 2021-08-10 NOTE — Telephone Encounter (Signed)
Delivery instructions have been updated in [facility], medication will be [facility]ri[facility] on [facility]13.  Rx has been processed in [facility] and the patient has no copay at this time.

## 2021-08-10 NOTE — Telephone Encounter (Addendum)
Received notification from [facility] regarding a prior authorization for Enbrel Mini. Authorization has been APPROVED from 08/08/21 until further notice.   Per test claim, patient has no copay (max 30 day supply)  Patient can fill through [facility]: [phone]   Authorization # [id number]  Patient scheduled for Enbrel new start on 08/15/21 '@10'$ :30am. Rx sent to [facility] to be couriered to clinic prior to appt. Routing to [place] M to assist with onboarding needed  Knox Saliva, PharmD, MPH, BCPS Clinical Pharmacist (Rheumatology and Pulmonology)

## 2021-08-13 ENCOUNTER — Other Ambulatory Visit (HOSPITAL_COMMUNITY): Payer: Self-pay

## 2021-08-15 ENCOUNTER — Other Ambulatory Visit (HOSPITAL_COMMUNITY): Payer: Self-pay

## 2021-08-15 ENCOUNTER — Other Ambulatory Visit: Payer: Self-pay

## 2021-08-15 ENCOUNTER — Ambulatory Visit: Payer: Medicare Other | Admitting: Pharmacist

## 2021-08-15 VITALS — BP 145/91 | HR 80

## 2021-08-15 DIAGNOSIS — L409 Psoriasis, unspecified: Secondary | ICD-10-CM

## 2021-08-15 DIAGNOSIS — M059 Rheumatoid arthritis with rheumatoid factor, unspecified: Secondary | ICD-10-CM

## 2021-08-15 DIAGNOSIS — Z79899 Other long term (current) drug therapy: Secondary | ICD-10-CM

## 2021-08-15 DIAGNOSIS — Z7189 Other specified counseling: Secondary | ICD-10-CM

## 2021-08-15 DIAGNOSIS — M130 Polyarthritis, unspecified: Secondary | ICD-10-CM

## 2021-08-15 MED ORDER — PREDNISONE 10 MG PO TABS: ORAL_TABLET | ORAL | 0 refills | Status: DC

## 2021-08-15 MED ORDER — ENBREL MINI 50 MG/ML ~~LOC~~ SOCT
50.0000 mg | SUBCUTANEOUS | 0 refills | Status: DC
  Filled 2021-08-15: qty 8, 56d supply, fill #0
  Filled 2021-09-07: qty 4, 28d supply, fill #0
  Filled 2021-10-04: qty 4, 28d supply, fill #1

## 2021-08-15 NOTE — Progress Notes (Signed)
Pharmacy Note  Subjective:   Patrick Huber presents to clinic today to receive first dose of Enbrel for seropositive RA. Patient has PMH significant for HIV, latent syphilis, and history of MRSA. His infectious disease provider Dr. Megan Salon cleared him to start Enbrel or other TNF inhibitors. His problem list shows psoriasis and per Dr. Hale Bogus recollection, patient had psoriasis when he established care at [facility]. Patient himself does not recall this diagnosis or having psoriasis  Patient running a fever or have signs/symptoms of infection? No  Patient currently on antibiotics for the treatment of infection? No  Patient have any upcoming invasive procedures/surgeries? No  Objective: CMP     Component Value Date/Time   NA 137 06/12/2021 1415   K 3.8 06/12/2021 1415   CL 101 06/12/2021 1415   CO2 29 06/12/2021 1415   GLUCOSE 128 (H) 06/12/2021 1415   BUN 7 06/12/2021 1415   CREATININE 0.89 06/12/2021 1415   CALCIUM 8.8 06/12/2021 1415   PROT 8.1 07/30/2021 0856   ALBUMIN 2.2 (L) 08/17/2018 1527   AST 8 (L) 06/12/2021 1415   ALT 7 (L) 06/12/2021 1415   ALKPHOS 168 (H) 08/17/2018 1527   BILITOT 0.3 06/12/2021 1415   GFRNONAA 112 12/28/2018 1510   GFRAA 129 12/28/2018 1510    CBC    Component Value Date/Time   WBC 6.4 06/12/2021 1415   RBC 4.62 06/12/2021 1415   HGB 13.1 (L) 06/12/2021 1415   HCT 40.2 06/12/2021 1415   HCT 25.1 (L) 08/22/2018 0750   PLT 284 06/12/2021 1415   MCV 87.0 06/12/2021 1415   MCH 28.4 06/12/2021 1415   MCHC 32.6 06/12/2021 1415   RDW 15.8 (H) 06/12/2021 1415   LYMPHSABS 1,583 06/23/2019 1348   MONOABS 0.2 08/17/2018 1527   EOSABS 132 06/23/2019 1348   BASOSABS 39 06/23/2019 1348    Baseline Immunosuppressant Therapy Labs TB GOLD Quantiferon TB Gold Latest Ref Rng & Units 07/30/2021  Quantiferon TB Gold Plus NEGATIVE NEGATIVE   Hepatitis Panel Hepatitis Latest Ref Rng & Units 07/30/2021  Hep B Surface Ag NON-REACTIVE NON-REACTIVE  Hep B IgM  NON-REACTIVE NON-REACTIVE  Hep C Ab 0.0 - 0.9 s/co ratio -  Hep C Ab NON-REACTIVE NON-REACTIVE  Hep C Ab NON-REACTIVE NON-REACTIVE  Hep A IgM Negative -   HIV Lab Results  Component Value Date   HIV REPEATEDLY REACTIVE (A) 12/28/2018   HIV Reactive (A) 08/17/2018   Immunoglobulins Immunoglobulin Electrophoresis Latest Ref Rng & Units 07/30/2021  IgA  47 - 310 mg/dL 290  IgG 600 - 1,640 mg/dL 2,381(H)  IgM 50 - 300 mg/dL 114   SPEP Serum Protein Electrophoresis Latest Ref Rng & Units 07/30/2021  Total Protein 6.1 - 8.1 g/dL 8.1  Albumin 3.8 - 4.8 g/dL 4.0  Alpha-1 0.2 - 0.3 g/dL 0.4(H)  Alpha-2 0.5 - 0.9 g/dL 0.8  Beta Globulin 0.4 - 0.6 g/dL 0.4  Beta 2 0.2 - 0.5 g/dL 0.4  Gamma Globulin 0.8 - 1.7 g/dL 2.0(H)   G6PD Lab Results  Component Value Date   G6PDH 18.5 07/30/2021   TPMT Lab Results  Component Value Date   TPMT 18 07/30/2021    Chest x-ray: 08/19/18 - no active cardiopulmonary disease  Assessment/Plan:  Patient states he picked up bottle of 35 prednisone he believes (he did not think to count) and took medication as prescribed. He read back how he took it multiple times. This morning he took 1.5 tabs (15 mg) and has only 1 tablet remaining.  Per dispense report, the pharmacy did dispense 35 tablets and we performed tablet count calculation together. After reviewing with Dr. Estanislado Pandy, we will send prescription for '10mg'$  once daily x 7 days then '5mg'$  once daily x 7 days. I reviewed directions with him multiple times and he was able to read back directions using the number of tablets.  We reviewed storage, administration, and goals of therapy today.  Demonstrated proper injection technique with Enbrel demo device  Patient able to demonstrate proper injection technique using the teach back method.  Patient self injected in the left lower abdomen with:  2 for a week 1.5 a week (4 days) 1 tab a week  [facility]-Supplied Medication: Enbrel Mini cartridges NDC:  [id number] Lot: [id number] Expiration: 10/2023  Sample Medication: Enbrel Autotouch Connect autoinjector device NDC: [id number] Lot: [id number] Expiration: 08/02/2023  Patient tolerated well.  Observed for 30 mins in office for adverse reaction and none noted.   Patient is to return in 1 month for labs and 6-8 weeks for follow-up appointment (scheduled for 10/02/21).  Standing orders for CBC w diff and CMP w GFR remain in place.   Enbrel approved through insurance .   Rx sent to: [facility]: [phone]  for next month's supply.  Patient provided with pharmacy phone number and advised to save. Advised that pharmacy will reach out in 2-3 weeks to schedule next shipment directly to his home.  All questions encouraged and answered.  Instructed patient to call with any further questions or concerns.  Knox Saliva, PharmD, MPH, BCPS Clinical Pharmacist (Rheumatology and Pulmonology)  08/15/2021 8:06 AM

## 2021-08-15 NOTE — Patient Instructions (Signed)
Your next Enbrel dose is due on 9/21, 9/28, and every 7 days thereafter.  HOLD Enbrel if you have signs/symptoms of an infection, start antibiotics for an infection, or around the time of surgery (you want to ask your surgeon for recommendations on when to stop and when it is okay to restart)  Your prescription will be shipped from [facility]. Their phone number is [phone] They will call to schedule shipment and confirm address in a couple of weeks. They will mail your medication to your home.  Labs are due in 1 month then every 3 months. Lab hours are from Monday to Thursday 1:30-4:30pm and Friday 1:30-4pm. You do not need an appointment if you come for labs during these times.  How to manage an injection site reaction: Remember the 5 C's: COUNTER - leave on the counter at least 30 minutes but up to overnight to bring medication to room temperature. This may help prevent stinging COLD - place something cold (like an ice gel pack or cold water bottle) on the injection site just before cleansing with alcohol. This may help reduce pain CLARITIN - use Claritin (generic name is loratadine) for the first two weeks of treatment or the day of, the day before, and the day after injecting. This will help to minimize injection site reactions CORTISONE CREAM - apply if injection site is irritated and itching CALL ME - if injection site reaction is bigger than the size of your fist, looks infected, blisters, or if you develop hives

## 2021-08-22 ENCOUNTER — Other Ambulatory Visit (HOSPITAL_COMMUNITY): Payer: Self-pay

## 2021-08-29 ENCOUNTER — Ambulatory Visit: Payer: Medicare Other | Admitting: Rheumatology

## 2021-09-04 ENCOUNTER — Telehealth: Payer: Self-pay | Admitting: *Deleted

## 2021-09-04 NOTE — Telephone Encounter (Signed)
Please clarify if the patient remains on prednisone?  Prednisone can cause an elevation in blood glucose level. Please clarify if he has a history of diabetes? He should seek urgent medical attention at urgent care or ED if he is symptomatic and has an elevation in blood sugar.

## 2021-09-04 NOTE — Telephone Encounter (Signed)
Spoke with patient and he states that he is no longer taking Prednisone. Patient states he does not have a history of diabetes but a strong family history of diabetes. Patient advised to seek urgent evaluation  at urgent care or ED if he is symptomatic and has an elevation in blood sugar. Patient expressed understanding.

## 2021-09-04 NOTE — Telephone Encounter (Signed)
Patient states he was started on Enbrel on 08/07/2021. Patient states he has been experiencing excessive thirst, dizziness and blurred vision. Patient states he was at a family members house today who has a glucose meter and checked his blood sugar. Patient states the meter would not give a number and gave message to high. Patient advised he needs to contact his PCP for urgent evaluation and if unable to see them then he needs to be evaluated at urgent care.

## 2021-09-07 ENCOUNTER — Other Ambulatory Visit (HOSPITAL_COMMUNITY): Payer: Self-pay

## 2021-09-10 ENCOUNTER — Other Ambulatory Visit (HOSPITAL_COMMUNITY): Payer: Self-pay

## 2021-09-18 NOTE — Progress Notes (Deleted)
Office Visit Note  Patient: Patrick Huber             Date of Birth: Jan 18, 1971           MRN: [id number]             PCP: Michel Bickers, MD Referring: Michel Bickers, MD Visit Date: 10/02/2021 Occupation: @GUAROCC @  Subjective:  No chief complaint on file.   History of Present Illness: Patrick Huber is a 50 y.o. male ***   Activities of Daily Living:  Patient reports morning stiffness for *** {minute/hour:19697}.   Patient {ACTIONS;DENIES/REPORTS:21021675::"Denies"} nocturnal pain.  Difficulty dressing/grooming: {ACTIONS;DENIES/REPORTS:21021675::"Denies"} Difficulty climbing stairs: {ACTIONS;DENIES/REPORTS:21021675::"Denies"} Difficulty getting out of chair: {ACTIONS;DENIES/REPORTS:21021675::"Denies"} Difficulty using hands for taps, buttons, cutlery, and/or writing: {ACTIONS;DENIES/REPORTS:21021675::"Denies"}  No Rheumatology ROS completed.   PMFS History:  Patient Active Problem List   Diagnosis Date Noted  . Anemia 11/09/2018  . Elevated liver enzymes 11/09/2018  . Neuropathy 10/07/2018  . Bilateral lower extremity edema 09/08/2018  . 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 Malnutrition of moderate degree 08/18/2018  . HIV disease ([place]) 08/18/2018  . Unintentional weight loss 08/18/2018  . 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 Knee mass, right 08/18/2018  . Pancytopenia ([place]) 08/18/2018  . Cigarette smoker 08/18/2018  . GERD (gastroesophageal reflux disease) 08/18/2018  . Psoriasis 08/18/2018  . History of MRSA infection 08/18/2018    Past Medical History:  Diagnosis Date  . Angina pectoris ([place])   . HIV (human immunodeficiency virus infection) ([place])     Family History  Problem Relation Age of Onset  . Heart disease Mother   . Cancer Mother   . Cancer Father   . Heart disease Father   . Cancer Sister   . Heart disease Brother   . Healthy Son   . Healthy Daughter    Past Surgical History:  Procedure Laterality Date  . KNEE ARTHROSCOPY Left   . Right  knee surgery     [facility] History Narrative  . Not on file   Immunization History  Administered Date(s) Administered  . Influenza,inj,Quad PF,6+ Mos 09/02/2019     Objective: Vital Signs: There were no vitals taken for this visit.   Physical Exam   Musculoskeletal Exam: ***  CDAI Exam: CDAI Score: -- Patient Global: --; Provider Global: -- Swollen: --; Tender: -- Joint Exam 10/02/2021   No joint exam has been documented for this visit   There is currently no information documented on the homunculus. Go to the Rheumatology activity and complete the homunculus joint exam.  Investigation: No additional findings.  Imaging: No results found.  Recent Labs: Lab Results  Component Value Date   WBC 6.4 06/12/2021   HGB 13.1 (L) 06/12/2021   PLT 284 06/12/2021   NA 137 06/12/2021   K 3.8 06/12/2021   CL 101 06/12/2021   CO2 29 06/12/2021   GLUCOSE 128 (H) 06/12/2021   BUN 7 06/12/2021   CREATININE 0.89 06/12/2021   BILITOT 0.3 06/12/2021   ALKPHOS 168 (H) 08/17/2018   AST 8 (L) 06/12/2021   ALT 7 (L) 06/12/2021   PROT 8.1 07/30/2021   ALBUMIN 2.2 (L) 08/17/2018   CALCIUM 8.8 06/12/2021   GFRAA 129 12/28/2018   QFTBGOLDPLUS NEGATIVE 07/30/2021    Speciality Comments: No specialty comments available.  Procedures:  No procedures performed Allergies: Meperidine   Assessment / Plan:     Visit Diagnoses: No diagnosis found.  Orders: No orders of the  defined types were placed in this encounter.  No orders of the defined types were placed in this encounter.   Face-to-face time spent with patient was *** minutes. Greater than 50% of time was spent in counseling and coordination of care.  Follow-Up Instructions: No follow-ups on file.   Earnestine Mealing, CMA  Note - This record has been created using Editor, commissioning.  Chart creation errors have been sought, but may not always  have been located. Such creation errors do not  reflect on  the standard of medical care.

## 2021-10-02 ENCOUNTER — Ambulatory Visit: Payer: Medicare Other | Admitting: Rheumatology

## 2021-10-02 DIAGNOSIS — F17210 Nicotine dependence, cigarettes, uncomplicated: Secondary | ICD-10-CM

## 2021-10-02 DIAGNOSIS — M24521 Contracture, right elbow: Secondary | ICD-10-CM

## 2021-10-02 DIAGNOSIS — A528 Late syphilis, latent: Secondary | ICD-10-CM

## 2021-10-02 DIAGNOSIS — Z79899 Other long term (current) drug therapy: Secondary | ICD-10-CM

## 2021-10-02 DIAGNOSIS — M19041 Primary osteoarthritis, right hand: Secondary | ICD-10-CM

## 2021-10-02 DIAGNOSIS — M170 Bilateral primary osteoarthritis of knee: Secondary | ICD-10-CM

## 2021-10-02 DIAGNOSIS — B20 Human immunodeficiency virus [HIV] disease: Secondary | ICD-10-CM

## 2021-10-02 DIAGNOSIS — G629 Polyneuropathy, unspecified: Secondary | ICD-10-CM

## 2021-10-02 DIAGNOSIS — Z8614 Personal history of Methicillin resistant Staphylococcus aureus infection: Secondary | ICD-10-CM

## 2021-10-02 DIAGNOSIS — M059 Rheumatoid arthritis with rheumatoid factor, unspecified: Secondary | ICD-10-CM

## 2021-10-02 DIAGNOSIS — D61818 Other pancytopenia: Secondary | ICD-10-CM

## 2021-10-02 DIAGNOSIS — M19071 Primary osteoarthritis, right ankle and foot: Secondary | ICD-10-CM

## 2021-10-02 DIAGNOSIS — L409 Psoriasis, unspecified: Secondary | ICD-10-CM

## 2021-10-04 ENCOUNTER — Other Ambulatory Visit (HOSPITAL_COMMUNITY): Payer: Self-pay

## 2021-10-09 ENCOUNTER — Other Ambulatory Visit (HOSPITAL_COMMUNITY): Payer: Self-pay

## 2021-11-02 ENCOUNTER — Other Ambulatory Visit (HOSPITAL_COMMUNITY): Payer: Self-pay

## 2021-11-02 ENCOUNTER — Other Ambulatory Visit: Payer: Self-pay | Admitting: Physician Assistant

## 2021-11-02 DIAGNOSIS — M059 Rheumatoid arthritis with rheumatoid factor, unspecified: Secondary | ICD-10-CM

## 2021-11-02 NOTE — Telephone Encounter (Signed)
We can wait to send the refill of enbrel until after his evaluation on Tuesday in case any medication changes are made.

## 2021-11-02 NOTE — Progress Notes (Deleted)
Office Visit Note  Patient: Patrick Huber             Date of Birth: [birth year]           MRN: [id number]             PCP: Michel Bickers, MD Referring: Michel Bickers, MD Visit Date: 11/06/2021 Occupation: @GUAROCC @  Subjective:  No chief complaint on file.   History of Present Illness: Patrick Huber is a 50 y.o. male ***   Activities of Daily Living:  Patient reports morning stiffness for *** {minute/hour:19697}.   Patient {ACTIONS;DENIES/REPORTS:21021675::"Denies"} nocturnal pain.  Difficulty dressing/grooming: {ACTIONS;DENIES/REPORTS:21021675::"Denies"} Difficulty climbing stairs: {ACTIONS;DENIES/REPORTS:21021675::"Denies"} Difficulty getting out of chair: {ACTIONS;DENIES/REPORTS:21021675::"Denies"} Difficulty using hands for taps, buttons, cutlery, and/or writing: {ACTIONS;DENIES/REPORTS:21021675::"Denies"}  No Rheumatology ROS completed.   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   Knee mass, right 08/18/2018   Pancytopenia ([place]) 08/18/2018   Cigarette smoker 08/18/2018   GERD (gastroesophageal reflux disease) 08/18/2018   Psoriasis 08/18/2018   History of MRSA infection 08/18/2018    Past Medical History:  Diagnosis Date   Angina pectoris ([place])    HIV (human immunodeficiency virus infection) ([place])     Family History  Problem Relation Age of Onset   Heart disease Mother    Cancer Mother    Cancer Father    Heart disease Father    Cancer Sister    Heart disease Brother    Healthy Son    Healthy Daughter    Past Surgical History:  Procedure Laterality Date   KNEE ARTHROSCOPY Left    Right knee surgery     [facility] Narrative   Not on file   Immunization History  Administered Date(s) Administered   Influenza,inj,Quad PF,6+ Mos 09/02/2019     Objective: Vital Signs: There were no vitals taken for this visit.   Physical Exam   Musculoskeletal Exam: ***  CDAI Exam: CDAI Score: -- Patient Global: --; Provider Global: -- Swollen: --; Tender: -- Joint Exam 11/06/2021   No joint exam has been documented for this visit   There is currently no information documented on the homunculus. Go to the Rheumatology activity and complete the homunculus joint exam.  Investigation: No additional findings.  Imaging: No results found.  Recent Labs: Lab Results  Component Value Date   WBC 6.4 06/12/2021   HGB 13.1 (L) 06/12/2021   PLT 284 06/12/2021   NA 137 06/12/2021   K 3.8 06/12/2021   CL 101 06/12/2021   CO2 29 06/12/2021   GLUCOSE 128 (H) 06/12/2021   BUN 7 06/12/2021   CREATININE 0.89 06/12/2021   BILITOT 0.3 06/12/2021   ALKPHOS 168 (H) 08/17/2018   AST 8 (L) 06/12/2021   ALT 7 (L) 06/12/2021   PROT 8.1 07/30/2021   ALBUMIN 2.2 (L) 08/17/2018   CALCIUM 8.8 06/12/2021   GFRAA 129 12/28/2018   QFTBGOLDPLUS NEGATIVE 07/30/2021    Speciality Comments: No specialty comments available.  Procedures:  No procedures performed Allergies: Meperidine   Assessment / Plan:     Visit Diagnoses: No diagnosis found.  Orders: No orders of the  defined types were placed in this encounter.  No orders of the defined types were placed in this encounter.   Face-to-face time spent with patient was *** minutes. Greater than 50% of time was spent in counseling and coordination of care.  Follow-Up Instructions: No follow-ups on file.   Earnestine Mealing, CMA  Note - This record has been created using Editor, commissioning.  Chart creation errors have been sought, but may not always  have been located. Such creation errors do not reflect on  the standard of  medical care.

## 2021-11-02 NOTE — Telephone Encounter (Signed)
Next Visit: 11/06/2021  Last Visit: 08/07/2021  Last Fill: 08/15/2021  [id number] rheumatoid arthritis  Current Dose per office note 08/07/2021: Enbrel dose of 50 mg once weekly.   Labs: 06/12/2021 Glucose 128, Globulin 4.1, AST 8, ALT 7, Hgb 13.1, RDW 15.8  TB Gold: 07/30/2021 Neg    Patient overdue for labs and a follow up visit. Scheduled patient a visit for 11/06/2021. Patient states he does not feel like the Enbrel is working as well. Would like to discuss at his appointment.   Okay to refill Enbrel or wait until after appointment on Tuesday?

## 2021-11-06 ENCOUNTER — Other Ambulatory Visit (HOSPITAL_COMMUNITY): Payer: Self-pay

## 2021-11-06 ENCOUNTER — Ambulatory Visit: Payer: Medicare Other | Admitting: Rheumatology

## 2021-11-06 DIAGNOSIS — L409 Psoriasis, unspecified: Secondary | ICD-10-CM

## 2021-11-06 DIAGNOSIS — A528 Late syphilis, latent: Secondary | ICD-10-CM

## 2021-11-06 DIAGNOSIS — F17210 Nicotine dependence, cigarettes, uncomplicated: Secondary | ICD-10-CM

## 2021-11-06 DIAGNOSIS — M19071 Primary osteoarthritis, right ankle and foot: Secondary | ICD-10-CM

## 2021-11-06 DIAGNOSIS — B20 Human immunodeficiency virus [HIV] disease: Secondary | ICD-10-CM

## 2021-11-06 DIAGNOSIS — M24521 Contracture, right elbow: Secondary | ICD-10-CM

## 2021-11-06 DIAGNOSIS — M059 Rheumatoid arthritis with rheumatoid factor, unspecified: Secondary | ICD-10-CM

## 2021-11-06 DIAGNOSIS — G629 Polyneuropathy, unspecified: Secondary | ICD-10-CM

## 2021-11-06 DIAGNOSIS — Z8614 Personal history of Methicillin resistant Staphylococcus aureus infection: Secondary | ICD-10-CM

## 2021-11-06 DIAGNOSIS — M170 Bilateral primary osteoarthritis of knee: Secondary | ICD-10-CM

## 2021-11-06 DIAGNOSIS — Z79899 Other long term (current) drug therapy: Secondary | ICD-10-CM

## 2021-11-06 DIAGNOSIS — M19041 Primary osteoarthritis, right hand: Secondary | ICD-10-CM

## 2021-11-06 DIAGNOSIS — D61818 Other pancytopenia: Secondary | ICD-10-CM

## 2021-11-07 ENCOUNTER — Other Ambulatory Visit (HOSPITAL_COMMUNITY): Payer: Self-pay

## 2021-11-08 ENCOUNTER — Ambulatory Visit: Payer: Medicare Other | Admitting: Physician Assistant

## 2021-11-29 NOTE — Progress Notes (Signed)
Office Visit Note  Patient: Patrick Huber             Date of Birth: [birth year]           MRN: [id number]             PCP: Michel Bickers, MD Referring: Michel Bickers, MD Visit Date: 11/30/2021 Occupation: _0 @  Subjective:  Pain in multiple joints   History of Present Illness: Patrick Huber is a 50 y.o. male with history of seropositive rheumatoid arthritis and osteoarthritis.  Patient was started on Enbrel 50 mg subcutaneous injections once weekly on 08/15/2021.  He tolerated Enbrel without any side effects or injection site reactions.  He does not experience any recurrent infections while on Enbrel consistently.  He has been out of his prescription for Enbrel for about 2 to 3 weeks and is currently having a flare involving multiple joints.  He states that he had to miss his appointment and did not have updated lab work which is fine the is overdue for his prescription refill.  He is currently having pain and swelling in both ankle joints and both knees.  His pain is most severe in the right ankle.  He has had significant difficulty walking and instability for the past 2 days due to the severity of his symptoms.  He has been using a cane to assist with ambulation but has had several falls.  He is also having stiffness in both hands and both shoulder joints.  Overall he has noticed improvement on Enbrel and would like to restart once he has had updated lab work today.  He has been taking ibuprofen as needed for symptomatic relief but has started to have some GI upset with it.  He has not taken prednisone recently.    Activities of Daily Living:  Patient reports morning stiffness for all day. Patient Reports nocturnal pain.  Difficulty dressing/grooming: Reports Difficulty climbing stairs: Reports Difficulty getting out of chair: Reports Difficulty using hands for taps, buttons, cutlery, and/or writing: Reports  Review of Systems  Constitutional:  Negative for fatigue.  HENT:   Negative for mouth sores, mouth dryness and nose dryness.   Eyes:  Negative for pain, itching and dryness.  Respiratory:  Negative for shortness of breath and difficulty breathing.   Cardiovascular:  Negative for palpitations.  Gastrointestinal:  Negative for blood in stool, constipation and diarrhea.  Endocrine: Negative for increased urination.  Genitourinary:  Negative for difficulty urinating.  Musculoskeletal:  Positive for joint pain, joint pain, joint swelling and morning stiffness. Negative for myalgias, muscle tenderness and myalgias.  Skin:  Negative for color change, rash and redness.  Allergic/Immunologic: Negative for susceptible to infections.  Neurological:  Negative for dizziness, numbness, headaches, memory loss and weakness.  Hematological:  Negative for bruising/bleeding tendency.  Psychiatric/Behavioral:  Negative for confusion.    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   Knee mass, right 08/18/2018   Pancytopenia ([place]) 08/18/2018   Cigarette smoker 08/18/2018   GERD (gastroesophageal reflux disease) 08/18/2018   Psoriasis 08/18/2018   History of MRSA infection 08/18/2018    Past Medical History:  Diagnosis Date   Angina pectoris ([place])  HIV (human immunodeficiency virus infection) ([place])     Family History  Problem Relation Age of Onset   Heart disease Mother    Cancer Mother    Cancer Father    Heart disease Father    Cancer Sister    Heart disease Brother    Healthy Son    Healthy Daughter    Past Surgical History:  Procedure Laterality Date   KNEE ARTHROSCOPY Left    Right knee surgery     [facility]   Social  History   Social History Narrative   Not on file   Immunization History  Administered Date(s) Administered   Influenza,inj,Quad PF,6+ Mos 09/02/2019     Objective: Vital Signs: BP 122/80 (BP Location: Right Arm, Patient Position: Sitting, Cuff Size: Large)    Pulse (!) 108    Ht _0  (1.854 m)    Wt 203 lb 12.8 oz (92.4 kg)    BMI 26.89 kg/m    Physical Exam Vitals and nursing note reviewed.  Constitutional:      Appearance: He is well-developed.  HENT:     Head: Normocephalic and atraumatic.  Eyes:     Conjunctiva/sclera: Conjunctivae normal.     Pupils: Pupils are equal, round, and reactive to light.  Pulmonary:     Effort: Pulmonary effort is normal.  Abdominal:     Palpations: Abdomen is soft.  Musculoskeletal:     Cervical back: Normal range of motion and neck supple.  Skin:    General: Skin is warm and dry.     Capillary Refill: Capillary refill takes less than 2 seconds.  Neurological:     Mental Status: He is alert and oriented to person, place, and time.  Psychiatric:        Behavior: Behavior normal.     Musculoskeletal Exam: C-spine is slightly limited range of motion with lateral rotation.  Limited abduction of both shoulders to about 120 degrees.  Bilateral flexion contractures in both elbows.  Tenderness and inflammation along the elbow joint line bilaterally.  Some discomfort with range of motion of both wrist joints.  Tenderness over MCP joints but no synovitis was noted.  Complete fist formation bilaterally.  PIP DIP thickening consistent with osteoarthritis of both hands.  Hip joints difficult to assess in seated position.  Painful range of motion of both knee joints with warmth and swelling noted.  Painful range of motion of both ankle joints with tenderness and synovitis.  CDAI Exam: CDAI Score: 11.8  Patient Global: 10 mm; Provider Global: 8 mm Swollen: 6 ; Tender: 8  Joint Exam 11/30/2021      Right  Left  Glenohumeral   Tender   Tender  Elbow   Swollen Tender  Swollen Tender  Knee  Swollen Tender  Swollen Tender  Ankle  Swollen Tender  Swollen Tender     Investigation: No additional findings.  Imaging: No results found.  Recent Labs: Lab Results  Component Value Date   WBC 6.4 06/12/2021   HGB 13.1 (L) 06/12/2021   PLT 284 06/12/2021   NA 137 06/12/2021   K 3.8 06/12/2021   CL 101 06/12/2021   CO2 29 06/12/2021   GLUCOSE 128 (H) 06/12/2021   BUN 7 06/12/2021   CREATININE 0.89 06/12/2021   BILITOT 0.3 06/12/2021   ALKPHOS 168 (H) 08/17/2018   AST 8 (L) 06/12/2021   ALT 7 (L) 06/12/2021   PROT 8.1 07/30/2021   ALBUMIN 2.2 (L) 08/17/2018  CALCIUM 8.8 06/12/2021   GFRAA 129 12/28/2018   QFTBGOLDPLUS NEGATIVE 07/30/2021    Speciality Comments: No specialty comments available.  Procedures:  No procedures performed Allergies: Meperidine   Assessment / Plan:     Visit Diagnoses: Seropositive rheumatoid arthritis ([place]) - Anti-CCP>250, RF 20, ESR 72, Bilateral knee swelling, bilateral elbow joint contractures, +synovitis: Patient presents today having a flare involving multiple joints.  He is currently having stiffness and discomfort in both shoulders, both elbows, and both wrist joints.  He has tenderness and inflammation along the elbow joint line bilaterally.  Bilateral elbow joint flexion contractures noted.  His pain has been most severe in both knee joints and both ankle joints.  He has had difficulty ambulating and has had several falls over the past few days due to the instability.  Warmth and swelling of both knees and both ankles were noted.  He was started on Enbrel 50 mg subcu injections on 08/15/2021.  He was tolerating Enbrel without any side effects or injection site reactions.  He had noticed again clinical improvement while on Enbrel consistently.  He ran out of prescription refills 2 to 3 weeks ago due to requiring updated lab work as well as a follow-up visit.  He has been taking ibuprofen for symptomatic  relief but is started to have GI upset.  He has not taken prednisone recently.  He is in severe pain and is currently rating his pain a 15 out of 10.  A prednisone taper starting at 20 mg tapering by 5 mg every week and then reducing to 2.5 mg for 1 week will be sent to the pharmacy today.  CBC and CMP will be updated today.  A prescription for Enbrel will be sent to the pharmacy.  Discussed the importance of remaining compliant with office visits and lab work.  We will follow-up in the office in 3 months at which time we can repeat his lab work.  He was advised to notify us if he cannot tolerate Enbrel, has frequent infections, or has recurrent flares.  High risk medication use - Enbrel 50 mg sq injections once weekly. Enbrel started 08/15/21.  TB gold negative on 07/30/21.  CBC and CMP drawn on 06/12/2021.  He is overdue to update lab work.  Orders for CBC and CMP were released.  His next lab work will be due at the end of March/early April.  Discussed the importance of remaining compliant with lab work every 3 months to monitor for drug toxicity.- Plan: CBC with Differential/Platelet, COMPLETE METABOLIC PANEL WITH GFR Discussed the importance of holding Enbrel if he develops signs or symptoms of an infection and to resume once the infection has completely cleared.  Psoriasis: Improved on Enbrel.  Primary osteoarthritis of both hands - X-rays of both hands were consistent with osteoarthritis on 07/30/2021.  No MCP, intercarpal, radiocarpal joint space narrowing was noted.  He is currently experiencing stiffness in both hands.  Tenderness over MCP joints noted.  Primary osteoarthritis of both feet - X-rays of both feet were consistent with osteoarthritic changes on 07/30/2021.  He presents today with severe pain and inflammation involving both ankle joints.  His symptoms have been most severe in the right ankle.  He has been having difficulty ambulating and has had several falls over the past few days.  He has  been using a cane to assist with ambulation.  A prednisone taper sent to the pharmacy as discussed above.  Primary osteoarthritis of both knees - X-rays of  both knees were obtained on 07/30/2021 which were consistent with mild osteoarthritis and mild chondromalacia patella.  He presents today with severe pain in both knee joints.  Warmth and swelling noted in both knees.  He has had difficulty ambulating and is using a cane to assist with ambulation.  Contracture of joint of both elbows - X-rays of both elbows were obtained on 07/30/2021 which were consistent with inflammatory arthritis.  Unchanged.  He has tenderness and inflammation along the elbow joint line bilaterally.  Other medical conditions are listed as follows:   Neuropathy  Late latent syphilis  Pancytopenia ([place]) - 2019. Resolved  History of MRSA infection - Resolved.  HIV disease ([place]) - Dr. Megan Salon.  In remission.  He is taking biktarvy as prescribed.   Cigarette smoker - 1 ppd x25 yr  Orders: Orders Placed This Encounter  Procedures   CBC with Differential/Platelet   COMPLETE METABOLIC PANEL WITH GFR   Meds ordered this encounter  Medications   predniSONE (DELTASONE) 5 MG tablet    Sig: Take 4 tablets daily x1 week, 3 tablets daily x1 week, 2 tablets daily x1 week, 1 tablet daily x1 week, half tablet daily x1 week.    Dispense:  74 tablet    Refill:  0   Etanercept (ENBREL MINI) 50 MG/ML SOCT    Sig: Inject 50 mg into the skin once a week.    Dispense:  12 mL    Refill:  0     Follow-Up Instructions: Return in about 3 months (around 02/28/2022) for Rheumatoid arthritis, Osteoarthritis.   Ofilia Neas, PA-C  Note - This record has been created using Dragon software.  Chart creation errors have been sought, but may not always  have been located. Such creation errors do not reflect on  the standard of medical care.

## 2021-11-30 ENCOUNTER — Other Ambulatory Visit: Payer: Self-pay

## 2021-11-30 ENCOUNTER — Encounter: Payer: Self-pay | Admitting: Physician Assistant

## 2021-11-30 ENCOUNTER — Ambulatory Visit (INDEPENDENT_AMBULATORY_CARE_PROVIDER_SITE_OTHER): Payer: Medicare Other | Admitting: Physician Assistant

## 2021-11-30 ENCOUNTER — Other Ambulatory Visit (HOSPITAL_COMMUNITY): Payer: Self-pay

## 2021-11-30 VITALS — BP 122/80 | HR 108 | Ht 73.0 in | Wt 203.8 lb

## 2021-11-30 DIAGNOSIS — M24522 Contracture, left elbow: Secondary | ICD-10-CM

## 2021-11-30 DIAGNOSIS — B20 Human immunodeficiency virus [HIV] disease: Secondary | ICD-10-CM

## 2021-11-30 DIAGNOSIS — M170 Bilateral primary osteoarthritis of knee: Secondary | ICD-10-CM

## 2021-11-30 DIAGNOSIS — G629 Polyneuropathy, unspecified: Secondary | ICD-10-CM

## 2021-11-30 DIAGNOSIS — Z8614 Personal history of Methicillin resistant Staphylococcus aureus infection: Secondary | ICD-10-CM

## 2021-11-30 DIAGNOSIS — M19041 Primary osteoarthritis, right hand: Secondary | ICD-10-CM

## 2021-11-30 DIAGNOSIS — A528 Late syphilis, latent: Secondary | ICD-10-CM

## 2021-11-30 DIAGNOSIS — Z79899 Other long term (current) drug therapy: Secondary | ICD-10-CM | POA: Diagnosis not present

## 2021-11-30 DIAGNOSIS — M19072 Primary osteoarthritis, left ankle and foot: Secondary | ICD-10-CM

## 2021-11-30 DIAGNOSIS — L409 Psoriasis, unspecified: Secondary | ICD-10-CM | POA: Diagnosis not present

## 2021-11-30 DIAGNOSIS — M059 Rheumatoid arthritis with rheumatoid factor, unspecified: Secondary | ICD-10-CM

## 2021-11-30 DIAGNOSIS — F17210 Nicotine dependence, cigarettes, uncomplicated: Secondary | ICD-10-CM

## 2021-11-30 DIAGNOSIS — D61818 Other pancytopenia: Secondary | ICD-10-CM

## 2021-11-30 DIAGNOSIS — M24521 Contracture, right elbow: Secondary | ICD-10-CM

## 2021-11-30 DIAGNOSIS — M19042 Primary osteoarthritis, left hand: Secondary | ICD-10-CM

## 2021-11-30 DIAGNOSIS — M19071 Primary osteoarthritis, right ankle and foot: Secondary | ICD-10-CM

## 2021-11-30 MED ORDER — PREDNISONE 5 MG PO TABS: ORAL_TABLET | ORAL | 0 refills | Status: DC

## 2021-11-30 MED ORDER — ENBREL MINI 50 MG/ML ~~LOC~~ SOCT
50.0000 mg | SUBCUTANEOUS | 0 refills | Status: DC
Start: 2021-11-30 — End: 2022-02-12
  Filled 2021-11-30: qty 4, 28d supply, fill #0
  Filled 2021-12-24: qty 4, 28d supply, fill #1
  Filled 2022-01-21: qty 4, 28d supply, fill #2

## 2021-12-01 LAB — COMPLETE METABOLIC PANEL WITH GFR
AG Ratio: 1.1 (calc) (ref 1.0–2.5)
ALT: 8 U/L — ABNORMAL LOW (ref 9–46)
AST: 15 U/L (ref 10–35)
Albumin: 3.8 g/dL (ref 3.6–5.1)
Alkaline phosphatase (APISO): 103 U/L (ref 35–144)
BUN: 8 mg/dL (ref 7–25)
CO2: 29 mmol/L (ref 20–32)
Calcium: 8.6 mg/dL (ref 8.6–10.3)
Chloride: 101 mmol/L (ref 98–110)
Creat: 0.7 mg/dL (ref 0.70–1.30)
Globulin: 3.5 g/dL (calc) (ref 1.9–3.7)
Glucose, Bld: 117 mg/dL — ABNORMAL HIGH (ref 65–99)
Potassium: 3.7 mmol/L (ref 3.5–5.3)
Sodium: 138 mmol/L (ref 135–146)
Total Bilirubin: 0.4 mg/dL (ref 0.2–1.2)
Total Protein: 7.3 g/dL (ref 6.1–8.1)
eGFR: 112 mL/min/{1.73_m2} (ref >=60)

## 2021-12-01 LAB — CBC WITH DIFFERENTIAL/PLATELET
Absolute Monocytes: 390 cells/uL (ref 200–950)
Basophils Absolute: 20 cells/uL (ref 0–200)
Basophils Relative: 0.4 %
Eosinophils Absolute: 80 cells/uL (ref 15–500)
Eosinophils Relative: 1.6 %
HCT: 42.7 % (ref 38.5–50.0)
Hemoglobin: 14 g/dL (ref 13.2–17.1)
Lymphs Abs: 1515 cells/uL (ref 850–3900)
MCH: 28.9 pg (ref 27.0–33.0)
MCHC: 32.8 g/dL (ref 32.0–36.0)
MCV: 88 fL (ref 80.0–100.0)
MPV: 10.1 fL (ref 7.5–12.5)
Monocytes Relative: 7.8 %
Neutro Abs: 2995 cells/uL (ref 1500–7800)
Neutrophils Relative %: 59.9 %
Platelets: 204 10*3/uL (ref 140–400)
RBC: 4.85 10*6/uL (ref 4.20–5.80)
RDW: 13.9 % (ref 11.0–15.0)
Total Lymphocyte: 30.3 %
WBC: 5 10*3/uL (ref 3.8–10.8)

## 2021-12-03 ENCOUNTER — Other Ambulatory Visit (HOSPITAL_COMMUNITY): Payer: Self-pay

## 2021-12-04 NOTE — Progress Notes (Signed)
Glucose is 117. ALT is borderline low.  AST WNL.  Rest of CMP WNL. CBC WNL.

## 2021-12-12 ENCOUNTER — Telehealth: Payer: Self-pay

## 2021-12-12 ENCOUNTER — Other Ambulatory Visit (HOSPITAL_COMMUNITY): Payer: Self-pay

## 2021-12-12 NOTE — Telephone Encounter (Signed)
Received fax from [facility] stating that a PA is needed for Enbrel. Pt was previously approved indefinitely through [facility].  Submitted a Prior Authorization request to [facility] for ENBREL via CoverMyMeds. Will update once we receive a response.   Key: [id number]

## 2021-12-13 NOTE — Telephone Encounter (Signed)
Received a fax regarding Prior Authorization from [facility] for ENBREL. Authorization has been DENIED because patient does not have previous 33-month trial and failure, contraindication, intolerance to one DMARD (methotrexate, leflunomide, sulfasalazine).  Peer-to-Peer reviewer Phone# [phone]

## 2021-12-21 NOTE — Telephone Encounter (Signed)
Submitted an URGENT appeal to [facility] for ENBREL.  Phone: [phone] Fax: [phone] PA Case #: [id number]

## 2021-12-24 ENCOUNTER — Other Ambulatory Visit (HOSPITAL_COMMUNITY): Payer: Self-pay

## 2021-12-27 ENCOUNTER — Other Ambulatory Visit (HOSPITAL_COMMUNITY): Payer: Self-pay

## 2021-12-27 NOTE — Telephone Encounter (Signed)
Called [employer] for ENBREL appeal status. Per rep, authorization has been APPROVED from 12/12/21 to 06/21/22. Rep cannnot provide approval letter.  Patient can continue to fill through [place]: [phone]   PA Case #: [id number] Appeal case: [id number] Phone: [phone]  Knox Saliva, PharmD, MPH, BCPS Clinical Pharmacist (Rheumatology and Pulmonology)

## 2022-01-17 ENCOUNTER — Other Ambulatory Visit (HOSPITAL_COMMUNITY): Payer: Self-pay

## 2022-01-21 ENCOUNTER — Other Ambulatory Visit (HOSPITAL_COMMUNITY): Payer: Self-pay

## 2022-01-24 ENCOUNTER — Other Ambulatory Visit (HOSPITAL_COMMUNITY): Payer: Self-pay

## 2022-02-04 ENCOUNTER — Other Ambulatory Visit: Payer: Self-pay | Admitting: Rheumatology

## 2022-02-04 DIAGNOSIS — E119 Type 2 diabetes mellitus without complications: Secondary | ICD-10-CM

## 2022-02-05 DIAGNOSIS — Z0289 Encounter for other administrative examinations: Secondary | ICD-10-CM | POA: Insufficient documentation

## 2022-02-12 ENCOUNTER — Other Ambulatory Visit: Payer: Self-pay | Admitting: Physician Assistant

## 2022-02-12 ENCOUNTER — Other Ambulatory Visit (HOSPITAL_COMMUNITY): Payer: Self-pay

## 2022-02-12 DIAGNOSIS — M059 Rheumatoid arthritis with rheumatoid factor, unspecified: Secondary | ICD-10-CM

## 2022-02-12 MED ORDER — ENBREL MINI 50 MG/ML ~~LOC~~ SOCT
50.0000 mg | SUBCUTANEOUS | 0 refills | Status: DC
  Filled 2022-02-20: qty 4, 28d supply, fill #0
  Filled 2022-03-14: qty 4, 28d supply, fill #1
  Filled 2022-04-11: qty 4, 28d supply, fill #2

## 2022-02-12 NOTE — Telephone Encounter (Signed)
Next Visit: 02/28/2022 ? ?Last Visit: 11/30/2021 ? ?Last Fill: 11/30/2021 ? ?[id number] rheumatoid arthritis  ? ?Current Dose per office note 11/30/2021: Enbrel 50 mg sq injections once weekly ? ?Labs: 11/30/2021 Glucose is 117. ALT is borderline low.  AST WNL.  Rest of CMP WNL. CBC WNL.   ? ?TB Gold: 07/30/2021 Neg  ? ?Okay to refill Enbrel?  ?

## 2022-02-14 ENCOUNTER — Other Ambulatory Visit (HOSPITAL_COMMUNITY): Payer: Self-pay

## 2022-02-15 NOTE — Progress Notes (Signed)
Office Visit Note  Patient: Patrick Huber             Date of Birth: [birth year]           MRN: [id number]             PCP: Patrick Asters, MD Referring: Patrick Asters, MD Visit Date: 02/28/2022 Occupation: @GUAROCC @  Subjective:  Medication management  History of Present Illness: Patrick Huber is a 51 y.o. male with history of seropositive rheumatoid arthritis, psoriasis and osteoarthritis.  He states his rheumatoid arthritis and psoriasis is well controlled on Enbrel.  He has been taking Enbrel left on weekly basis without any side effects.  He continues to have some discomfort in his knee joints from osteoarthritis.  He is going to pain management now.  He states that about a month ago he was placed on diclofenac 50 mg twice daily for joint pain.  He has noticed improvement in his joint pain.  Neuropathy is better on gabapentin.  Activities of Daily Living:  Patient reports morning stiffness for 3 hours.   Patient Reports nocturnal pain.  Difficulty dressing/grooming: Denies Difficulty climbing stairs: Reports Difficulty getting out of chair: Reports Difficulty using hands for taps, buttons, cutlery, and/or writing: Reports  Review of Systems  Constitutional:  Positive for fatigue.  HENT:  Negative for mouth dryness.   Eyes:  Negative for dryness.  Respiratory:  Negative for shortness of breath.   Cardiovascular:  Positive for swelling in legs/feet.  Gastrointestinal:  Negative for constipation.  Endocrine: Negative for excessive thirst.  Genitourinary:  Negative for difficulty urinating.  Musculoskeletal:  Positive for joint pain, joint pain, morning stiffness and muscle tenderness. Negative for joint swelling.  Skin:  Negative for rash.  Allergic/Immunologic: Negative for susceptible to infections.  Neurological:  Negative for numbness.  Hematological:  Negative for bruising/bleeding tendency.  Psychiatric/Behavioral:  Positive for sleep disturbance.    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   Knee mass, right 08/18/2018   Pancytopenia (HCC) 08/18/2018   Cigarette smoker 08/18/2018   GERD (gastroesophageal reflux disease) 08/18/2018   Psoriasis 08/18/2018   History of MRSA infection 08/18/2018    Past Medical History:  Diagnosis Date   Angina pectoris (HCC)    HIV (human immunodeficiency virus infection) (HCC)    Seropositive rheumatoid arthritis (HCC)     Family History  Problem Relation Age of Onset   Heart disease Mother    Cancer Mother    Cancer Father    Heart disease Father    Cancer Sister    Heart disease Brother    Healthy Son    Healthy Daughter    Past Surgical History:  Procedure Laterality Date   KNEE ARTHROSCOPY Left    Right knee surgery     [facility] - at [facility]   Social History   Social History Narrative   Not on file   Immunization History  Administered Date(s) Administered   Influenza,inj,Quad PF,6+ Mos 09/02/2019     Objective: Vital Signs: BP (!) 147/78 (BP Location: Left Arm, Patient Position: Sitting, Cuff Size: Normal)   Pulse 86   Resp 15   Ht 6\' 1"  (  1.854 m)   Wt 204 lb (92.5 kg)   BMI 26.91 kg/m    Physical Exam Vitals and nursing note reviewed.  Constitutional:      Appearance: He is well-developed.  HENT:     Head: Normocephalic and atraumatic.  Eyes:     Conjunctiva/sclera: Conjunctivae normal.     Pupils: Pupils are equal, round, and reactive to light.  Cardiovascular:     Rate and Rhythm: Normal rate and regular rhythm.     Heart sounds: Normal heart sounds.  Pulmonary:     Effort: Pulmonary effort is normal.     Breath sounds: Normal breath  sounds.  Abdominal:     General: Bowel sounds are normal.     Palpations: Abdomen is soft.  Musculoskeletal:     Cervical back: Normal range of motion and neck supple.  Skin:    General: Skin is warm and dry.     Capillary Refill: Capillary refill takes less than 2 seconds.  Neurological:     Mental Status: He is alert and oriented to person, place, and time.  Psychiatric:        Behavior: Behavior normal.     Musculoskeletal Exam: He had limited range of motion of the cervical spine with lateral rotation.  He had painful range of motion of bilateral shoulders with abduction up to 160 degrees.  He had flexion contractures bilateral elbows.  He had bilateral PIP and DIP thickening with no synovitis.  Hip joints her in good range of motion.  Knee joints in good range of motion with no warmth swelling or effusion.  He had discomfort range of motion of bilateral knee joints.  There was no tenderness over ankles or MTPs.  CDAI Exam: CDAI Score: 2.6  Patient Global: 4 mm; Provider Global: 2 mm Swollen: 0 ; Tender: 2  Joint Exam 02/28/2022      Right  Left  Knee   Tender   Tender     Investigation: No additional findings.  Imaging: No results found.  Recent Labs: Lab Results  Component Value Date   WBC 5.0 11/30/2021   HGB 14.0 11/30/2021   PLT 204 11/30/2021   NA 138 11/30/2021   K 3.7 11/30/2021   CL 101 11/30/2021   CO2 29 11/30/2021   GLUCOSE 117 (H) 11/30/2021   BUN 8 11/30/2021   CREATININE 0.70 11/30/2021   BILITOT 0.4 11/30/2021   ALKPHOS 168 (H) 08/17/2018   AST 15 11/30/2021   ALT 8 (L) 11/30/2021   PROT 7.3 11/30/2021   ALBUMIN 2.2 (L) 08/17/2018   CALCIUM 8.6 11/30/2021   GFRAA 129 12/28/2018   QFTBGOLDPLUS NEGATIVE 07/30/2021    Speciality Comments: No specialty comments available.  Procedures:  No procedures performed Allergies: Meperidine and Meperidine hcl   Assessment / Plan:     Visit Diagnoses: Seropositive rheumatoid arthritis (HCC) -  Anti-CCP>250, RF 20, ESR 72, Bilateral knee swelling, bilateral elbow joint contractures, +synovitis: He had an excellent response to Enbrel.  He had no synovitis on examination today.  He still have some discomfort in his knees most likely due to underlying osteoarthritis.  High risk medication use - Enbrel 50 mg sq injections once weekly. Enbrel started 08/15/21.  Labs were normal in December 2022.  TB gold was negative on July 30, 2021.  Left findings were discussed with the patient.  We will get labs today and then every 3 months to monitor for drug toxicity.- Plan: CBC with Differential/Platelet, COMPLETE METABOLIC PANEL WITH  GFR.  He is advised to get annual skin examination to screen for skin cancer while he is on Enbrel.  He was advised to stop Enbrel in case he develops an infection.  Information on immunization was placed in the AVS.  Psoriasis - Improved on Enbrel.  He had no active lesions today.  Primary osteoarthritis of both hands - X-rays of both hands were consistent with osteoarthritis on 07/30/2021.  No MCP, intercarpal, radiocarpal joint space narrowing was noted.  Contracture of joint of both elbows - X-rays of both elbows were obtained on 07/30/2021 which were consistent with inflammatory arthritis.  The synovitis in his elbow joints has completely resolved.  Primary osteoarthritis of both knees - X-rays of both knees were obtained on 07/30/2021 which were consistent with mild osteoarthritis and mild chondromalacia patella.  He continues to have discomfort in his knee joints.  He is going to pain management and is on combination of diclofenac and hydrocodone.  Side effects of diclofenac were also discussed.  Primary osteoarthritis of both feet - X-rays of both feet were consistent with osteoarthritic changes on 07/30/2021.  No synovitis was noted.  Other medical problems are listed as follows:  Late latent syphilis  Neuropathy-he is on gabapentin.  History of MRSA infection -  Resolved.  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  Cigarette smoker - 1 ppd x25 yr.  Smoking cessation was discussed.  Association of smoking with rheumatoid arthritis was discussed.  Orders: Orders Placed This Encounter  Procedures   CBC with Differential/Platelet   COMPLETE METABOLIC PANEL WITH GFR   No orders of the defined types were placed in this encounter.    Follow-Up Instructions: Return in about 5 months (around 07/31/2022) for Rheumatoid arthritis, Osteoarthritis.   Pollyann Savoy, MD  Note - This record has been created using Animal nutritionist.  Chart creation errors have been sought, but may not always  have been located. Such creation errors do not reflect on  the standard of medical care.

## 2022-02-20 ENCOUNTER — Other Ambulatory Visit (HOSPITAL_COMMUNITY): Payer: Self-pay

## 2022-02-26 ENCOUNTER — Other Ambulatory Visit (HOSPITAL_COMMUNITY): Payer: Self-pay

## 2022-02-26 DIAGNOSIS — F1190 Opioid use, unspecified, uncomplicated: Secondary | ICD-10-CM | POA: Insufficient documentation

## 2022-02-28 ENCOUNTER — Encounter: Payer: Self-pay | Admitting: Rheumatology

## 2022-02-28 ENCOUNTER — Ambulatory Visit (INDEPENDENT_AMBULATORY_CARE_PROVIDER_SITE_OTHER): Payer: Medicare Other | Admitting: Rheumatology

## 2022-02-28 VITALS — BP 147/78 | HR 86 | Resp 15 | Ht 73.0 in | Wt 204.0 lb

## 2022-02-28 DIAGNOSIS — M19042 Primary osteoarthritis, left hand: Secondary | ICD-10-CM

## 2022-02-28 DIAGNOSIS — L409 Psoriasis, unspecified: Secondary | ICD-10-CM

## 2022-02-28 DIAGNOSIS — M059 Rheumatoid arthritis with rheumatoid factor, unspecified: Secondary | ICD-10-CM | POA: Diagnosis not present

## 2022-02-28 DIAGNOSIS — Z79899 Other long term (current) drug therapy: Secondary | ICD-10-CM

## 2022-02-28 DIAGNOSIS — F17210 Nicotine dependence, cigarettes, uncomplicated: Secondary | ICD-10-CM

## 2022-02-28 DIAGNOSIS — M19041 Primary osteoarthritis, right hand: Secondary | ICD-10-CM

## 2022-02-28 DIAGNOSIS — M19072 Primary osteoarthritis, left ankle and foot: Secondary | ICD-10-CM

## 2022-02-28 DIAGNOSIS — M24521 Contracture, right elbow: Secondary | ICD-10-CM

## 2022-02-28 DIAGNOSIS — M170 Bilateral primary osteoarthritis of knee: Secondary | ICD-10-CM

## 2022-02-28 DIAGNOSIS — D61818 Other pancytopenia: Secondary | ICD-10-CM

## 2022-02-28 DIAGNOSIS — G629 Polyneuropathy, unspecified: Secondary | ICD-10-CM

## 2022-02-28 DIAGNOSIS — B20 Human immunodeficiency virus [HIV] disease: Secondary | ICD-10-CM

## 2022-02-28 DIAGNOSIS — M24522 Contracture, left elbow: Secondary | ICD-10-CM

## 2022-02-28 DIAGNOSIS — A528 Late syphilis, latent: Secondary | ICD-10-CM

## 2022-02-28 DIAGNOSIS — Z8614 Personal history of Methicillin resistant Staphylococcus aureus infection: Secondary | ICD-10-CM

## 2022-02-28 DIAGNOSIS — M19071 Primary osteoarthritis, right ankle and foot: Secondary | ICD-10-CM

## 2022-02-28 NOTE — Patient Instructions (Signed)
Standing Labs ?We placed an order today for your standing lab work.  ? ?Please have your standing labs drawn in June and every 3 months ? ?If possible, please have your labs drawn 2 weeks prior to your appointment so that the provider can discuss your results at your appointment. ? ?Please note that you may see your imaging and lab results in [facility] before we have reviewed them. ?We may be awaiting multiple results to interpret others before contacting you. ?Please allow our office up to 72 hours to thoroughly review all of the results before contacting the office for clarification of your results. ? ?We have open lab daily: ?Monday through Thursday from 1:30-4:30 PM and Friday from 1:30-4:00 PM ?at the office of Dr. Bo Merino, [facility].   ?Please be advised, all patients with office appointments requiring lab work will take precedent over walk-in lab work.  ?If possible, please come for your lab work on Monday and Friday afternoons, as you may experience shorter wait times. ?The office is located at [address], [place], [place], [place] [address] ?No appointment is necessary.   ?Labs are drawn by [facility]. Please bring your co-pay at the time of your lab draw.  You may receive a bill from [facility] for your lab work. ? ?Please note if you are on Hydroxychloroquine and and an order has been placed for a Hydroxychloroquine level, you will need to have it drawn 4 hours or more after your last dose. ? ?If you wish to have your labs drawn at another location, please call the office 24 hours in advance to send orders. ? ?If you have any questions regarding directions or hours of operation,  ?please call [phone].   ?As a reminder, please drink plenty of water prior to coming for your lab work. Thanks!  ? ?Vaccines ?You are taking a medication(s) that can suppress your immune system.  The following immunizations are recommended: ?Flu annually ?Covid-19  ?Td/Tdap (tetanus, diphtheria, pertussis)  every 10 years ?Pneumonia (Prevnar 15 then Pneumovax 23 at least 1 year apart.  Alternatively, can take Prevnar 20 without needing additional dose) ?Shingrix: 2 doses from 4 weeks to 6 months apart ? ?Please check with your PCP to make sure you are up to date.  ? ?If you have signs or symptoms of an infection or start antibiotics: ?First, call your PCP for workup of your infection. ?Hold your medication through the infection, until you complete your antibiotics, and until symptoms resolve if you take the following: ?Injectable medication (Actemra, Benlysta, Cimzia, Cosentyx, Enbrel, Humira, Kevzara, Orencia, Remicade, Simponi, [place], [place], [place]) ?Methotrexate ?Leflunomide Jolee Ewing) ?Mycophenolate (Cellcept) ?Roma Kayser, or Rinvoq  ? ?Please get an yearly skin examination by dermatologist to screen for skin cancer while you are on Enbrel. ?

## 2022-03-01 LAB — CBC WITH DIFFERENTIAL/PLATELET
Absolute Monocytes: 431 cells/uL (ref 200–950)
Basophils Absolute: 28 cells/uL (ref 0–200)
Basophils Relative: 0.5 %
Eosinophils Absolute: 90 cells/uL (ref 15–500)
Eosinophils Relative: 1.6 %
HCT: 42 % (ref 38.5–50.0)
Hemoglobin: 13.7 g/dL (ref 13.2–17.1)
Lymphs Abs: 1764 cells/uL (ref 850–3900)
MCH: 28.2 pg (ref 27.0–33.0)
MCHC: 32.6 g/dL (ref 32.0–36.0)
MCV: 86.4 fL (ref 80.0–100.0)
MPV: 9.3 fL (ref 7.5–12.5)
Monocytes Relative: 7.7 %
Neutro Abs: 3287 cells/uL (ref 1500–7800)
Neutrophils Relative %: 58.7 %
Platelets: 275 10*3/uL (ref 140–400)
RBC: 4.86 10*6/uL (ref 4.20–5.80)
RDW: 16.3 % — ABNORMAL HIGH (ref 11.0–15.0)
Total Lymphocyte: 31.5 %
WBC: 5.6 10*3/uL (ref 3.8–10.8)

## 2022-03-01 LAB — COMPLETE METABOLIC PANEL WITH GFR
AG Ratio: 0.9 (calc) — ABNORMAL LOW (ref 1.0–2.5)
ALT: 11 U/L (ref 9–46)
AST: 16 U/L (ref 10–35)
Albumin: 3.7 g/dL (ref 3.6–5.1)
Alkaline phosphatase (APISO): 110 U/L (ref 35–144)
BUN: 9 mg/dL (ref 7–25)
CO2: 26 mmol/L (ref 20–32)
Calcium: 8.7 mg/dL (ref 8.6–10.3)
Chloride: 102 mmol/L (ref 98–110)
Creat: 0.83 mg/dL (ref 0.70–1.30)
Globulin: 4.2 g/dL (calc) — ABNORMAL HIGH (ref 1.9–3.7)
Glucose, Bld: 139 mg/dL — ABNORMAL HIGH (ref 65–99)
Potassium: 4.1 mmol/L (ref 3.5–5.3)
Sodium: 138 mmol/L (ref 135–146)
Total Bilirubin: 0.4 mg/dL (ref 0.2–1.2)
Total Protein: 7.9 g/dL (ref 6.1–8.1)
eGFR: 107 mL/min/{1.73_m2} (ref >=60)

## 2022-03-01 NOTE — Progress Notes (Signed)
CBC and CMP are normal.  Glucose is elevated probably not a fasting sample.

## 2022-03-14 ENCOUNTER — Other Ambulatory Visit (HOSPITAL_COMMUNITY): Payer: Self-pay

## 2022-03-20 ENCOUNTER — Other Ambulatory Visit (HOSPITAL_COMMUNITY): Payer: Self-pay

## 2022-04-11 ENCOUNTER — Other Ambulatory Visit (HOSPITAL_COMMUNITY): Payer: Self-pay

## 2022-04-22 ENCOUNTER — Other Ambulatory Visit (HOSPITAL_COMMUNITY): Payer: Self-pay

## 2022-05-07 ENCOUNTER — Other Ambulatory Visit: Payer: Self-pay | Admitting: Rheumatology

## 2022-05-07 ENCOUNTER — Other Ambulatory Visit (HOSPITAL_COMMUNITY): Payer: Self-pay

## 2022-05-07 DIAGNOSIS — M059 Rheumatoid arthritis with rheumatoid factor, unspecified: Secondary | ICD-10-CM

## 2022-05-07 MED ORDER — ENBREL MINI 50 MG/ML ~~LOC~~ SOCT
50.0000 mg | SUBCUTANEOUS | 0 refills | Status: DC
  Filled 2022-05-09: qty 4, 28d supply, fill #0
  Filled 2022-06-06: qty 4, 28d supply, fill #1
  Filled 2022-07-18: qty 4, 28d supply, fill #2

## 2022-05-07 NOTE — Telephone Encounter (Signed)
Next Visit: 07/31/2022  Last Visit: 02/28/2022  Last Fill: 02/12/2022  PE:[id number] rheumatoid arthritis  Current Dose per office note 02/28/2022: Enbrel 50 mg sq injections once weekly  Labs: 02/28/2022 CBC and CMP are normal.  Glucose is elevated probably not a fasting sample.  TB Gold: 07/30/2021 Neg    Okay to refill Enbrel?

## 2022-05-09 ENCOUNTER — Other Ambulatory Visit (HOSPITAL_COMMUNITY): Payer: Self-pay

## 2022-05-20 ENCOUNTER — Other Ambulatory Visit (HOSPITAL_COMMUNITY): Payer: Self-pay

## 2022-05-28 DIAGNOSIS — E663 Overweight: Secondary | ICD-10-CM | POA: Insufficient documentation

## 2022-05-30 ENCOUNTER — Other Ambulatory Visit (HOSPITAL_COMMUNITY): Payer: Self-pay

## 2022-06-06 ENCOUNTER — Other Ambulatory Visit (HOSPITAL_COMMUNITY): Payer: Self-pay

## 2022-06-20 ENCOUNTER — Encounter: Payer: Self-pay | Admitting: Internal Medicine

## 2022-06-20 ENCOUNTER — Ambulatory Visit (INDEPENDENT_AMBULATORY_CARE_PROVIDER_SITE_OTHER): Payer: Medicare Other | Admitting: Internal Medicine

## 2022-06-20 ENCOUNTER — Other Ambulatory Visit: Payer: Self-pay

## 2022-06-20 DIAGNOSIS — A528 Late syphilis, latent: Secondary | ICD-10-CM

## 2022-06-20 DIAGNOSIS — E785 Hyperlipidemia, unspecified: Secondary | ICD-10-CM | POA: Insufficient documentation

## 2022-06-20 DIAGNOSIS — B20 Human immunodeficiency virus [HIV] disease: Secondary | ICD-10-CM | POA: Diagnosis not present

## 2022-06-20 DIAGNOSIS — M059 Rheumatoid arthritis with rheumatoid factor, unspecified: Secondary | ICD-10-CM

## 2022-06-20 DIAGNOSIS — E039 Hypothyroidism, unspecified: Secondary | ICD-10-CM | POA: Insufficient documentation

## 2022-06-20 DIAGNOSIS — D899 Disorder involving the immune mechanism, unspecified: Secondary | ICD-10-CM | POA: Insufficient documentation

## 2022-06-20 DIAGNOSIS — I10 Essential (primary) hypertension: Secondary | ICD-10-CM | POA: Insufficient documentation

## 2022-06-20 MED ORDER — BIKTARVY 50-200-25 MG PO TABS: 1.0000 | ORAL_TABLET | Freq: Every day | ORAL | 11 refills | Status: DC

## 2022-06-20 NOTE — Progress Notes (Signed)
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    Priority: Medium    Disorder involving the immune mechanism, unspecified ([place]) 06/20/2022   Essential hypertension 06/20/2022   Hyperlipidemia 06/20/2022   Hypothyroidism 06/20/2022   Overweight (BMI 25.0-29.9) 05/28/2022   Chronic, continuous use of opioids 02/26/2022   Pain medication agreement 02/05/2022   Anemia 11/09/2018   Elevated liver enzymes 11/09/2018   Neuropathy 10/07/2018   Bilateral lower extremity edema 09/08/2018   Malnutrition of moderate degree 08/18/2018   Unintentional weight loss 08/18/2018   Rheumatoid arthritis ([place]) 08/18/2018   Knee mass, right 08/18/2018   Pancytopenia ([place]) 08/18/2018   Cigarette smoker 08/18/2018   GERD (gastroesophageal reflux disease) 08/18/2018   Psoriasis 08/18/2018   History of MRSA infection 08/18/2018   Neoplasm 06/21/2018   Mass 04/08/2018    Patient's Medications  New Prescriptions   No medications on file  Previous Medications   DICLOFENAC (VOLTAREN) 50 MG EC TABLET    Take 50 mg by mouth 2 (two) times daily.   ETANERCEPT (ENBREL MINI) 50 MG/ML SOCT    Inject 50 mg into the skin once a week.   GABAPENTIN (NEURONTIN) 300 MG CAPSULE    TAKE 1 CAPSULE(300 MG) BY MOUTH THREE TIMES DAILY  Modified Medications   Modified Medication Previous Medication   BICTEGRAVIR-EMTRICITABINE-TENOFOVIR AF (BIKTARVY) 50-200-25 MG TABS TABLET bictegravir-emtricitabine-tenofovir AF (BIKTARVY) 50-200-25 MG TABS tablet      Take 1 tablet by mouth daily.    Take 1 tablet by mouth daily.  Discontinued Medications   SULFAMETHOXAZOLE-TRIMETHOPRIM (BACTRIM DS) 800-160 MG TABLET    TAKE 1 TABLET BY MOUTH DAILY    Subjective: Patrick Huber is in for his routine HIV follow-up visit.  He has not had any problems obtaining, taking or tolerating his Biktarvy.  It is mailed to him each month.  He does not  recall missing doses.  He was found to have seropositive rheumatoid arthritis and was started on Enbrel and diclofenac about 6 months ago.  His chronic joint pain is much improved.  His disability has been approved.  He is not working.  Review of Systems: Review of Systems  Constitutional:  Negative for fever and weight loss.  Musculoskeletal:  Positive for joint pain.  Psychiatric/Behavioral:  Negative for depression.     Past Medical History:  Diagnosis Date   Angina pectoris (HCC)    HIV (human immunodeficiency virus infection) ([place])    Seropositive rheumatoid arthritis ([place])     Social History   Tobacco Use   Smoking status: Every Day    Packs/day: 1.00    Years: 25.00    Total pack years: 25.00    Types: Cigarettes   Smokeless tobacco: Never  Vaping Use   Vaping Use: Never used  Substance Use Topics   Alcohol use: Not Currently   Drug use: Never    Family History  Problem Relation Age of Onset   Heart disease Mother    Cancer Mother    Cancer Father    Heart disease Father    Cancer Sister    Heart disease Brother    Healthy Son    Healthy Daughter     Allergies  Allergen Reactions   Meperidine Anaphylaxis   Meperidine Hcl     Other reaction(s): Unknown    Health Maintenance  Topic Date Due  COVID-19 Vaccine (1) Never done   URINE MICROALBUMIN  Never done   TETANUS/TDAP  Never done   Zoster Vaccines- Shingrix (1 of 2) Never done   COLONOSCOPY (Pts 45-70yr Insurance coverage will need to be confirmed)  Never done   INFLUENZA VACCINE  07/02/2022   Hepatitis C Screening  Completed   HIV Screening  Completed   HPV VACCINES  Aged Out    Objective:  Vitals:   06/20/22 1137  BP: 121/80  Pulse: 80  Resp: 16  SpO2: 97%  Weight: 204 lb (92.5 kg)  Height: '6\' 1"'$  (1.854 m)   Body mass index is 26.91 kg/m.  Physical Exam Constitutional:      Comments: He is in good spirits.  His weight is stable.  Cardiovascular:     Rate and Rhythm: Normal  rate and regular rhythm.     Heart sounds: No murmur heard. Pulmonary:     Effort: Pulmonary effort is normal.     Breath sounds: Normal breath sounds.  Psychiatric:        Mood and Affect: Mood normal.     Lab Results Lab Results  Component Value Date   WBC 5.6 02/28/2022   HGB 13.7 02/28/2022   HCT 42.0 02/28/2022   MCV 86.4 02/28/2022   PLT 275 02/28/2022    Lab Results  Component Value Date   CREATININE 0.83 02/28/2022   BUN 9 02/28/2022   NA 138 02/28/2022   K 4.1 02/28/2022   CL 102 02/28/2022   CO2 26 02/28/2022    Lab Results  Component Value Date   ALT 11 02/28/2022   AST 16 02/28/2022   ALKPHOS 168 (H) 08/17/2018   BILITOT 0.4 02/28/2022    Lab Results  Component Value Date   CHOL 127 07/25/2020   HDL 20 (L) 07/25/2020   LDLCALC 72 07/25/2020   TRIG 266 (H) 07/25/2020   CHOLHDL 6.4 (H) 07/25/2020   Lab Results  Component Value Date   LABRPR REACTIVE (A) 06/12/2021   RPRTITER 1:32 (H) 06/12/2021   HIV 1 RNA Quant  Date Value  06/12/2021 <20 Copies/mL (H)  07/25/2020 <20 Copies/mL  09/02/2019 <20 NOT DETECTED copies/mL   CD4 T Cell Abs (/uL)  Date Value  06/12/2021 241 (L)  07/25/2020 199 (L)  09/02/2019 139 (L)     Problem List Items Addressed This Visit       High   HIV disease ([place]    His infection has been under excellent long-term control since diagnosis several years ago and he has had steady CD4 reconstitution.  He will get repeat lab work today [place].      Relevant Medications   bictegravir-emtricitabine-tenofovir AF (BIKTARVY) 50-200-25 MG TABS tablet   Other Relevant Orders   [facility]   HIV-1 RNA quant-no reflex-bld   CBC   Comprehensive metabolic panel   RPR   Lipid panel     Medium    Late latent syphilis    His RPR had been stalled at 1:32 after treatment for syphilis several years ago.  I will recheck the RPR today.  If it remains 1:32 or higher I  will retreat him for late latent syphilis.      Relevant Medications   bictegravir-emtricitabine-tenofovir AF (BIKTARVY) 50-200-25 MG TABS tablet     Unprioritized   Rheumatoid arthritis ([place]    His arthritis is much improved since starting Enbrel and diclofenac.  Michel Bickers, MD [facility] [phone] pager   [phone] cell 06/20/2022, 12:05 PM

## 2022-06-20 NOTE — Assessment & Plan Note (Signed)
His arthritis is much improved since starting Enbrel and diclofenac.

## 2022-06-20 NOTE — Assessment & Plan Note (Signed)
His infection has been under excellent long-term control since diagnosis several years ago and he has had steady CD4 reconstitution.  He will get repeat lab work today continue [place] and follow-up in 1 year.

## 2022-06-20 NOTE — Assessment & Plan Note (Signed)
His RPR had been stalled at 1:32 after treatment for syphilis several years ago.  I will recheck the RPR today.  If it remains 1:32 or higher I will retreat him for late latent syphilis.

## 2022-06-21 LAB — T-HELPER CELLS (CD4) COUNT (NOT AT ARMC)
CD4 % Helper T Cell: 13 % — ABNORMAL LOW (ref 33–65)
CD4 T Cell Abs: 203 /uL — ABNORMAL LOW (ref 400–1790)

## 2022-06-24 ENCOUNTER — Other Ambulatory Visit (HOSPITAL_COMMUNITY): Payer: Self-pay

## 2022-06-24 ENCOUNTER — Telehealth: Payer: Self-pay

## 2022-06-24 LAB — COMPREHENSIVE METABOLIC PANEL
AG Ratio: 1.1 (calc) (ref 1.0–2.5)
ALT: 14 U/L (ref 9–46)
AST: 10 U/L (ref 10–35)
Albumin: 4.1 g/dL (ref 3.6–5.1)
Alkaline phosphatase (APISO): 103 U/L (ref 35–144)
BUN: 14 mg/dL (ref 7–25)
CO2: 27 mmol/L (ref 20–32)
Calcium: 9 mg/dL (ref 8.6–10.3)
Chloride: 104 mmol/L (ref 98–110)
Creat: 0.97 mg/dL (ref 0.70–1.30)
Globulin: 3.9 g/dL (calc) — ABNORMAL HIGH (ref 1.9–3.7)
Glucose, Bld: 130 mg/dL — ABNORMAL HIGH (ref 65–99)
Potassium: 4 mmol/L (ref 3.5–5.3)
Sodium: 139 mmol/L (ref 135–146)
Total Bilirubin: 0.3 mg/dL (ref 0.2–1.2)
Total Protein: 8 g/dL (ref 6.1–8.1)

## 2022-06-24 LAB — CBC
HCT: 42.7 % (ref 38.5–50.0)
Hemoglobin: 14.4 g/dL (ref 13.2–17.1)
MCH: 30.6 pg (ref 27.0–33.0)
MCHC: 33.7 g/dL (ref 32.0–36.0)
MCV: 90.9 fL (ref 80.0–100.0)
MPV: 9.2 fL (ref 7.5–12.5)
Platelets: 239 10*3/uL (ref 140–400)
RBC: 4.7 10*6/uL (ref 4.20–5.80)
RDW: 15.3 % — ABNORMAL HIGH (ref 11.0–15.0)
WBC: 5.5 10*3/uL (ref 3.8–10.8)

## 2022-06-24 LAB — LIPID PANEL
Cholesterol: 146 mg/dL (ref <200)
HDL: 23 mg/dL — ABNORMAL LOW (ref >=40)
LDL Cholesterol (Calc): 84 mg/dL (calc)
Non-HDL Cholesterol (Calc): 123 mg/dL (calc) (ref <130)
Total CHOL/HDL Ratio: 6.3 (calc) — ABNORMAL HIGH (ref <5.0)
Triglycerides: 324 mg/dL — ABNORMAL HIGH (ref <150)

## 2022-06-24 LAB — HIV-1 RNA QUANT-NO REFLEX-BLD
HIV 1 RNA Quant: NOT DETECTED Copies/mL
HIV-1 RNA Quant, Log: NOT DETECTED Log cps/mL

## 2022-06-24 LAB — RPR: RPR Ser Ql: REACTIVE — AB

## 2022-06-24 LAB — FLUORESCENT TREPONEMAL AB(FTA)-IGG-BLD: Fluorescent Treponemal ABS: REACTIVE — AB

## 2022-06-24 LAB — RPR TITER: RPR Titer: 1:16 {titer} — ABNORMAL HIGH

## 2022-06-24 NOTE — Telephone Encounter (Signed)
Patient Advocate Encounter  Prior Authorization for ENBREL MINI '50MG'$  has been approved.    PA# [id number] Effective dates: 7.24.23 through 12.31.2023  Jyssica Rief B. CPhT P: [phone] F: [phone]

## 2022-06-24 NOTE — Telephone Encounter (Signed)
Patient Advocate Encounter  Received notification from [facility] that prior authorization for ENBREL MINI '50MG'$ /MLis required.   PA submitted on 7.24.23 Key [id number] Status is pending    Luciano Cutter, CPhT Patient Advocate Phone: [phone]

## 2022-06-24 NOTE — Telephone Encounter (Signed)
Received notification from [facility] that pt's current PA is expiring.

## 2022-07-03 ENCOUNTER — Other Ambulatory Visit: Payer: Self-pay | Admitting: Internal Medicine

## 2022-07-03 DIAGNOSIS — G629 Polyneuropathy, unspecified: Secondary | ICD-10-CM

## 2022-07-03 MED ORDER — GABAPENTIN 300 MG PO CAPS: ORAL_CAPSULE | ORAL | 11 refills | Status: DC

## 2022-07-03 NOTE — Telephone Encounter (Signed)
Please advise, thanks.

## 2022-07-17 NOTE — Progress Notes (Unsigned)
Office Visit Note  Patient: Patrick Huber             Date of Birth: 1971-07-12           MRN: [id number]             PCP: Michel Bickers, MD Referring: Michel Bickers, MD Visit Date: 07/31/2022 Occupation: _0 @  Subjective:  Discuss medication options   History of Present Illness: Patrick Huber is a 51 y.o. male with history of seropositive rheumatoid arthritis and osteoarthritis. He remains on enbrel 50 mg sq injections once weekly. He was initially started on enbrel on 08/15/21.  He has been tolerating Enbrel without any side effects or injection site reactions.  He denies missing any doses of Enbrel recently.  He states that he typically injects Enbrel on Sundays and by Thursday he is experiencing increased joint pain, stiffness, and intermittent swelling.  His symptoms have been most severe in both hands, both hips, both knees, both ankle joints.  He has noticed swelling in both knee joints in both ankle joints leading up to his weekly Enbrel injections.  His morning stiffness has been lasting about 30 minutes daily.  He has been experiencing nocturnal pain on a nightly basis. He denies any recent or recurrent infections.  He states he is up-to-date with his pneumonia vaccine.  He does not plan on getting annual flu shot this year.    Activities of Daily Living:  Patient reports morning stiffness for 30 minutes.   Patient Reports nocturnal pain.  Difficulty dressing/grooming: Denies Difficulty climbing stairs: Reports Difficulty getting out of chair: Reports Difficulty using hands for taps, buttons, cutlery, and/or writing: Reports  Review of Systems  Constitutional:  Positive for fatigue.  HENT:  Negative for mouth sores and mouth dryness.   Eyes:  Negative for dryness.  Respiratory:  Positive for shortness of breath.   Cardiovascular:  Negative for chest pain and palpitations.  Gastrointestinal:  Negative for blood in stool, constipation and diarrhea.  Endocrine:  Negative for increased urination.  Genitourinary:  Negative for involuntary urination.  Musculoskeletal:  Positive for joint pain, joint pain, joint swelling, myalgias, morning stiffness, muscle tenderness and myalgias. Negative for gait problem and muscle weakness.  Skin:  Positive for sensitivity to sunlight. Negative for color change, rash and hair loss.  Allergic/Immunologic: Negative for susceptible to infections.  Neurological:  Negative for dizziness and headaches.  Hematological:  Negative for swollen glands.  Psychiatric/Behavioral:  Positive for sleep disturbance. Negative for depressed mood. The patient is not nervous/anxious.     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   Malnutrition of moderate degree 08/18/2018   HIV disease ([place]) 08/18/2018   Unintentional weight loss 08/18/2018   Rheumatoid arthritis ([place]) 08/18/2018   Knee mass, right 08/18/2018   Pancytopenia ([place]) 08/18/2018   Cigarette smoker 08/18/2018   GERD (gastroesophageal reflux disease) 08/18/2018   Psoriasis 08/18/2018   History of MRSA infection 08/18/2018   Neoplasm 06/21/2018   Mass 04/08/2018    Past Medical History:  Diagnosis Date  Angina pectoris (HCC)    HIV (human immunodeficiency virus infection) ([place])    Seropositive rheumatoid arthritis ([place])     Family History  Problem Relation Age of [place]   Heart disease Mother    Cancer Mother    Cancer Father    Heart disease Father    Cancer Sister    Heart disease  Brother    Healthy Son    Healthy Daughter    Past Surgical History:  Procedure Laterality Date   KNEE ARTHROSCOPY Left    Right knee surgery     [facility] - [facility]   Social History   Social History Narrative   Not on file   Immunization History  Administered Date(s) Administered   Influenza,inj,Quad PF,6+ Mos 09/02/2019     Objective: Vital Signs: BP 135/83 (BP Location: Left Arm, Patient Position: Sitting, Cuff Size: Normal)   Pulse 77   Resp 19   Ht 6' 1" (1.854 m)   Wt 211 lb 3.2 oz (95.8 kg)   BMI 27.86 kg/m    Physical Exam Vitals and nursing note reviewed.  Constitutional:      Appearance: He is well-developed.  HENT:     Head: Normocephalic and atraumatic.  Eyes:     Conjunctiva/sclera: Conjunctivae normal.     Pupils: Pupils are equal, round, and reactive to light.  Cardiovascular:     Rate and Rhythm: Normal rate and regular rhythm.     Heart sounds: Normal heart sounds.  Pulmonary:     Effort: Pulmonary effort is normal.     Breath sounds: Normal breath sounds.  Abdominal:     General: Bowel sounds are normal.     Palpations: Abdomen is soft.  Musculoskeletal:     Cervical back: Normal range of motion and neck supple.  Skin:    General: Skin is warm and dry.     Capillary Refill: Capillary refill takes less than 2 seconds.  Neurological:     Mental Status: He is alert and oriented to person, place, and time.  Psychiatric:        Behavior: Behavior normal.      Musculoskeletal Exam: C-spine have limited ROM with lateral rotation.  Shoulder joints have stiffness and discomfort with ROM.  Bilateral elbow joint flexion contractures.  PIP and DIP thickening consistent with osteoarthritis of both hands.  Wrist joints have good ROM with no tenderness or joint swelling.  Hip joints have painful and limited ROM, left more limited than right.  Painful ROM of both knee joints.  Warmth and swelling of the left knee.  Right ankle joint has painful ROM with  tenderness and joint swelling.   CDAI Exam: CDAI Score: 10.2  Patient Global: 6 mm; Provider Global: 6 mm Swollen: 4 ; Tender: 8  Joint Exam 07/31/2022      Right  Left  Glenohumeral   Tender   Tender  Elbow  Swollen Tender  Swollen Tender  Knee   Tender  Swollen Tender  Ankle  Swollen Tender   Tender     Investigation: No additional findings.  Imaging: No results found.  Recent Labs: Lab Results  Component Value Date   WBC 5.5 06/20/2022   HGB 14.4 06/20/2022   PLT 239 06/20/2022   NA 139 06/20/2022   K 4.0 06/20/2022   CL 104 06/20/2022   CO2 27 06/20/2022   GLUCOSE 130 (H) 06/20/2022   BUN 14 06/20/2022   CREATININE 0.97 06/20/2022   BILITOT 0.3 06/20/2022  ALKPHOS 168 (H) 08/17/2018   AST 10 06/20/2022   ALT 14 06/20/2022   PROT 8.0 06/20/2022   ALBUMIN 2.2 (L) 08/17/2018   CALCIUM 9.0 06/20/2022   GFRAA 129 12/28/2018   QFTBGOLDPLUS NEGATIVE 07/30/2021    Speciality Comments: No specialty comments available.  Procedures:  No procedures performed Allergies: Meperidine and Meperidine hcl   Assessment / Plan:     Visit Diagnoses: Seropositive rheumatoid arthritis ([place]) - Anti-CCP>250, RF 20, ESR 72, Bilateral knee swelling, bilateral elbow joint contractures, +synovitis: Patient presents today with increased joint pain, stiffness, and intermittent formation of on multiple joints.  He currently has warmth and swelling in the left knee, right ankle, and limited and painful range of motion of both hip joints.  His morning stiffness has been lasting 30 minutes daily and he continues to experience nocturnal pain on a nightly basis.  He has been taking diclofenac 50 mg 1 tablet twice daily for pain relief.  He is currently on Enbrel 50 mg sq injections every week.  Patient typically injects Enbrel on Sundays and by Thursday he is experiencing increased joint pain, stiffness, and swelling involving multiple joints.  He has been on Enbrel since 08/15/2021.  He has not  tried any other biologic agents in the past.  Indications, contraindications, potential side effects of Humira were discussed today in detail.  All questions were addressed and consent was obtained.  We will apply for Humira through his insurance and once approved he will return for the administration of the first injection in the office.  CBC and CMP will be updated today.  His next lab work will be due in 1 month and every 3 months to monitor for drug toxicity.  He will follow-up in the office in 8 weeks or sooner if needed.  Counseled patient that Humira is a TNF blocking agent.  Counseled patient on purpose, proper use, and adverse effects of Humira.  Reviewed the most common adverse effects including infections, headache, and injection site reactions. Discussed that there is the possibility of an increased risk of malignancy including non-melanoma skin cancer but it is not well understood if this increased risk is due to the medication or the disease state.  Advised patient to get yearly dermatology exams due to risk of skin cancer. Counseled patient that Humira should be held prior to scheduled surgery.  Counseled patient to avoid live vaccines while on Humira.  Recommend annual influenza, PCV 15 or PCV20 or Pneumovax 23, and Shingrix as indicated.  Reviewed the importance of regular labs while on Humira therapy.  Will monitor CBC and CMP 1 month after starting and then every 3 months routinely thereafter. Will monitor TB gold annually. Standing orders placed.    Provided patient with medication education material and answered all questions.  Patient consented to Humira.  Will upload consent into the media tab.  Reviewed storage instructions of Humira.  Advised initial injection must be administered in office.  Patient verbalized understanding.   Dose will be for rheumatoid arthritis Humira 40 mg every 14 days.  Prescription pending lab results and/or insurance approval.  High risk medication use  -Applying for Humira 40 mg sq injections every 14 days.  Inadequate response to Enbrel 50 mg sq injections once weekly. Enbrel started 08/15/21.  - Plan: QuantiFERON-TB Gold Plus, COMPLETE METABOLIC PANEL WITH GFR, CBC with Differential/Platelet CBC and CMP updated on 06/20/22.  Orders for CBC and CMP were released today.  His next lab work will be due in  1 month and every 3 months to monitor for drug toxicity.  Standing orders for CBC and CMP remain in place. TB gold negative on 07/30/21.  Order for TB gold released today.  Has not had any recent or recurrent infections.  Discussed the importance of holding humira if he develops signs or symptoms of an infection and to resume once the infection has completely cleared.  Up-to-date on pneumonia vaccine.  Discussed the Shingrix vaccine.  Patient does not plan on having annual flu shot this year Discussed the importance of yearly skin exams while on humira due to the increased risk for skin cancer.  Screening for tuberculosis -Order for TB gold released today.  Plan: QuantiFERON-TB Gold Plus  Psoriasis: Well controlled on Enbrel.  Patient will be switching to humira as discussed above.   Primary osteoarthritis of both hands - X-rays of both hands were consistent with osteoarthritis on 07/30/2021.  He has PIP and DIP thickening consistent with osteoarthritis of both hands.  He has been experiencing intermittent pain and stiffness in both hands recently.  Contracture of joint of both elbows - X-rays of both elbows were obtained on 07/30/2021 which were consistent with inflammatory arthritis.  He continues to have tenderness, thickening, and inflammation in both elbow joints.  Flexion contractures noted bilaterally.  Primary osteoarthritis of both knees - X-rays of both knees were obtained on 07/30/2021 which were consistent with mild osteoarthritis and mild chondromalacia patella.  He has painful ROM of both both knee joints.  Warmth and swelling of the left  knee noted on examination today.  Primary osteoarthritis of both feet - X-rays of both feet were consistent with osteoarthritic changes on 07/30/2021.  He has been experiencing increased pain in both ankle joints.  He has noticed intermittent swelling in his right ankle.  Other medical conditions are listed as follows:  Late latent syphilis  Neuropathy  HIV disease ([place]) - Dr. Megan Salon.  In remission.  History of MRSA infection - Resolved.  Cigarette smoker - 1 ppd x25 yr.    Orders: Orders Placed This Encounter  Procedures   QuantiFERON-TB Gold Plus   COMPLETE METABOLIC PANEL WITH GFR   CBC with Differential/Platelet   No orders of the defined types were placed in this encounter.    Follow-Up Instructions: Return in about 8 weeks (around 09/25/2022) for Rheumatoid arthritis, Osteoarthritis.   Ofilia Neas, PA-C  Note - This record has been created using Dragon software.  Chart creation errors have been sought, but may not always  have been located. Such creation errors do not reflect on  the standard of medical care.

## 2022-07-18 ENCOUNTER — Other Ambulatory Visit (HOSPITAL_COMMUNITY): Payer: Self-pay

## 2022-07-22 ENCOUNTER — Other Ambulatory Visit (HOSPITAL_COMMUNITY): Payer: Self-pay

## 2022-07-31 ENCOUNTER — Telehealth: Payer: Self-pay | Admitting: Pharmacist

## 2022-07-31 ENCOUNTER — Ambulatory Visit: Payer: Medicare Other | Attending: Physician Assistant | Admitting: Physician Assistant

## 2022-07-31 ENCOUNTER — Encounter: Payer: Self-pay | Admitting: Physician Assistant

## 2022-07-31 VITALS — BP 135/83 | HR 77 | Resp 19 | Ht 73.0 in | Wt 211.2 lb

## 2022-07-31 DIAGNOSIS — M19041 Primary osteoarthritis, right hand: Secondary | ICD-10-CM | POA: Diagnosis not present

## 2022-07-31 DIAGNOSIS — M19042 Primary osteoarthritis, left hand: Secondary | ICD-10-CM

## 2022-07-31 DIAGNOSIS — Z79899 Other long term (current) drug therapy: Secondary | ICD-10-CM

## 2022-07-31 DIAGNOSIS — A528 Late syphilis, latent: Secondary | ICD-10-CM

## 2022-07-31 DIAGNOSIS — M059 Rheumatoid arthritis with rheumatoid factor, unspecified: Secondary | ICD-10-CM

## 2022-07-31 DIAGNOSIS — L409 Psoriasis, unspecified: Secondary | ICD-10-CM

## 2022-07-31 DIAGNOSIS — M19072 Primary osteoarthritis, left ankle and foot: Secondary | ICD-10-CM

## 2022-07-31 DIAGNOSIS — G629 Polyneuropathy, unspecified: Secondary | ICD-10-CM

## 2022-07-31 DIAGNOSIS — B20 Human immunodeficiency virus [HIV] disease: Secondary | ICD-10-CM

## 2022-07-31 DIAGNOSIS — M170 Bilateral primary osteoarthritis of knee: Secondary | ICD-10-CM

## 2022-07-31 DIAGNOSIS — Z111 Encounter for screening for respiratory tuberculosis: Secondary | ICD-10-CM

## 2022-07-31 DIAGNOSIS — Z8614 Personal history of Methicillin resistant Staphylococcus aureus infection: Secondary | ICD-10-CM

## 2022-07-31 DIAGNOSIS — M19071 Primary osteoarthritis, right ankle and foot: Secondary | ICD-10-CM

## 2022-07-31 DIAGNOSIS — M24521 Contracture, right elbow: Secondary | ICD-10-CM

## 2022-07-31 DIAGNOSIS — M24522 Contracture, left elbow: Secondary | ICD-10-CM

## 2022-07-31 DIAGNOSIS — F17210 Nicotine dependence, cigarettes, uncomplicated: Secondary | ICD-10-CM

## 2022-07-31 NOTE — Patient Instructions (Signed)
Standing Labs We placed an order today for your standing lab work.   Please have your standing labs drawn in 1 month after starting humira and then every 3 months.   If possible, please have your labs drawn 2 weeks prior to your appointment so that the provider can discuss your results at your appointment.  Please note that you may see your imaging and lab results in [place] before we have reviewed them. We may be awaiting multiple results to interpret others before contacting you. Please allow our office up to 72 hours to thoroughly review all of the results before contacting the office for clarification of your results.  We currently have open lab daily: Monday through Thursday from 1:30 PM-4:30 PM and Friday from 1:30 PM- 4:00 PM If possible, please come for your lab work on Monday, Thursday or Friday afternoons, as you may experience shorter wait times.   Effective October 02, 2022 the new lab hours will change to: Monday through Thursday from 1:30 PM-5:00 PM and Friday from 8:30 AM-12:00 PM If possible, please come for your lab work on Monday and Thursday afternoons, as you may experience shorter wait times.  Please be advised, all patients with office appointments requiring lab work will take precedent over walk-in lab work.    The office is located at [address], [place], [place], [place] [address] No appointment is necessary.   Labs are drawn by [facility]. Please bring your co-pay at the time of your lab draw.  You may receive a bill from [facility] for your lab work.  Please note if you are on Hydroxychloroquine and and an order has been placed for a Hydroxychloroquine level, you will need to have it drawn 4 hours or more after your last dose.  If you wish to have your labs drawn at another location, please call the office 24 hours in advance to send orders.  If you have any questions regarding directions or hours of operation,  please call [phone].   As a reminder, please  drink plenty of water prior to coming for your lab work. Thanks!   Adalimumab Injection What is this medication? ADALIMUMAB (ay da LIM yoo mab) treats autoimmune conditions, such as psoriasis, arthritis, Crohn's disease, and ulcerative colitis. It works by slowing down an overactive immune system. It belongs to a group of medications called TNF inhibitors. It is a monoclonal antibody. This medicine may be used for other purposes; ask your health care provider or pharmacist if you have questions. COMMON BRAND NAME(S): AMJEVITA, Humira What should I tell my care team before I take this medication? They need to know if you have any of these conditions: Cancer Diabetes (high blood sugar) Having surgery Heart disease Hepatitis B Immune system problems Infections, such as tuberculosis (TB) or other bacterial, fungal, or viral infections Multiple sclerosis Recent or upcoming vaccine An unusual or allergic reaction to adalimumab, mannitol, latex, rubber, other medications, foods, dyes, or preservatives Pregnant or trying to get pregnant Breast-feeding How should I use this medication? This medication is injected under the skin. It may be given by your care team in a hospital or clinic setting. It may also be given at home. If you get this medication at home, you will be taught how to prepare and give it. Use exactly as directed. Take it as directed on the prescription label. Keep taking it unless your care team tells you to stop. This medication comes with INSTRUCTIONS FOR USE. Ask your pharmacist for directions on  how to use this medication. Read the information carefully. Talk to your pharmacist or care team if you have questions. It is important that you put your used needles and syringes in a special sharps container. Do not put them in a trash can. If you do not have a sharps container, call your pharmacist or care team to get one. A special MedGuide will be given to you by the pharmacist with  each prescription and refill. If you are getting this medication in a hospital or clinic, a special MedGuide will be given to you before each treatment. Be sure to read this information carefully each time. Talk to your care team about the use of this medication in children. While it be prescribed for children as young as 2 years for selected conditions, precautions do apply. Overdosage: If you think you have taken too much of this medicine contact a poison control center or emergency room at once. NOTE: This medicine is only for you. Do not share this medicine with others. What if I miss a dose? If you get this medication at the hospital or clinic: it is important not to miss your dose. Call your care team if you are unable to keep an appointment. If you give yourself this medication at home: If you miss a dose, take it as soon as you can. If it is almost time for your next dose, take only that dose. Do not take double or extra doses. Call your care team with questions. What may interact with this medication? Do not take this medication with any of the following: Abatacept [place] Biologic medications, such as certolizumab, etanercept, golimumab, infliximab Live virus vaccines This medication may also interact with the following: Cyclosporine Theophylline Vaccines Warfarin This list may not describe all possible interactions. Give your health care provider a list of all the medicines, herbs, non-prescription drugs, or dietary supplements you use. Also tell them if you smoke, drink alcohol, or use illegal drugs. Some items may interact with your medicine. What should I watch for while using this medication? Visit your care team for regular checks on your progress. Tell your care team if your symptoms do not start to get better or if they get worse. You will be tested for tuberculosis (TB) before you start this medication. If your care team prescribes any medication for TB, you should start  taking the TB medication before starting this medication. Make sure to finish the full course of TB medication. This medication may increase your risk of getting an infection. Call your care team for advice if you get a fever, chills, sore throat, or other symptoms of a cold or flu. Do not treat yourself. Try to avoid being around people who are sick. Talk to your care team about your risk of cancer. You may be more at risk for certain types of cancer if you take this medication. What side effects may I notice from receiving this medication? Side effects that you should report to your care team as soon as possible: Allergic reactions--skin rash, itching, hives, swelling of the face, lips, tongue, or throat Aplastic anemia--unusual weakness or fatigue, dizziness, headache, trouble breathing, increased bleeding or bruising Body pain, tingling, or numbness Heart failure--shortness of breath, swelling of the ankles, feet, or hands, sudden weight gain, unusual weakness or fatigue Infection--fever, chills, cough, sore throat, wounds that don't heal, pain or trouble when passing urine, general feeling of discomfort or being unwell Lupus-like syndrome--joint pain, swelling, or stiffness, butterfly-shaped rash on the  face, rashes that get worse in the sun, fever, unusual weakness or fatigue Unusual bruising or bleeding Side effects that usually do not require medical attention (report to your care team if they continue or are bothersome): Headache Nausea Pain, redness, or irritation at injection site Runny or stuffy nose Sore throat Stomach pain This list may not describe all possible side effects. Call your doctor for medical advice about side effects. You may report side effects to FDA at 1-800-FDA-1088. Where should I keep my medication? Keep out of the reach of children and pets. Store in the refrigerator. Do not freeze. Keep this medication in the original packaging until you are ready to take it.  Protect from light. Get rid of any unused medication after the expiration date. This medication may be stored at room temperature for up to 14 days. Keep this medication in the original packaging. Protect from light. If it is stored at room temperature, get rid of any unused medication after 14 days or after it expires, whichever is first. To get rid of medications that are no longer needed or have expired: Take the medication to a medication take-back program. Check with your pharmacy or law enforcement to find a location. If you cannot return the medication, ask your pharmacist or care team how to get rid of this medication safely. NOTE: This sheet is a summary. It may not cover all possible information. If you have questions about this medicine, talk to your doctor, pharmacist, or health care provider.  2023 Elsevier/Gold Standard (2022-01-31 00:00:00)

## 2022-07-31 NOTE — Telephone Encounter (Addendum)
Please start Humira BIV.  Dose: '40mg'$  SQ every 14 days  Dx: Rheumatoid arthritis (M05.9)  Previously tried therapies: Enbrel - waning clinical response  ----- Message from [place] sent at 07/31/2022  1:26 PM EDT ----- Please apply for humira, per Hazel Sams, PA-C. Thanks!   Consent obtained and sent to the scan center.

## 2022-08-01 ENCOUNTER — Other Ambulatory Visit (HOSPITAL_COMMUNITY): Payer: Self-pay

## 2022-08-01 MED ORDER — HUMIRA (2 PEN) 40 MG/0.4ML ~~LOC~~ AJKT
40.0000 mg | AUTO-INJECTOR | SUBCUTANEOUS | 0 refills | Status: DC
  Filled 2022-08-01 – 2022-08-08 (×2): qty 2, 28d supply, fill #0

## 2022-08-01 NOTE — Telephone Encounter (Signed)
Submitted a Prior Authorization request to [facility] for HUMIRA via CoverMyMeds. Will update once we receive a response.  Key: [id number]  Knox Saliva, PharmD, MPH, BCPS, CPP Clinical Pharmacist (Rheumatology and Pulmonology)

## 2022-08-01 NOTE — Telephone Encounter (Signed)
Received notification from [facility] regarding a prior authorization for [place]. Authorization has been APPROVED from 08/01/22 to 01/30/23.   Per test claim, copay for 28 days supply is $0  Patient can fill through [facility]: [phone]   Authorization #  [id number]  Patient will plan to take Enbrel this upcoming Sunday, and hold on 08/11/22. Humira new start scheduled for 08/02/22   Routing to [place] for onboarding needs  Knox Saliva, PharmD, MPH, BCPS, CPP Clinical Pharmacist (Rheumatology and Pulmonology)

## 2022-08-02 NOTE — Progress Notes (Signed)
Pharmacy Note  Subjective:   Patient presents to clinic today to receive first dose of Humira for rheumatoid arthritis. Patient previously on Enbrel as monotherapy but has had waning clinical response. He held Enbrel dose that was due yesterday, 08/11/22. Patient states that after he takes Enbrel on Sundays, he is already in pain by Wednesdays.  Patient running a fever or have signs/symptoms of infection? No  Patient currently on antibiotics for the treatment of infection? No  Patient have any upcoming invasive procedures/surgeries? No  Objective: CMP     Component Value Date/Time   NA 139 07/31/2022 1320   K 3.9 07/31/2022 1320   CL 104 07/31/2022 1320   CO2 28 07/31/2022 1320   GLUCOSE 93 07/31/2022 1320   BUN 9 07/31/2022 1320   CREATININE 0.94 07/31/2022 1320   CALCIUM 8.4 (L) 07/31/2022 1320   PROT 7.9 07/31/2022 1320   ALBUMIN 2.2 (L) 08/17/2018 1527   AST 30 07/31/2022 1320   ALT 33 07/31/2022 1320   ALKPHOS 168 (H) 08/17/2018 1527   BILITOT 0.4 07/31/2022 1320   GFRNONAA 112 12/28/2018 1510   GFRAA 129 12/28/2018 1510   CBC    Component Value Date/Time   WBC 4.4 07/31/2022 1320   RBC 4.34 07/31/2022 1320   HGB 13.4 07/31/2022 1320   HCT 39.6 07/31/2022 1320   HCT 25.1 (L) 08/22/2018 0750   PLT 188 07/31/2022 1320   MCV 91.2 07/31/2022 1320   MCH 30.9 07/31/2022 1320   MCHC 33.8 07/31/2022 1320   RDW 14.9 07/31/2022 1320   LYMPHSABS 2,050 07/31/2022 1320   MONOABS 0.2 08/17/2018 1527   EOSABS 40 07/31/2022 1320   BASOSABS 22 07/31/2022 1320    Baseline Immunosuppressant Therapy Labs TB GOLD    Latest Ref Rng & Units 07/31/2022    1:20 PM  Quantiferon TB Gold  Quantiferon TB Gold Plus NEGATIVE NEGATIVE    Hepatitis Panel    Latest Ref Rng & Units 07/30/2021    8:56 AM  Hepatitis  Hep B Surface Ag NON-REACTIVE NON-REACTIVE   Hep B IgM NON-REACTIVE NON-REACTIVE   Hep C Ab NON-REACTIVE NON-REACTIVE    HIV Lab Results  Component Value Date   HIV  REPEATEDLY REACTIVE (A) 12/28/2018   HIV Reactive (A) 08/17/2018   Immunoglobulins    Latest Ref Rng & Units 07/30/2021    8:56 AM  Immunoglobulin Electrophoresis  IgA  47 - 310 mg/dL 290   IgG 600 - 1,640 mg/dL 2,381   IgM 50 - 300 mg/dL 114    SPEP    Latest Ref Rng & Units 07/31/2022    1:20 PM  Serum Protein Electrophoresis  Total Protein 6.1 - 8.1 g/dL 7.9    G6PD Lab Results  Component Value Date   G6PDH 18.5 07/30/2021   TPMT Lab Results  Component Value Date   TPMT 18 07/30/2021     Chest x-ray: 08/19/2018 - No active disease  Assessment/Plan:  Demonstrated proper injection technique with Humira demo device  Patient able to demonstrate proper injection technique using the teach back method.  Patient self injected in the right abdomen with:  [facility]-Supplied Medication: Humira '40mg'$ /0.6m autoinjector pen NDC: 016109-6045-40Lot: [id number]: 10/2023  Patient tolerated well.  Observed for 30 mins in office for adverse reaction. Patient denies any irritation or itchiness. Provider notes no swelling or redness at injection site.   Patient is to return in 1 month for labs and 6-8 weeks for follow-up appointment.  Standing orders for  CBC w diff and CMP w GFR placed today.   Referral to [facility] and [place] placed today for patient to have yearly skin checks completed while on TNF inhibitor due to risk for non-melanoma skin cancer. Patient also has history of psoriasis which has not been managed by dermatologist.  Humira approved through insurance .   Rx sent to: [facility]: [phone] .  Patient provided with pharmacy phone number. Advised that they will reach out to schedule subsequent refills directly to his home. He was sent home with one additional Humira pen today that will be due on 08/26/22  Patient will continue Humira '40mg'$  SQ every 14 days as monotherapy  All questions encouraged and answered.   Instructed patient to call with any further questions or concerns.  Knox Saliva, PharmD, MPH, BCPS, CPP Clinical Pharmacist (Rheumatology and Pulmonology)  08/12/2022 8:37 AM

## 2022-08-04 LAB — COMPLETE METABOLIC PANEL WITH GFR
AG Ratio: 1.1 (calc) (ref 1.0–2.5)
ALT: 33 U/L (ref 9–46)
AST: 30 U/L (ref 10–35)
Albumin: 4.2 g/dL (ref 3.6–5.1)
Alkaline phosphatase (APISO): 117 U/L (ref 35–144)
BUN: 9 mg/dL (ref 7–25)
CO2: 28 mmol/L (ref 20–32)
Calcium: 8.4 mg/dL — ABNORMAL LOW (ref 8.6–10.3)
Chloride: 104 mmol/L (ref 98–110)
Creat: 0.94 mg/dL (ref 0.70–1.30)
Globulin: 3.7 g/dL (calc) (ref 1.9–3.7)
Glucose, Bld: 93 mg/dL (ref 65–99)
Potassium: 3.9 mmol/L (ref 3.5–5.3)
Sodium: 139 mmol/L (ref 135–146)
Total Bilirubin: 0.4 mg/dL (ref 0.2–1.2)
Total Protein: 7.9 g/dL (ref 6.1–8.1)
eGFR: 99 mL/min/{1.73_m2} (ref >=60)

## 2022-08-04 LAB — CBC WITH DIFFERENTIAL/PLATELET
Absolute Monocytes: 383 cells/uL (ref 200–950)
Basophils Absolute: 22 cells/uL (ref 0–200)
Basophils Relative: 0.5 %
Eosinophils Absolute: 40 cells/uL (ref 15–500)
Eosinophils Relative: 0.9 %
HCT: 39.6 % (ref 38.5–50.0)
Hemoglobin: 13.4 g/dL (ref 13.2–17.1)
Lymphs Abs: 2050 cells/uL (ref 850–3900)
MCH: 30.9 pg (ref 27.0–33.0)
MCHC: 33.8 g/dL (ref 32.0–36.0)
MCV: 91.2 fL (ref 80.0–100.0)
MPV: 9.2 fL (ref 7.5–12.5)
Monocytes Relative: 8.7 %
Neutro Abs: 1905 cells/uL (ref 1500–7800)
Neutrophils Relative %: 43.3 %
Platelets: 188 10*3/uL (ref 140–400)
RBC: 4.34 10*6/uL (ref 4.20–5.80)
RDW: 14.9 % (ref 11.0–15.0)
Total Lymphocyte: 46.6 %
WBC: 4.4 10*3/uL (ref 3.8–10.8)

## 2022-08-04 LAB — QUANTIFERON-TB GOLD PLUS
Mitogen-NIL: 5.47 IU/mL
NIL: 0.03 IU/mL
QuantiFERON-TB Gold Plus: NEGATIVE
TB1-NIL: 0.01 IU/mL
TB2-NIL: 0.01 IU/mL

## 2022-08-06 NOTE — Progress Notes (Signed)
TB gold negative

## 2022-08-07 ENCOUNTER — Other Ambulatory Visit (HOSPITAL_COMMUNITY): Payer: Self-pay

## 2022-08-08 ENCOUNTER — Other Ambulatory Visit (HOSPITAL_COMMUNITY): Payer: Self-pay

## 2022-08-09 ENCOUNTER — Other Ambulatory Visit (HOSPITAL_COMMUNITY): Payer: Self-pay

## 2022-08-12 ENCOUNTER — Other Ambulatory Visit (HOSPITAL_COMMUNITY): Payer: Self-pay

## 2022-08-12 ENCOUNTER — Ambulatory Visit: Payer: Medicare Other | Attending: Rheumatology | Admitting: Pharmacist

## 2022-08-12 DIAGNOSIS — Z79899 Other long term (current) drug therapy: Secondary | ICD-10-CM

## 2022-08-12 DIAGNOSIS — M059 Rheumatoid arthritis with rheumatoid factor, unspecified: Secondary | ICD-10-CM

## 2022-08-12 DIAGNOSIS — Z7189 Other specified counseling: Secondary | ICD-10-CM

## 2022-08-12 MED ORDER — HUMIRA (2 PEN) 40 MG/0.4ML ~~LOC~~ AJKT
40.0000 mg | AUTO-INJECTOR | SUBCUTANEOUS | 0 refills | Status: DC
  Filled 2022-08-12: qty 6, 84d supply, fill #0
  Filled 2022-09-03: qty 2, 28d supply, fill #0
  Filled 2022-09-26: qty 2, 28d supply, fill #1
  Filled 2022-10-31 – 2022-11-13 (×4): qty 2, 28d supply, fill #2

## 2022-08-12 NOTE — Patient Instructions (Signed)
Your next HUMIRA dose is due on 08/26/22, 09/09/22, and every 14 days thereafter  DISCARD your Enbrel supply if you have any  HOLD HUMIRA if you have signs or symptoms of an infection. You can resume once you feel better or back to your baseline. HOLD HUMIRA if you start antibiotics to treat an infection. HOLD HUMIRA around the time of surgery/procedures. Your surgeon will be able to provide recommendations on when to hold BEFORE and when you are cleared to [place].  Pharmacy information: Your prescription will be shipped from [facility]. Their phone number is [phone] They will call you to schedule shipment. They will mail your medication to your home.  Labs are due in 1 month then every 3 months. Lab hours are from Monday to Thursday 1:30-4:30pm and Friday 1:30-4pm. You do not need an appointment if you come for labs during these times.  How to manage an injection site reaction: Remember the 5 C's: COUNTER - leave on the counter at least 30 minutes but up to overnight to bring medication to room temperature. This may help prevent stinging COLD - place something cold (like an ice gel pack or cold water bottle) on the injection site just before cleansing with alcohol. This may help reduce pain CLARITIN - use Claritin (generic name is loratadine) for the first two weeks of treatment or the day of, the day before, and the day after injecting. This will help to minimize injection site reactions CORTISONE CREAM - apply if injection site is irritated and itching CALL ME - if injection site reaction is bigger than the size of your fist, looks infected, blisters, or if you develop hives

## 2022-08-12 NOTE — Telephone Encounter (Signed)
Med received from [facility]. Placed in refrigerator for new start visit  Knox Saliva, PharmD, MPH, BCPS, CPP Clinical Pharmacist (Rheumatology and Pulmonology)

## 2022-09-03 ENCOUNTER — Other Ambulatory Visit (HOSPITAL_COMMUNITY): Payer: Self-pay

## 2022-09-04 ENCOUNTER — Other Ambulatory Visit (HOSPITAL_COMMUNITY): Payer: Self-pay

## 2022-09-05 ENCOUNTER — Other Ambulatory Visit (HOSPITAL_COMMUNITY): Payer: Self-pay

## 2022-09-12 NOTE — Progress Notes (Deleted)
Office Visit Note  Patient: Patrick Huber             Date of Birth: 03-Nov-1971           MRN: [id number]             PCP: Michel Bickers, MD Referring: Michel Bickers, MD Visit Date: 09/25/2022 Occupation: '@GUAROCC'$ @  Subjective:  No chief complaint on file.   History of Present Illness: Patrick Huber is a 51 y.o. male ***   Activities of Daily Living:  Patient reports morning stiffness for *** {minute/hour:19697}.   Patient {ACTIONS;DENIES/REPORTS:21021675::"Denies"} nocturnal pain.  Difficulty dressing/grooming: {ACTIONS;DENIES/REPORTS:21021675::"Denies"} Difficulty climbing stairs: {ACTIONS;DENIES/REPORTS:21021675::"Denies"} Difficulty getting out of chair: {ACTIONS;DENIES/REPORTS:21021675::"Denies"} Difficulty using hands for taps, buttons, cutlery, and/or writing: {ACTIONS;DENIES/REPORTS:21021675::"Denies"}  No Rheumatology ROS completed.   PMFS History:  Patient Active Problem List   Diagnosis Date Noted  . Disorder involving the immune mechanism, unspecified ([place]) 06/20/2022  . Essential hypertension 06/20/2022  . Hyperlipidemia 06/20/2022  . Hypothyroidism 06/20/2022  . Overweight (BMI 25.0-29.9) 05/28/2022  . Chronic, continuous use of opioids 02/26/2022  . Pain medication agreement 02/05/2022  . Anemia 11/09/2018  . Elevated liver enzymes 11/09/2018  . Neuropathy 10/07/2018  . Bilateral lower extremity edema 09/08/2018  . 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 Malnutrition of moderate degree 08/18/2018  . HIV disease ([place]) 08/18/2018  . Unintentional weight loss 08/18/2018  . Rheumatoid arthritis ([place]) 08/18/2018  . Knee mass, right 08/18/2018  . Pancytopenia ([place]) 08/18/2018  . Cigarette smoker 08/18/2018  . GERD (gastroesophageal reflux disease) 08/18/2018  . Psoriasis 08/18/2018  . History of MRSA infection 08/18/2018  . Neoplasm 06/21/2018  . Mass 04/08/2018    Past Medical History:  Diagnosis Date  . Angina pectoris ([place])   . HIV (human  immunodeficiency virus infection) ([place])   . Seropositive rheumatoid arthritis ([place])     Family History  Problem Relation Age of Onset  . Heart disease Mother   . Cancer Mother   . Cancer Father   . Heart disease Father   . Cancer Sister   . Heart disease Brother   . Healthy Son   . Healthy Daughter    Past Surgical History:  Procedure Laterality Date  . KNEE ARTHROSCOPY Left   . Right knee surgery     [facility] - at [place] History Narrative  . Not on file   Immunization History  Administered Date(s) Administered  . Influenza,inj,Quad PF,6+ Mos 09/02/2019     Objective: Vital Signs: There were no vitals taken for this visit.   Physical Exam   Musculoskeletal Exam: ***  CDAI Exam: CDAI Score: -- Patient Global: --; Provider Global: -- Swollen: --; Tender: -- Joint Exam 09/25/2022   No joint exam has been documented for this visit   There is currently no information documented on the homunculus. Go to the Rheumatology activity and complete the homunculus joint exam.  Investigation: No additional findings.  Imaging: No results found.  Recent Labs: Lab Results  Component Value Date   WBC 4.4 07/31/2022   HGB 13.4 07/31/2022   PLT 188 07/31/2022   NA 139 07/31/2022   K 3.9 07/31/2022   CL 104 07/31/2022   CO2 28 07/31/2022   GLUCOSE 93 07/31/2022   BUN 9 07/31/2022   CREATININE 0.94 07/31/2022   BILITOT 0.4 07/31/2022   ALKPHOS 168 (H) 08/17/2018   AST 30 07/31/2022   ALT 33 07/31/2022   PROT 7.9 07/31/2022  ALBUMIN 2.2 (L) 08/17/2018   CALCIUM 8.4 (L) 07/31/2022   GFRAA 129 12/28/2018   QFTBGOLDPLUS NEGATIVE 07/31/2022    Speciality Comments: Enbrel stopped 08/04/22 Humira started 08/12/22  Procedures:  No procedures performed Allergies: Meperidine and Meperidine hcl   Assessment / Plan:     Visit Diagnoses: No diagnosis found.  Orders: No orders of the defined types were placed in this encounter.  No orders of the  defined types were placed in this encounter.   Face-to-face time spent with patient was *** minutes. Greater than 50% of time was spent in counseling and coordination of care.  Follow-Up Instructions: No follow-ups on file.   Earnestine Mealing, CMA  Note - This record has been created using Editor, commissioning.  Chart creation errors have been sought, but may not always  have been located. Such creation errors do not reflect on  the standard of medical care.

## 2022-09-19 ENCOUNTER — Other Ambulatory Visit (HOSPITAL_COMMUNITY): Payer: Self-pay

## 2022-09-23 NOTE — Progress Notes (Deleted)
Office Visit Note  Patient: Patrick Huber             Date of Birth: 27-Oct-1971           MRN: [id number]             PCP: Michel Bickers, MD Referring: Michel Bickers, MD Visit Date: 09/30/2022 Occupation: '@GUAROCC'$ @  Subjective:    History of Present Illness: Patrick Huber is a 51 y.o. male with history of seropositive rheumatoid arthritis, psoriasis, and osteoarthritis.  He will remain on Humira 40 mg sq injections every 14 days.  Patient was started on Humira on 08/12/2022.  CBC and CMP were drawn on 07/31/2022.  Orders for CBC and CMP released today. His next lab work will be due in January and every 3 months.  Standing orders for CBC and CMP remain in place. TB Gold negative on 07/31/2022 and will continue to be monitored yearly. Discussed the importance of holding Humira if he develops signs or symptoms of infection and to resume once the infection has completely cleared.   Discussed the importance of yearly skin examinations while on Humira for skin cancer screening.  Dermatology referral placed at last office visit.    Activities of Daily Living:  Patient reports morning stiffness for *** {minute/hour:19697}.   Patient {ACTIONS;DENIES/REPORTS:21021675::"Denies"} nocturnal pain.  Difficulty dressing/grooming: {ACTIONS;DENIES/REPORTS:21021675::"Denies"} Difficulty climbing stairs: {ACTIONS;DENIES/REPORTS:21021675::"Denies"} Difficulty getting out of chair: {ACTIONS;DENIES/REPORTS:21021675::"Denies"} Difficulty using hands for taps, buttons, cutlery, and/or writing: {ACTIONS;DENIES/REPORTS:21021675::"Denies"}  No Rheumatology ROS completed.   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   Malnutrition of moderate degree 08/18/2018   HIV disease ([place]) 08/18/2018   Unintentional weight loss 08/18/2018   Rheumatoid arthritis ([place]) 08/18/2018   Knee mass, right 08/18/2018   Pancytopenia ([place]) 08/18/2018   Cigarette smoker 08/18/2018   GERD (gastroesophageal reflux disease) 08/18/2018   Psoriasis 08/18/2018   History of MRSA infection 08/18/2018   Neoplasm 06/21/2018   Mass 04/08/2018    Past Medical History:  Diagnosis Date   Angina pectoris ([facility])    HIV (human immunodeficiency virus infection) ([place])    Seropositive rheumatoid arthritis ([place])     Family History  Problem Relation Age of Onset   Heart disease Mother    Cancer Mother    Cancer Father    Heart disease Father    Cancer Sister    Heart disease Brother    Healthy Son    Healthy Daughter    Past Surgical History:  Procedure Laterality Date   KNEE ARTHROSCOPY Left    Right knee surgery     [facility] History Narrative   Not on file   Immunization History  Administered Date(s) Administered   Influenza,inj,Quad PF,6+ Mos 09/02/2019     Objective: Vital Signs: There were no vitals taken for this visit.   Physical Exam Vitals and nursing note reviewed.  Constitutional:      Appearance: He is well-developed.  HENT:     Head: Normocephalic and atraumatic.  Eyes:  Conjunctiva/sclera: Conjunctivae normal.     Pupils: Pupils are equal, round, and reactive to light.  Cardiovascular:     Rate and Rhythm: Normal rate and regular rhythm.     Heart sounds: Normal heart sounds.  Pulmonary:     Effort: Pulmonary effort is normal.     Breath sounds: Normal breath sounds.  Abdominal:     General: Bowel sounds are normal.     Palpations: Abdomen is soft.  Musculoskeletal:      Cervical back: Normal range of motion and neck supple.  Skin:    General: Skin is warm and dry.     Capillary Refill: Capillary refill takes less than 2 seconds.  Neurological:     Mental Status: He is alert and oriented to person, place, and time.  Psychiatric:        Behavior: Behavior normal.      Musculoskeletal Exam: ***  CDAI Exam: CDAI Score: -- Patient Global: --; Provider Global: -- Swollen: --; Tender: -- Joint Exam 09/30/2022   No joint exam has been documented for this visit   There is currently no information documented on the homunculus. Go to the Rheumatology activity and complete the homunculus joint exam.  Investigation: No additional findings.  Imaging: No results found.  Recent Labs: Lab Results  Component Value Date   WBC 4.4 07/31/2022   HGB 13.4 07/31/2022   PLT 188 07/31/2022   NA 139 07/31/2022   K 3.9 07/31/2022   CL 104 07/31/2022   CO2 28 07/31/2022   GLUCOSE 93 07/31/2022   BUN 9 07/31/2022   CREATININE 0.94 07/31/2022   BILITOT 0.4 07/31/2022   ALKPHOS 168 (H) 08/17/2018   AST 30 07/31/2022   ALT 33 07/31/2022   PROT 7.9 07/31/2022   ALBUMIN 2.2 (L) 08/17/2018   CALCIUM 8.4 (L) 07/31/2022   GFRAA 129 12/28/2018   QFTBGOLDPLUS NEGATIVE 07/31/2022    Speciality Comments: Enbrel stopped 08/04/22 Humira started 08/12/22  Procedures:  No procedures performed Allergies: Meperidine and Meperidine hcl   Assessment / Plan:     Visit Diagnoses: No diagnosis found.  Orders: No orders of the defined types were placed in this encounter.  No orders of the defined types were placed in this encounter.   Face-to-face time spent with patient was *** minutes. Greater than 50% of time was spent in counseling and coordination of care.  Follow-Up Instructions: No follow-ups on file.   Earnestine Mealing, CMA  Note - This record has been created using Editor, commissioning.  Chart creation errors have been sought, but may not always  have  been located. Such creation errors do not reflect on  the standard of medical care.

## 2022-09-25 ENCOUNTER — Ambulatory Visit: Payer: Medicaid Other | Admitting: Physician Assistant

## 2022-09-25 DIAGNOSIS — L409 Psoriasis, unspecified: Secondary | ICD-10-CM

## 2022-09-25 DIAGNOSIS — Z79899 Other long term (current) drug therapy: Secondary | ICD-10-CM

## 2022-09-25 DIAGNOSIS — M19042 Primary osteoarthritis, left hand: Secondary | ICD-10-CM

## 2022-09-25 DIAGNOSIS — M19071 Primary osteoarthritis, right ankle and foot: Secondary | ICD-10-CM

## 2022-09-25 DIAGNOSIS — M170 Bilateral primary osteoarthritis of knee: Secondary | ICD-10-CM

## 2022-09-25 DIAGNOSIS — Z8614 Personal history of Methicillin resistant Staphylococcus aureus infection: Secondary | ICD-10-CM

## 2022-09-25 DIAGNOSIS — M059 Rheumatoid arthritis with rheumatoid factor, unspecified: Secondary | ICD-10-CM

## 2022-09-25 DIAGNOSIS — M24521 Contracture, right elbow: Secondary | ICD-10-CM

## 2022-09-25 DIAGNOSIS — A528 Late syphilis, latent: Secondary | ICD-10-CM

## 2022-09-25 DIAGNOSIS — F17210 Nicotine dependence, cigarettes, uncomplicated: Secondary | ICD-10-CM

## 2022-09-25 DIAGNOSIS — B20 Human immunodeficiency virus [HIV] disease: Secondary | ICD-10-CM

## 2022-09-25 DIAGNOSIS — G629 Polyneuropathy, unspecified: Secondary | ICD-10-CM

## 2022-09-26 ENCOUNTER — Other Ambulatory Visit (HOSPITAL_COMMUNITY): Payer: Self-pay

## 2022-09-30 ENCOUNTER — Ambulatory Visit: Payer: Medicare Other | Attending: Physician Assistant | Admitting: Physician Assistant

## 2022-09-30 DIAGNOSIS — Z8614 Personal history of Methicillin resistant Staphylococcus aureus infection: Secondary | ICD-10-CM

## 2022-09-30 DIAGNOSIS — M24521 Contracture, right elbow: Secondary | ICD-10-CM

## 2022-09-30 DIAGNOSIS — M19071 Primary osteoarthritis, right ankle and foot: Secondary | ICD-10-CM

## 2022-09-30 DIAGNOSIS — Z79899 Other long term (current) drug therapy: Secondary | ICD-10-CM

## 2022-09-30 DIAGNOSIS — G629 Polyneuropathy, unspecified: Secondary | ICD-10-CM

## 2022-09-30 DIAGNOSIS — L409 Psoriasis, unspecified: Secondary | ICD-10-CM

## 2022-09-30 DIAGNOSIS — M059 Rheumatoid arthritis with rheumatoid factor, unspecified: Secondary | ICD-10-CM

## 2022-09-30 DIAGNOSIS — F17210 Nicotine dependence, cigarettes, uncomplicated: Secondary | ICD-10-CM

## 2022-09-30 DIAGNOSIS — M170 Bilateral primary osteoarthritis of knee: Secondary | ICD-10-CM

## 2022-09-30 DIAGNOSIS — A528 Late syphilis, latent: Secondary | ICD-10-CM

## 2022-09-30 DIAGNOSIS — B20 Human immunodeficiency virus [HIV] disease: Secondary | ICD-10-CM

## 2022-09-30 DIAGNOSIS — M19041 Primary osteoarthritis, right hand: Secondary | ICD-10-CM

## 2022-10-08 ENCOUNTER — Other Ambulatory Visit (HOSPITAL_COMMUNITY): Payer: Self-pay

## 2022-10-14 ENCOUNTER — Other Ambulatory Visit (HOSPITAL_COMMUNITY): Payer: Self-pay

## 2022-10-31 ENCOUNTER — Other Ambulatory Visit (HOSPITAL_COMMUNITY): Payer: Self-pay

## 2022-11-13 ENCOUNTER — Other Ambulatory Visit (HOSPITAL_COMMUNITY): Payer: Self-pay

## 2022-11-13 ENCOUNTER — Other Ambulatory Visit: Payer: Self-pay

## 2022-12-04 ENCOUNTER — Other Ambulatory Visit (HOSPITAL_COMMUNITY): Payer: Self-pay

## 2022-12-04 ENCOUNTER — Other Ambulatory Visit: Payer: Self-pay | Admitting: Internal Medicine

## 2022-12-04 DIAGNOSIS — Z79899 Other long term (current) drug therapy: Secondary | ICD-10-CM

## 2022-12-04 DIAGNOSIS — M059 Rheumatoid arthritis with rheumatoid factor, unspecified: Secondary | ICD-10-CM

## 2022-12-05 ENCOUNTER — Other Ambulatory Visit (HOSPITAL_COMMUNITY): Payer: Self-pay

## 2022-12-05 ENCOUNTER — Other Ambulatory Visit: Payer: Self-pay | Admitting: Physician Assistant

## 2022-12-05 DIAGNOSIS — Z79899 Other long term (current) drug therapy: Secondary | ICD-10-CM

## 2022-12-05 DIAGNOSIS — M059 Rheumatoid arthritis with rheumatoid factor, unspecified: Secondary | ICD-10-CM

## 2022-12-06 ENCOUNTER — Other Ambulatory Visit (HOSPITAL_COMMUNITY): Payer: Self-pay

## 2022-12-09 ENCOUNTER — Other Ambulatory Visit: Payer: Self-pay

## 2022-12-11 ENCOUNTER — Other Ambulatory Visit (HOSPITAL_COMMUNITY): Payer: Self-pay

## 2022-12-17 ENCOUNTER — Other Ambulatory Visit (HOSPITAL_COMMUNITY): Payer: Self-pay

## 2023-01-01 ENCOUNTER — Other Ambulatory Visit (HOSPITAL_COMMUNITY): Payer: Self-pay

## 2023-01-03 ENCOUNTER — Other Ambulatory Visit (HOSPITAL_COMMUNITY): Payer: Self-pay

## 2023-01-09 ENCOUNTER — Other Ambulatory Visit (HOSPITAL_COMMUNITY): Payer: Self-pay

## 2023-01-13 NOTE — Progress Notes (Unsigned)
Office Visit Note  Patient: Patrick Huber             Date of Birth: [birth year]           MRN: [id number]             PCP: Michel Bickers, MD Referring: Michel Bickers, MD Visit Date: 01/14/2023 Occupation: @GUAROCC$ @  Subjective:  Pain in multiple joints   History of Present Illness: Patrick Huber is a 52 y.o. male with history of seropositive and osteoarthritis.  Patient was last seen in the office on 07/31/2022.  He initiated Humira on 08/12/2022 due to inadequate response to Enbrel.  He has been tolerating Humira without any side effects or injection site reactions.  He denies any recurrent infections.  He denies any new medical conditions.  Patient reports that he has been out of his prescription for the past 3 weeks and is currently experiencing increased joint pain, stiffness, and swelling in multiple joints.  Patient reports that he is unsure if Humira is as effective as Enbrel was in the past.  He states that he starts to have some breakthrough symptoms on day 10 between each injection dose.  He states that 90% of the time his pain is most severe in both knee joints and the right ankle joint but he has occasional pain and stiffness in his elbows and shoulders.  He states he is starting to have some difficulty with ambulation since having the cath and Humira.   Activities of Daily Living:  Patient reports morning stiffness for 2-3 hours.   Patient Reports nocturnal pain.  Difficulty dressing/grooming: Denies Difficulty climbing stairs: Reports Difficulty getting out of chair: Reports Difficulty using hands for taps, buttons, cutlery, and/or writing: Denies  Review of Systems  Constitutional:  Negative for fatigue.  HENT:  Negative for mouth sores and mouth dryness.   Eyes:  Negative for dryness.  Respiratory:  Negative for shortness of breath.   Cardiovascular:  Negative for chest pain and palpitations.  Gastrointestinal:  Negative for blood in stool, constipation and  diarrhea.  Endocrine: Negative for increased urination.  Genitourinary:  Negative for involuntary urination.  Musculoskeletal:  Positive for joint pain, joint pain, joint swelling and morning stiffness. Negative for gait problem, myalgias, muscle weakness, muscle tenderness and myalgias.  Skin:  Negative for color change, rash, hair loss and sensitivity to sunlight.  Allergic/Immunologic: Negative for susceptible to infections.  Neurological:  Negative for dizziness and headaches.  Hematological:  Negative for swollen glands.  Psychiatric/Behavioral:  Negative for depressed mood and sleep disturbance. The patient is not nervous/anxious.     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   Malnutrition of moderate degree 08/18/2018   HIV disease ([place]) 08/18/2018   Unintentional weight loss 08/18/2018   Rheumatoid arthritis ([place]) 08/18/2018   Knee mass, right 08/18/2018   Pancytopenia ([place]) 08/18/2018   Cigarette smoker 08/18/2018   GERD (gastroesophageal reflux disease) 08/18/2018   Psoriasis 08/18/2018   History of MRSA infection 08/18/2018   Neoplasm 06/21/2018   Mass 04/08/2018  Past Medical History:  Diagnosis Date   Angina pectoris (HCC)    HIV (human immunodeficiency virus infection) ([place])    Seropositive rheumatoid arthritis ([place])     Family History  Problem Relation Age of Onset   Heart disease Mother    Cancer Mother    Cancer Father    Heart disease Father    Cancer Sister    Heart disease  Brother    Healthy Son    Healthy Daughter    Past Surgical History:  Procedure Laterality Date   KNEE ARTHROSCOPY Left    Right knee surgery     [facility] - at [facility]   Social History   Social History Narrative   Not on file   Immunization History  Administered Date(s) Administered   Influenza,inj,Quad PF,6+ Mos 09/02/2019     Objective: Vital Signs: BP 129/80 (BP Location: Left Arm, Patient Position: Sitting, Cuff Size: Large)   Pulse 84   Resp 18   Ht 6' 1"$  (1.854 m)   Wt 217 lb (98.4 kg)   BMI 28.63 kg/m    Physical Exam Vitals and nursing note reviewed.  Constitutional:      Appearance: He is well-developed.  HENT:     Head: Normocephalic and atraumatic.  Eyes:     Conjunctiva/sclera: Conjunctivae normal.     Pupils: Pupils are equal, round, and reactive to light.  Cardiovascular:     Rate and Rhythm: Normal rate and regular rhythm.     Heart sounds: Normal heart sounds.  Pulmonary:     Effort: Pulmonary effort is normal.     Breath sounds: Normal breath sounds.  Abdominal:     General: Bowel sounds are normal.     Palpations: Abdomen is soft.  Musculoskeletal:     Cervical back: Normal range of motion and neck supple.  Skin:    General: Skin is warm and dry.     Capillary Refill: Capillary refill takes less than 2 seconds.  Neurological:     Mental Status: He is alert and oriented to person, place, and time.  Psychiatric:        Behavior: Behavior normal.      Musculoskeletal Exam: C-spine has limited range of motion with lateral rotation.  Shoulder joints have good range of motion with some discomfort bilaterally.  Bilateral elbow joint flexion contractures.  Tenderness along the elbow joint line bilaterally.  Wrist joints have good range of motion.  No tenderness or synovitis over MCP or PIP joints.  PIP and DIP thickening consistent with osteoarthritis of both hands.  Hip joints have limited range of motion with discomfort bilaterally.  Painful range of  motion of both knee joints.  Warmth and swelling in the left knee noted.  Tenderness and swelling of the right ankle joint.  CDAI Exam: CDAI Score: -- Patient Global: 7 mm; Provider Global: 7 mm Swollen: --; Tender: -- Joint Exam 01/14/2023   No joint exam has been documented for this visit   There is currently no information documented on the homunculus. Go to the Rheumatology activity and complete the homunculus joint exam.  Investigation: No additional findings.  Imaging: No results found.  Recent Labs: Lab Results  Component Value Date   WBC 4.4 07/31/2022   HGB 13.4 07/31/2022   PLT 188 07/31/2022   NA 139 07/31/2022   K 3.9 07/31/2022   CL 104 07/31/2022   CO2 28 07/31/2022   GLUCOSE 93 07/31/2022   BUN 9 07/31/2022   CREATININE  0.94 07/31/2022   BILITOT 0.4 07/31/2022   ALKPHOS 168 (H) 08/17/2018   AST 30 07/31/2022   ALT 33 07/31/2022   PROT 7.9 07/31/2022   ALBUMIN 2.2 (L) 08/17/2018   CALCIUM 8.4 (L) 07/31/2022   GFRAA 129 12/28/2018   QFTBGOLDPLUS NEGATIVE 07/31/2022    Speciality Comments: Enbrel stopped 08/04/22 Humira started 08/12/22  Procedures:  No procedures performed Allergies: Meperidine and Meperidine hcl    Assessment / Plan:     Visit Diagnoses: Seropositive rheumatoid arthritis ([place]) - Anti-CCP>250, RF 20, ESR 72, Bilateral knee swelling, bilateral elbow joint contractures, +synovitis: Patient was last seen in the office on 07/31/2022 at which time he was experiencing inadequate response to Enbrel.  He was switched to Humira on 08/12/2022.  He has been tolerating Humira without any side effects or injection site reactions.  He initially noticed improvement when switching from Enbrel to Humira but has been having breakthrough symptoms on day 10 of his dosing cycle.  His pain has been most severe in his right elbow, both knees, and the right ankle joint.  He remains on diclofenac 50 mg twice daily and hydrocodone for pain relief.  He has been out of  Humira for the past 3 weeks due to issues with insurance and due to requiring updated lab work and a follow-up visit.  CBC and CMP were updated today.  A refill of Humira will be sent to the pharmacy.  Discussed different treatment options since he has been experiencing breakthrough symptoms but he would like to hold off on making any medication changes at this time.  He would like to remain on Humira consistently and for [place] to reevaluate at his follow-up visit.  He will follow-up in the office in 2 months or sooner if needed.  High risk medication use - Humira 40 mg sq injections every 14 days.  Inadequate response to Enbrel 50 mg sq injections once weekly.  Patient initiated Humira on 08/12/2022.  Previously had an adequate response to Enbrel. CBC and CMP updated on 07/31/22.  Orders for CBC and CMP released today.  His next lab work will be due in May and every 3 months to monitor for drug toxicity. TB gold negative on 07/31/22.  Discussed the importance of holding Humira if he develops signs or symptoms of an infection and to resume once the infection is completely cleared. - Plan: CBC with Differential/Platelet, COMPLETE METABOLIC PANEL WITH GFR  Psoriasis: No active psoriasis at this time.  Primary osteoarthritis of both hands - X-rays of both hands were consistent with osteoarthritis on 07/30/2021.  He has PIP and DIP thickening consistent with osteoarthritis of both hands.  Contracture of joint of both elbows - X-rays of both elbows were obtained on 07/30/2021 which were consistent with inflammatory arthritis.  Bilateral flexion contractures in both elbows are unchanged.  He has tenderness over both elbow joints.  Primary osteoarthritis of both knees - X-rays of both knees were obtained on 07/30/2021 which were consistent with mild osteoarthritis and mild chondromalacia patella.  Patient continues to have chronic pain in both knee joints, right greater than left.  On examination he has warmth and  swelling in both knees.  At times he has difficulty ambulating due to severity of pain.  He has been taking diclofenac 50 mg twice daily and hydrocodone as needed for pain relief.  Primary osteoarthritis of both feet - X-rays of both feet were consistent with osteoarthritic changes on 07/30/2021.  He experiences intermittent discomfort and stiffness  in his right ankle.  He has intermittent swelling as well as tenderness upon palpation today.  Refill of Humira was sent to the pharmacy today.  Other medical conditions are listed as follows:  Late latent syphilis  HIV disease ([place])  History of MRSA infection  Cigarette smoker  Neuropathy: Patient remains on gabapentin as prescribed.  Pancytopenia ([place]): CBC with differential within normal limits on 07/31/2022.  CBC with differential ordered today.  Orders: Orders Placed This Encounter  Procedures   CBC with Differential/Platelet   COMPLETE METABOLIC PANEL WITH GFR   No orders of the defined types were placed in this encounter.    Follow-Up Instructions: Return in about 2 months (around 03/15/2023) for Rheumatoid arthritis, Osteoarthritis.   Ofilia Neas, PA-C  Note - This record has been created using Dragon software.  Chart creation errors have been sought, but may not always  have been located. Such creation errors do not reflect on  the standard of medical care.

## 2023-01-14 ENCOUNTER — Other Ambulatory Visit (HOSPITAL_COMMUNITY): Payer: Self-pay

## 2023-01-14 ENCOUNTER — Encounter: Payer: Self-pay | Admitting: Physician Assistant

## 2023-01-14 ENCOUNTER — Ambulatory Visit: Payer: 59 | Attending: Physician Assistant | Admitting: Physician Assistant

## 2023-01-14 VITALS — BP 129/80 | HR 84 | Resp 18 | Ht 73.0 in | Wt 217.0 lb

## 2023-01-14 DIAGNOSIS — M19071 Primary osteoarthritis, right ankle and foot: Secondary | ICD-10-CM

## 2023-01-14 DIAGNOSIS — M170 Bilateral primary osteoarthritis of knee: Secondary | ICD-10-CM

## 2023-01-14 DIAGNOSIS — G629 Polyneuropathy, unspecified: Secondary | ICD-10-CM

## 2023-01-14 DIAGNOSIS — M19041 Primary osteoarthritis, right hand: Secondary | ICD-10-CM | POA: Diagnosis not present

## 2023-01-14 DIAGNOSIS — M19072 Primary osteoarthritis, left ankle and foot: Secondary | ICD-10-CM

## 2023-01-14 DIAGNOSIS — B20 Human immunodeficiency virus [HIV] disease: Secondary | ICD-10-CM

## 2023-01-14 DIAGNOSIS — Z79899 Other long term (current) drug therapy: Secondary | ICD-10-CM | POA: Diagnosis not present

## 2023-01-14 DIAGNOSIS — L409 Psoriasis, unspecified: Secondary | ICD-10-CM | POA: Diagnosis not present

## 2023-01-14 DIAGNOSIS — M19042 Primary osteoarthritis, left hand: Secondary | ICD-10-CM

## 2023-01-14 DIAGNOSIS — F17210 Nicotine dependence, cigarettes, uncomplicated: Secondary | ICD-10-CM

## 2023-01-14 DIAGNOSIS — M059 Rheumatoid arthritis with rheumatoid factor, unspecified: Secondary | ICD-10-CM

## 2023-01-14 DIAGNOSIS — D61818 Other pancytopenia: Secondary | ICD-10-CM

## 2023-01-14 DIAGNOSIS — M24521 Contracture, right elbow: Secondary | ICD-10-CM

## 2023-01-14 DIAGNOSIS — M24522 Contracture, left elbow: Secondary | ICD-10-CM

## 2023-01-14 DIAGNOSIS — Z8614 Personal history of Methicillin resistant Staphylococcus aureus infection: Secondary | ICD-10-CM

## 2023-01-14 DIAGNOSIS — A528 Late syphilis, latent: Secondary | ICD-10-CM

## 2023-01-14 NOTE — Patient Instructions (Signed)
Standing Labs We placed an order today for your standing lab work.   Please have your standing labs drawn in May and every 3 months   Please have your labs drawn 2 weeks prior to your appointment so that the provider can discuss your lab results at your appointment.  Please note that you may see your imaging and lab results in [place] before we have reviewed them. We will contact you once all results are reviewed. Please allow our office up to 72 hours to thoroughly review all of the results before contacting the office for clarification of your results.  Lab hours are:   Monday through Thursday from 8:00 am -12:30 pm and 1:00 pm-5:00 pm and Friday from 8:00 am-12:00 pm.  Please be advised, all patients with office appointments requiring lab work will take precedent over walk-in lab work.   Labs are drawn by [facility]. Please bring your co-pay at the time of your lab draw.  You may receive a bill from [facility] for your lab work.  Please note if you are on Hydroxychloroquine and and an order has been placed for a Hydroxychloroquine level, you will need to have it drawn 4 hours or more after your last dose.  If you wish to have your labs drawn at another location, please call the office 24 hours in advance so we can fax the orders.  The office is located at [address] [place], [place], [place] [address] No appointment is necessary.    If you have any questions regarding directions or hours of operation,  please call [phone].   As a reminder, please drink plenty of water prior to coming for your lab work. Thanks!  If you have signs or symptoms of an infection or start antibiotics: First, call your PCP for workup of your infection. Hold your medication through the infection, until you complete your antibiotics, and until symptoms resolve if you take the following: Injectable medication (Actemra, Benlysta, Cimzia, Cosentyx, Enbrel, Humira, Kevzara, Orencia, Remicade, Simponi,  Stelara, Taltz, Tremfya) Methotrexate Leflunomide (Arava) Mycophenolate (Cellcept) Morrie Sheldon, Olumiant, or Rinvoq  Vaccines You are taking a medication(s) that can suppress your immune system.  The following immunizations are recommended: Flu annually Covid-19  Td/Tdap (tetanus, diphtheria, pertussis) every 10 years Pneumonia (Prevnar 15 then Pneumovax 23 at least 1 year apart.  Alternatively, can take Prevnar 20 without needing additional dose) Shingrix: 2 doses from 4 weeks to 6 months apart  Please check with your PCP to make sure you are up to date.

## 2023-01-15 LAB — CBC WITH DIFFERENTIAL/PLATELET
Absolute Monocytes: 308 cells/uL (ref 200–950)
Basophils Absolute: 21 cells/uL (ref 0–200)
Basophils Relative: 0.5 %
Eosinophils Absolute: 29 cells/uL (ref 15–500)
Eosinophils Relative: 0.7 %
HCT: 42.3 % (ref 38.5–50.0)
Hemoglobin: 14.5 g/dL (ref 13.2–17.1)
Lymphs Abs: 1624 cells/uL (ref 850–3900)
MCH: 31.5 pg (ref 27.0–33.0)
MCHC: 34.3 g/dL (ref 32.0–36.0)
MCV: 91.8 fL (ref 80.0–100.0)
MPV: 9.4 fL (ref 7.5–12.5)
Monocytes Relative: 7.5 %
Neutro Abs: 2120 cells/uL (ref 1500–7800)
Neutrophils Relative %: 51.7 %
Platelets: 202 10*3/uL (ref 140–400)
RBC: 4.61 10*6/uL (ref 4.20–5.80)
RDW: 14.2 % (ref 11.0–15.0)
Total Lymphocyte: 39.6 %
WBC: 4.1 10*3/uL (ref 3.8–10.8)

## 2023-01-15 LAB — COMPLETE METABOLIC PANEL WITH GFR
AG Ratio: 1.2 (calc) (ref 1.0–2.5)
ALT: 98 U/L — ABNORMAL HIGH (ref 9–46)
AST: 92 U/L — ABNORMAL HIGH (ref 10–35)
Albumin: 4.2 g/dL (ref 3.6–5.1)
Alkaline phosphatase (APISO): 138 U/L (ref 35–144)
BUN: 13 mg/dL (ref 7–25)
CO2: 31 mmol/L (ref 20–32)
Calcium: 8.4 mg/dL — ABNORMAL LOW (ref 8.6–10.3)
Chloride: 99 mmol/L (ref 98–110)
Creat: 0.96 mg/dL (ref 0.70–1.30)
Globulin: 3.5 g/dL (calc) (ref 1.9–3.7)
Glucose, Bld: 103 mg/dL — ABNORMAL HIGH (ref 65–99)
Potassium: 3.8 mmol/L (ref 3.5–5.3)
Sodium: 139 mmol/L (ref 135–146)
Total Bilirubin: 0.5 mg/dL (ref 0.2–1.2)
Total Protein: 7.7 g/dL (ref 6.1–8.1)
eGFR: 96 mL/min/{1.73_m2} (ref >=60)

## 2023-01-15 NOTE — Progress Notes (Signed)
CBC WNL.  Calcium is borderline low.  LFTs are elevated-AST-92 and ALT-98.  Please clarify if he has been taking any additional tylenol or alcohol use? Any other changes in medications?

## 2023-01-21 ENCOUNTER — Other Ambulatory Visit (HOSPITAL_COMMUNITY): Payer: Self-pay

## 2023-01-21 ENCOUNTER — Other Ambulatory Visit: Payer: Self-pay | Admitting: Physician Assistant

## 2023-01-21 ENCOUNTER — Other Ambulatory Visit: Payer: Self-pay | Admitting: Rheumatology

## 2023-01-21 DIAGNOSIS — Z79899 Other long term (current) drug therapy: Secondary | ICD-10-CM

## 2023-01-21 DIAGNOSIS — M059 Rheumatoid arthritis with rheumatoid factor, unspecified: Secondary | ICD-10-CM

## 2023-01-21 MED ORDER — HUMIRA (2 PEN) 40 MG/0.4ML ~~LOC~~ AJKT
40.0000 mg | AUTO-INJECTOR | SUBCUTANEOUS | 0 refills | Status: DC
  Filled 2023-01-21 (×2): qty 2, 28d supply, fill #0
  Filled 2023-02-11: qty 2, 28d supply, fill #1
  Filled 2023-03-06: qty 2, 28d supply, fill #2

## 2023-01-21 NOTE — Telephone Encounter (Signed)
Next Visit: 03/17/2023  Last Visit: 01/14/2023  Last Fill: 08/12/2022  [id number] rheumatoid arthritis   Current Dose per office note 01/14/2022: Humira 40 mg sq injections every 14 days   Labs: 01/14/2023 CBC WNL.Calcium is borderline low. LFTs are elevated-AST-92 and ALT-98.   TB Gold: 07/31/2022 Neg    Okay to refill Humira?

## 2023-01-21 NOTE — Telephone Encounter (Signed)
Patient called the office requesting a refill of Humira to be sent to [facility]

## 2023-01-22 ENCOUNTER — Other Ambulatory Visit: Payer: Self-pay

## 2023-02-11 ENCOUNTER — Other Ambulatory Visit (HOSPITAL_COMMUNITY): Payer: Self-pay

## 2023-02-12 ENCOUNTER — Other Ambulatory Visit (HOSPITAL_COMMUNITY): Payer: Self-pay

## 2023-03-03 NOTE — Progress Notes (Signed)
Office Visit Note  Patient: Patrick Huber             Date of Birth: September 25, 1971           MRN: [id number]             PCP: Cliffton Asters, MD Referring: Cliffton Asters, MD Visit Date: 03/17/2023 Occupation: @GUAROCC @  Subjective:  Chronic pain   History of Present Illness: Patrick Huber is a 52 y.o. male with history of seropositive rheumatoid arthritis and psoriasis.  Patient remains on Humira 40 mg subcutaneous injections every 14 days.  Cording to the patient after his last office visit he had a gap in therapy for about 1 month due to issues receiving the refill.  He has been back on Humira for the 2 doses.  He continues to have pain in both SI joints, both hips, both knees, both ankles.  He notices intermittent swelling in his knees and ankle joints.  He has been taking Norco for pain relief as prescribed at pain management.  He denies any psoriasis at this time.  He denies any eye pain or inflammation.    Activities of Daily Living:  Patient reports morning stiffness for a few hours.   Patient Reports nocturnal pain.  Difficulty dressing/grooming: Denies Difficulty climbing stairs: Reports Difficulty getting out of chair: Reports Difficulty using hands for taps, buttons, cutlery, and/or writing: Denies  Review of Systems  Constitutional:  Negative for fatigue.  HENT:  Negative for mouth sores and mouth dryness.   Eyes:  Negative for dryness.  Respiratory:  Positive for shortness of breath.   Cardiovascular:  Negative for chest pain and palpitations.  Gastrointestinal:  Negative for blood in stool, constipation and diarrhea.  Endocrine: Negative for increased urination.  Genitourinary:  Negative for involuntary urination.  Musculoskeletal:  Positive for joint pain, gait problem, joint pain, joint swelling, myalgias, muscle weakness, morning stiffness, muscle tenderness and myalgias.  Skin:  Negative for color change, rash, hair loss and sensitivity to sunlight.   Allergic/Immunologic: Negative for susceptible to infections.  Neurological:  Negative for dizziness and headaches.  Hematological:  Negative for swollen glands.  Psychiatric/Behavioral:  Positive for sleep disturbance. Negative for depressed mood. The patient is not nervous/anxious.     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   Malnutrition of moderate degree 08/18/2018   HIV disease 08/18/2018   Unintentional weight loss 08/18/2018   Rheumatoid arthritis 08/18/2018   Knee mass, right 08/18/2018   Pancytopenia 08/18/2018   Cigarette smoker 08/18/2018   GERD (gastroesophageal reflux disease) 08/18/2018   Psoriasis 08/18/2018   History of MRSA infection 08/18/2018   Neoplasm 06/21/2018   Mass 04/08/2018    Past Medical History:  Diagnosis Date   Angina pectoris    HIV (human immunodeficiency virus infection)    Seropositive rheumatoid arthritis     Family History  Problem Relation Age of Onset   Heart disease Mother    Cancer Mother    Cancer Father    Heart disease Father    Cancer Sister  Heart disease Brother    Healthy Son    Healthy Daughter    Past Surgical History:  Procedure Laterality Date   KNEE ARTHROSCOPY Left    Right knee surgery     [facility] - at [facility]   Social History   Social History Narrative   Not on file   Immunization History  Administered Date(s) Administered   Influenza,inj,Quad PF,6+ Mos 09/02/2019     Objective: Vital Signs: BP 116/77 (BP Location: Left Arm, Patient  Position: Standing, Cuff Size: Normal)   Pulse 97   Ht  (1.854 m)   Wt 209 lb 12.8 oz (95.2 kg)   BMI 27.68 kg/m    Physical Exam Vitals and nursing note reviewed.  Constitutional:      Appearance: He is well-developed.  HENT:     Head: Normocephalic and atraumatic.  Eyes:     Conjunctiva/sclera: Conjunctivae normal.     Pupils: Pupils are equal, round, and reactive to light.  Cardiovascular:     Rate and Rhythm: Normal rate and regular rhythm.     Heart sounds: Normal heart sounds.  Pulmonary:     Effort: Pulmonary effort is normal.     Breath sounds: Normal breath sounds.  Abdominal:     General: Bowel sounds are normal.     Palpations: Abdomen is soft.  Musculoskeletal:     Cervical back: Normal range of motion and neck supple.  Skin:    General: Skin is warm and dry.     Capillary Refill: Capillary refill takes less than 2 seconds.  Neurological:     Mental Status: He is alert and oriented to person, place, and time.  Psychiatric:        Behavior: Behavior normal.      Musculoskeletal Exam: C-spine has limited range of motion with lateral rotation.  Painful range of motion above shoulder joints with limited abduction to about 120 degrees.  Bilateral elbow joint flexion contractures noted.  Tenderness and thickening of both elbows, right greater than left.  Wrist joints have good range of motion with no tenderness or synovitis.  PIP and DIP thickening consistent with osteoarthritis of both hands.  Hip joints have limited range of motion with discomfort.  Painful range of motion of both knee joints.  Warmth in the left knee noted.  Tenderness over both ankles, right greater than left.  Some warmth and swelling in the right ankle noted.  CDAI Exam: CDAI Score: -- Patient Global: 5 mm; Provider Global: 5 mm Swollen: --; Tender: -- Joint Exam 03/17/2023   No joint exam has been documented for this visit   There is currently no information documented on the  homunculus. Go to the Rheumatology activity and complete the homunculus joint exam.  Investigation: No additional findings.  Imaging: No results found.  Recent Labs: Lab Results  Component Value Date   WBC 4.1 01/14/2023   HGB 14.5 01/14/2023   PLT 202 01/14/2023   NA 139 01/14/2023   K 3.8 01/14/2023   CL 99 01/14/2023   CO2 31 01/14/2023   GLUCOSE 103 (H) 01/14/2023   BUN 13 01/14/2023   CREATININE 0.96 01/14/2023   BILITOT 0.5 01/14/2023   ALKPHOS 168 (H) 08/17/2018   AST 92 (H) 01/14/2023   ALT 98 (H) 01/14/2023   PROT 7.7 01/14/2023   ALBUMIN 2.2 (L) 08/17/2018   CALCIUM 8.4 (L) 01/14/2023   GFRAA 129 12/28/2018   QFTBGOLDPLUS NEGATIVE 07/31/2022    Speciality Comments: Enbrel stopped 08/04/22  Humira started 08/12/22  Procedures:  No procedures performed Allergies: Meperidine and Meperidine hcl   Assessment / Plan:     Visit Diagnoses: Seropositive rheumatoid arthritis - Anti-CCP>250, RF 20, ESR 72, Bilateral knee swelling, bilateral elbow joint contractures, +synovitis: Patient has been on Humira since 08/12/2022 but has had several gaps in therapy.  He was last seen in the office on 01/14/2023 at which time we discussed different medication options due to him experiencing breakthrough symptoms on day 10 of his Humira dosing cycle.  He requested to stay on Humira as prescribed but unfortunately recently had a 1 month gap in therapy.  He has had 2 doses of humira since his last visit.  He is due for his third dose of Humira tomorrow.  He continues to have chronic pain involving multiple joints.  He is followed closely by pain management and takes Norco for pain relief.  He is also taking diclofenac 50 mg 1 tablet twice daily for pain relief. His pain is most severe in both hips, both knees, both ankle joints.  He notices intermittent swelling in his ankles and knees.  At times he has difficulty ambulating.  He would like to remain on Humira for a full 3 to 4 months prior to  making any medication changes.  He does not need a refill of Humira today.  He will follow-up in the office in 3 months or sooner if needed.  If he continues to have recurrent flares we will need to discuss other treatment options.  High risk medication use - Humira 40 mg sq injections every 14 days.   Inadequate response to Enbrel 50 mg sq injections once weekly.  Patient initiated Humira on 08/12/2022. TB gold negative on 07/31/22.  Future order for TB Gold placed today. CBC and CMP were drawn on 01/14/2023: AST was 92 and ALT was 98.  He takes Norco prescribed by pain management.  He has not been taking any additional Tylenol recently.  He does not drink alcohol.  CBC and CMP were updated today.  His next lab work will be due in 3 months. He has not had any recent or recurrent infections.  Discussed the importance of holding Humira if he develops signs or symptoms of an infection and to resume once the infection is completely cleared.  - Plan: CBC with Differential/Platelet, COMPLETE METABOLIC PANEL WITH GFR, QuantiFERON-TB Gold Plus  Screening for tuberculosis - Future order for TB gold placed today. Plan: QuantiFERON-TB Gold Plus  Psoriasis: No active psoriasis at this time.  Primary osteoarthritis of both hands - X-rays of both hands were consistent with osteoarthritis on 07/30/2021.  He is not experiencing any increased discomfort in his hands at this time.  Contracture of joint of both elbows - X-rays of both elbows were obtained on 07/30/2021 which were consistent with inflammatory arthritis.  Bilateral flexion contractures in both elbows are unchanged.  Thickening of both elbows noted.  Tenderness along the right elbow joint line noted.  Primary osteoarthritis of both knees - X-rays of both knees were obtained on 07/30/2021 which were consistent with mild osteoarthritis and mild chondromalacia patella.  Chronic pain in both knee joints.  He has intermittent swelling in both knees.  On  examination today warmth in the left knee was noted.  Primary osteoarthritis of both feet - X-rays of both feet were consistent with osteoarthritic changes on 07/30/2021.  He has tenderness palpation over bilateral ankle joints, right greater than left.  Warmth and swelling of the  right ankle was noted.  Other medical conditions are listed as follows:  Late latent syphilis  HIV disease  History of MRSA infection  Cigarette smoker  Neuropathy  Pancytopenia: CBC with differential was within normal limits on 01/14/2023.    Orders: Orders Placed This Encounter  Procedures   CBC with Differential/Platelet   COMPLETE METABOLIC PANEL WITH GFR   QuantiFERON-TB Gold Plus   No orders of the defined types were placed in this encounter.   Follow-Up Instructions: Return in about 3 months (around 06/16/2023) for Rheumatoid arthritis, Osteoarthritis.   Gearldine Bienenstock, PA-C  Note - This record has been created using Dragon software.  Chart creation errors have been sought, but may not always  have been located. Such creation errors do not reflect on  the standard of medical care.

## 2023-03-06 ENCOUNTER — Other Ambulatory Visit (HOSPITAL_COMMUNITY): Payer: Self-pay

## 2023-03-10 ENCOUNTER — Other Ambulatory Visit: Payer: Self-pay

## 2023-03-14 ENCOUNTER — Other Ambulatory Visit (HOSPITAL_COMMUNITY): Payer: Self-pay

## 2023-03-17 ENCOUNTER — Ambulatory Visit: Payer: 59 | Attending: Physician Assistant | Admitting: Physician Assistant

## 2023-03-17 ENCOUNTER — Encounter: Payer: Self-pay | Admitting: Physician Assistant

## 2023-03-17 VITALS — BP 116/77 | HR 97 | Ht 73.0 in | Wt 209.8 lb

## 2023-03-17 DIAGNOSIS — F17210 Nicotine dependence, cigarettes, uncomplicated: Secondary | ICD-10-CM

## 2023-03-17 DIAGNOSIS — B20 Human immunodeficiency virus [HIV] disease: Secondary | ICD-10-CM

## 2023-03-17 DIAGNOSIS — Z79899 Other long term (current) drug therapy: Secondary | ICD-10-CM | POA: Diagnosis not present

## 2023-03-17 DIAGNOSIS — A528 Late syphilis, latent: Secondary | ICD-10-CM

## 2023-03-17 DIAGNOSIS — G629 Polyneuropathy, unspecified: Secondary | ICD-10-CM

## 2023-03-17 DIAGNOSIS — L409 Psoriasis, unspecified: Secondary | ICD-10-CM | POA: Diagnosis not present

## 2023-03-17 DIAGNOSIS — M19041 Primary osteoarthritis, right hand: Secondary | ICD-10-CM | POA: Diagnosis not present

## 2023-03-17 DIAGNOSIS — Z111 Encounter for screening for respiratory tuberculosis: Secondary | ICD-10-CM

## 2023-03-17 DIAGNOSIS — M19042 Primary osteoarthritis, left hand: Secondary | ICD-10-CM

## 2023-03-17 DIAGNOSIS — D61818 Other pancytopenia: Secondary | ICD-10-CM

## 2023-03-17 DIAGNOSIS — M19072 Primary osteoarthritis, left ankle and foot: Secondary | ICD-10-CM

## 2023-03-17 DIAGNOSIS — M24521 Contracture, right elbow: Secondary | ICD-10-CM

## 2023-03-17 DIAGNOSIS — M19071 Primary osteoarthritis, right ankle and foot: Secondary | ICD-10-CM

## 2023-03-17 DIAGNOSIS — M059 Rheumatoid arthritis with rheumatoid factor, unspecified: Secondary | ICD-10-CM

## 2023-03-17 DIAGNOSIS — Z8614 Personal history of Methicillin resistant Staphylococcus aureus infection: Secondary | ICD-10-CM

## 2023-03-17 DIAGNOSIS — M170 Bilateral primary osteoarthritis of knee: Secondary | ICD-10-CM

## 2023-03-17 DIAGNOSIS — M24522 Contracture, left elbow: Secondary | ICD-10-CM

## 2023-03-18 LAB — COMPLETE METABOLIC PANEL WITH GFR
AG Ratio: 1.1 (calc) (ref 1.0–2.5)
ALT: 26 U/L (ref 9–46)
AST: 16 U/L (ref 10–35)
Albumin: 4.2 g/dL (ref 3.6–5.1)
Alkaline phosphatase (APISO): 106 U/L (ref 35–144)
BUN: 13 mg/dL (ref 7–25)
CO2: 26 mmol/L (ref 20–32)
Calcium: 8.8 mg/dL (ref 8.6–10.3)
Chloride: 101 mmol/L (ref 98–110)
Creat: 0.97 mg/dL (ref 0.70–1.30)
Globulin: 3.7 g/dL (calc) (ref 1.9–3.7)
Glucose, Bld: 132 mg/dL — ABNORMAL HIGH (ref 65–99)
Potassium: 3.6 mmol/L (ref 3.5–5.3)
Sodium: 137 mmol/L (ref 135–146)
Total Bilirubin: 0.5 mg/dL (ref 0.2–1.2)
Total Protein: 7.9 g/dL (ref 6.1–8.1)
eGFR: 95 mL/min/{1.73_m2} (ref >=60)

## 2023-03-18 LAB — CBC WITH DIFFERENTIAL/PLATELET
Absolute Monocytes: 260 cells/uL (ref 200–950)
Basophils Absolute: 21 cells/uL (ref 0–200)
Basophils Relative: 0.4 %
Eosinophils Absolute: 88 cells/uL (ref 15–500)
Eosinophils Relative: 1.7 %
HCT: 45.3 % (ref 38.5–50.0)
Hemoglobin: 15.2 g/dL (ref 13.2–17.1)
Lymphs Abs: 1836 cells/uL (ref 850–3900)
MCH: 30.5 pg (ref 27.0–33.0)
MCHC: 33.6 g/dL (ref 32.0–36.0)
MCV: 91 fL (ref 80.0–100.0)
MPV: 9.2 fL (ref 7.5–12.5)
Monocytes Relative: 5 %
Neutro Abs: 2995 cells/uL (ref 1500–7800)
Neutrophils Relative %: 57.6 %
Platelets: 213 10*3/uL (ref 140–400)
RBC: 4.98 10*6/uL (ref 4.20–5.80)
RDW: 14.4 % (ref 11.0–15.0)
Total Lymphocyte: 35.3 %
WBC: 5.2 10*3/uL (ref 3.8–10.8)

## 2023-03-18 NOTE — Progress Notes (Signed)
Glucose is 132.  Rest of CMP WNL.  LFTs have returned to WNL.   Please forward results to pain management specialist as requested.  CBC WNL.

## 2023-03-31 ENCOUNTER — Other Ambulatory Visit (HOSPITAL_COMMUNITY): Payer: Self-pay

## 2023-04-08 ENCOUNTER — Other Ambulatory Visit (HOSPITAL_COMMUNITY): Payer: Self-pay

## 2023-04-08 ENCOUNTER — Other Ambulatory Visit: Payer: Self-pay

## 2023-04-08 ENCOUNTER — Other Ambulatory Visit: Payer: Self-pay | Admitting: Physician Assistant

## 2023-04-08 DIAGNOSIS — M059 Rheumatoid arthritis with rheumatoid factor, unspecified: Secondary | ICD-10-CM

## 2023-04-08 DIAGNOSIS — Z79899 Other long term (current) drug therapy: Secondary | ICD-10-CM

## 2023-04-08 MED ORDER — HUMIRA (2 PEN) 40 MG/0.4ML ~~LOC~~ AJKT
40.0000 mg | AUTO-INJECTOR | SUBCUTANEOUS | 0 refills | Status: DC
  Filled 2023-04-08: qty 2, 28d supply, fill #0
  Filled 2023-05-12: qty 2, 28d supply, fill #1
  Filled 2023-06-04 – 2023-06-12 (×2): qty 2, 28d supply, fill #2

## 2023-04-08 NOTE — Telephone Encounter (Signed)
Last Fill: 01/21/2023  Labs: 03/17/2023 Glucose is 132.  Rest of CMP WNL.  LFTs have returned to WNL.   CBC WNL.   TB Gold: 07/31/2022 Neg   Next Visit: 06/17/2023  Last Visit: 03/18/2023  [id number] rheumatoid arthritis   Current Dose per office note 03/17/2023: Humira 40 mg sq injections every 14 days.   Okay to refill Humira?

## 2023-04-15 ENCOUNTER — Other Ambulatory Visit: Payer: Self-pay

## 2023-04-15 ENCOUNTER — Other Ambulatory Visit (HOSPITAL_COMMUNITY): Payer: Self-pay

## 2023-04-16 ENCOUNTER — Telehealth: Payer: Self-pay

## 2023-04-16 NOTE — Telephone Encounter (Signed)
Per Rema Jasmine, current Prior Authorization is expiring.  Submitted a Prior Authorization request to [facility] for HUMIRA via CoverMyMeds. Will update once we receive a response.   Key: [facility]

## 2023-04-17 NOTE — Telephone Encounter (Signed)
Received notification from [facility] regarding a prior authorization for HUMIRA. Authorization has been APPROVED from 04/16/23 to 12/02/23. Approval letter sent to scan center.  Patient can continue to fill through [facility]: [phone]   Authorization # [id number] Phone # [phone]  Theirgy updated  Chesley Mires, PharmD, MPH, BCPS, CPP Clinical Pharmacist (Rheumatology and Pulmonology)

## 2023-05-12 ENCOUNTER — Other Ambulatory Visit (HOSPITAL_COMMUNITY): Payer: Self-pay

## 2023-05-14 ENCOUNTER — Other Ambulatory Visit: Payer: Self-pay

## 2023-05-14 NOTE — Progress Notes (Signed)
The 10-year ASCVD risk score (Arnett DK, et al., 2019) is: 18.1%   Values used to calculate the score:     Age: 52 years     Sex: Male     Is Non-Hispanic African American: No     Diabetic: Yes     Tobacco smoker: Yes     Systolic Blood Pressure: 116 mmHg     Is BP treated: No     HDL Cholesterol: 23 mg/dL     Total Cholesterol: 146 mg/dL  Sandie Ano, RN

## 2023-06-03 NOTE — Progress Notes (Signed)
Office Visit Note  Patient: Patrick Huber             Date of Birth: [birth year]           MRN: [id number]             PCP: Oneita Hurt, No Referring: Cliffton Asters, MD Visit Date: 06/17/2023 Occupation: @GUAROCC @  Subjective:  Pain in multiple joints   History of Present Illness: Patrick Huber is a 52 y.o. male with history of seropositive rheumatoid arthritis and osteoarthritis. Patient remains on Humira 40 mg sq injections every 14 days.  Patient denies any side effects or injection site reactions with Humira.  He denies any recent or recurrent infections.  He had a recent follow-up visit Dr[facility] Earle Gell at [facility] and will be following up on a yearly basis.  He remains on biktarvy as prescribed.   Patient states that he continues to have breakthrough symptoms on day 10 prior to injecting Humira on day 14.  He has been experiencing swelling in both knees and both ankle joints and has had increased discomfort in his elbow joints and both hips.  Patient states that his pain, swelling, and stiffness are worse first thing in the morning and have been lasting for several hours.  He is due for his Humira dose today and is having increased discomfort.  He states that he is unable to take ibuprofen due to GI upset.  He was previously under the care of pain management and was receiving hydrocodone for pain relief along with diclofenac 50 mg 2 tablets daily.  He has been out of his prescription for hydrocodone for the past 2 weeks and has been out of diclofenac for the past 3 to 4 days.  Patient states that he was out of town for a funeral in [place] and was unable to come to the clinic for a pill count so his prescriptions will no longer be refilled.   Activities of Daily Living:  Patient reports morning stiffness for several hours.   Patient Reports nocturnal pain.  Difficulty dressing/grooming: Denies Difficulty climbing stairs: Reports Difficulty getting out of chair: Reports Difficulty using hands  for taps, buttons, cutlery, and/or writing: Denies  Review of Systems  Constitutional:  Positive for fatigue.  HENT:  Negative for mouth sores and mouth dryness.   Eyes:  Negative for dryness.  Respiratory:  Negative for shortness of breath.   Cardiovascular:  Negative for chest pain and palpitations.  Gastrointestinal:  Negative for blood in stool, constipation and diarrhea.  Endocrine: Negative for increased urination.  Genitourinary:  Negative for involuntary urination.  Musculoskeletal:  Positive for joint pain, gait problem, joint pain, joint swelling, myalgias, muscle weakness, morning stiffness and myalgias. Negative for muscle tenderness.  Skin:  Negative for color change, rash, hair loss and sensitivity to sunlight.  Allergic/Immunologic: Negative for susceptible to infections.  Neurological:  Positive for dizziness. Negative for headaches.  Hematological:  Negative for swollen glands.  Psychiatric/Behavioral:  Negative for depressed mood and sleep disturbance. The patient is not nervous/anxious.     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   Malnutrition of moderate degree 08/18/2018   HIV disease (HCC) 08/18/2018   Unintentional weight loss 08/18/2018   Rheumatoid arthritis (HCC) 08/18/2018   Knee mass, right 08/18/2018   Pancytopenia (HCC) 08/18/2018   Cigarette smoker 08/18/2018   GERD (gastroesophageal reflux disease)  08/18/2018   Psoriasis 08/18/2018   History of MRSA infection 08/18/2018   Neoplasm 06/21/2018   Mass 04/08/2018    Past Medical History:  Diagnosis Date   Angina pectoris (HCC)    HIV (human immunodeficiency virus infection) (HCC)    Seropositive rheumatoid arthritis (HCC)     Family History  Problem Relation Age of Onset   Heart disease Mother    Cancer Mother    Cancer Father    Heart disease Father    Cancer Sister    Heart disease Brother    Healthy Son    Healthy Daughter    Past Surgical History:  Procedure Laterality Date   KNEE ARTHROSCOPY Left    Right knee surgery     [facility] - at [facility]   Social History   Social History Narrative   Not on file   Immunization History  Administered Date(s) Administered   Influenza,inj,Quad PF,6+ Mos 09/02/2019     Objective: Vital Signs: BP 118/80 (BP Location: Left Arm, Patient Position: Sitting, Cuff Size: Normal)   Pulse 89   Resp 18   Ht 6\' 1"  (1.854 m)   Wt 211 lb 6.4 oz (95.9 kg)   BMI 27.89 kg/m    Physical Exam Vitals and nursing note reviewed.  Constitutional:      Appearance: He is well-developed.  HENT:     Head: Normocephalic and atraumatic.  Eyes:     Conjunctiva/sclera: Conjunctivae normal.     Pupils: Pupils are equal, round, and reactive to light.  Cardiovascular:     Rate and Rhythm: Normal rate and regular rhythm.     Heart sounds: Normal heart sounds.  Pulmonary:     Effort: Pulmonary effort is normal.     Breath sounds: Normal breath sounds.  Abdominal:     General: Bowel sounds are normal.     Palpations: Abdomen is soft.  Musculoskeletal:     Cervical back: Normal range of motion and neck supple.  Skin:    General: Skin is warm and dry.     Capillary Refill: Capillary refill takes less than 2 seconds.  Neurological:     Mental Status: He is alert and oriented to person, place, and time.  Psychiatric:        Behavior: Behavior normal.      Musculoskeletal Exam: C-spine has limited  range of motion with lateral rotation.  Painful limited range of motion of both shoulder joints with limited abduction to about 100 degrees.  Painful and limited internal rotation of both shoulders.  Flexion contractures of both elbows noted.  Tenderness and thickening along the elbow joint line bilaterally.  Warmth of the left elbow noted.  Wrist joints have good range of motion with some tenderness of the left wrist joint.  No tenderness or synovitis over MCPs.  PIP and DIP thickening.  Limited and painful ROM of both hips.  Painful ROM of both knees with limited extension and difficulty walking. Warmth in both knees noted, left worse than right.  Tenderness and swelling of the right ankle joint.   CDAI Exam: CDAI Score:  17  Patient Global: 70 / 100; Provider Global: 50 / 100 Swollen: 2 ; Tender: 5  Joint Exam 06/17/2023      Right  Left  Elbow      Tender  Wrist      Tender  Knee   Tender  Swollen Tender  Ankle  Swollen Tender        Investigation: No additional findings.  Imaging: No results found.  Recent Labs: Lab Results  Component Value Date   WBC 5.2 03/17/2023   HGB 15.2 03/17/2023   PLT 213 03/17/2023   NA 137 03/17/2023   K 3.6 03/17/2023   CL 101 03/17/2023   CO2 26 03/17/2023   GLUCOSE 132 (H) 03/17/2023   BUN 13 03/17/2023   CREATININE 0.97 03/17/2023   BILITOT 0.5 03/17/2023   ALKPHOS 168 (H) 08/17/2018   AST 16 03/17/2023   ALT 26 03/17/2023   PROT 7.9 03/17/2023   ALBUMIN 2.2 (L) 08/17/2018   CALCIUM 8.8 03/17/2023   GFRAA 129 12/28/2018   QFTBGOLDPLUS NEGATIVE 07/31/2022    Speciality Comments: Enbrel stopped 08/04/22 Humira started 08/12/22  Procedures:  No procedures performed Allergies: Meperidine and Meperidine hcl   Assessment / Plan:     Visit Diagnoses: Seropositive rheumatoid arthritis (HCC) - - Anti-CCP>250, RF 20, ESR 72, Bilateral knee swelling, bilateral elbow joint contractures, +synovitis: Patient presents today with ongoing pain and  inflammation involving multiple joints.  He has been having recurrent flares on day 10 prior to injecting Humria on day 14.  Patient experiences swelling in both ankles and both knee joints several days prior to his Humira dose being due.  He has also had increased pain in both hip joints and both elbow joints.  His morning stiffness has been lasting several hours.  Patient was previously going to pain management and was receiving hydrocodone and diclofenac 50 mg 1 tablet twice daily for pain relief.  He is no longer following up at the pain management clinic and has been off of hydrocodone for the past 2 weeks and diclofenac for the past 3 to 4 days.  He is experiencing increased breakthrough symptoms.  A new referral to pain management will be placed today.  To help better manage his ongoing inflammation different treatment options were discussed today in detail.  Indications, contraindications, and potential side effects of Plaquenil were reviewed today.  All questions were addressed and consent was obtained.  Plan to initiate Plaquenil 200 mg 1 tablet by mouth twice daily pending lab results.  He will remain on Humira as combination therapy.  He is apprehensive to switch Humira to another medication at this time.  He is not a good candidate for methotrexate/Arava given history of elevated LFTs.  Apprehensive to suppress his immune system too much given history of latent syphilis and HIV.  He has not had any recent or recurrent infections while on Humira. He will follow-up in the office in 8 weeks to assess his response to Plaquenil in combination with Humira.  Patient was counseled on the purpose, proper use, and adverse effects of hydroxychloroquine including nausea/diarrhea, skin rash, headaches, and sun sensitivity.  Advised patient to wear sunscreen once starting hydroxychloroquine to reduce risk of rash associated with sun sensitivity.  Discussed importance of annual eye exams while on hydroxychloroquine  to monitor to ocular toxicity and discussed importance of frequent laboratory monitoring.  Provided patient with eye exam form for baseline ophthalmologic exam.  Reviewed risk for QTC prolongation when used in combination  with other QTc prolonging agents (including but not limited to antiarrhythmics, macrolide antibiotics, flouroquinolones, tricyclic antidepressants, citalopram, specific antipsychotics, ondansetron, migraine triptans, and methadone). Provided patient with educational materials on hydroxychloroquine and answered all questions.  Patient consented to hydroxychloroquine. Will upload consent in the media tab.    Dose will be Plaquenil 200 mg twice daily.  Prescription pending lab results.  High risk medication use - Humira 40 mg sq injections every 14 days.  Inadequate response to Enbrel 50 mg sq injections once weekly. Patient initiated Humira on 08/12/2022. Plan to add on Plaquenil 200 mg 1 tablet by mouth twice daily pending lab results today. CBC and CMP updated on 03/17/23. Orders for CBC and CMP released today.  He will require updated lab work in 1 month then every 3 months.  TB Gold negative on 07/31/22.  Future order for TB Gold remains in place. Discussed the importance of holding humira if he develops signs or symptoms of an infection and to resume once the infection has completely cleared.  Referral to ophthalmology will be placed for a baseline Plaquenil eye examination.  He was given a Plaquenil eye exam form to take with him to his appointment.  - Plan: CBC with Differential/Platelet, COMPLETE METABOLIC PANEL WITH GFR  Psoriasis: No active psoriasis at this time.   Primary osteoarthritis of both hands - X-rays of both hands were consistent with osteoarthritis on 07/30/2021.  PIP and DIP thickening noted.  No synovitis of MCP joints noted.  Tenderness of the left wrist joint noted.  Contracture of joint of both elbows - X-rays of both elbows were obtained on 07/30/2021 which were  consistent with inflammatory arthritis.  Bilateral flexion contractures in both elbows- unchanged.  Patient continues to have chronic pain in both elbows especially the left elbow.  Warmth and tenderness of the left elbow noted today.  Primary osteoarthritis of both knees - X-rays of both knees were obtained on 07/30/2021 which were consistent with mild osteoarthritis and mild chondromalacia patella.  Patient has a history of chronic pain in both knee joints, left greater than right.  Slightly limited extension.  Warmth in the left knee.    Primary osteoarthritis of both feet - X-rays of both feet were consistent with osteoarthritic changes on 07/30/2021.  Patient has ongoing pain in both ankle joints.  Tenderness and synovitis of the right ankle noted on examination today.  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   HIV disease [facility]): Under care of Dr. Luciana Axe.  Patient remains on Biktarvy as prescribed.   History of MRSA infection  Cigarette smoker  Neuropathy  Pancytopenia (HCC)  Orders: Orders Placed This Encounter  Procedures   CBC with Differential/Platelet   COMPLETE METABOLIC PANEL WITH GFR   Ambulatory referral to Ophthalmology   Ambulatory referral to Pain Clinic   No orders of the defined types were placed in this encounter.    Follow-Up Instructions: Return in about 8 weeks (around 08/12/2023).   Gearldine Bienenstock, PA-C  Note - This record has been created using Dragon software.  Chart creation errors have been sought, but may not always  have been located. Such creation errors do not reflect on  the standard of medical care.

## 2023-06-04 ENCOUNTER — Other Ambulatory Visit: Payer: Self-pay

## 2023-06-04 ENCOUNTER — Other Ambulatory Visit (HOSPITAL_COMMUNITY): Payer: Self-pay

## 2023-06-10 ENCOUNTER — Other Ambulatory Visit: Payer: Self-pay

## 2023-06-10 ENCOUNTER — Ambulatory Visit: Payer: 59 | Admitting: Internal Medicine

## 2023-06-10 ENCOUNTER — Encounter: Payer: Self-pay | Admitting: Internal Medicine

## 2023-06-10 VITALS — BP 130/83 | HR 92 | Resp 16 | Ht 73.0 in | Wt 209.8 lb

## 2023-06-10 DIAGNOSIS — B20 Human immunodeficiency virus [HIV] disease: Secondary | ICD-10-CM

## 2023-06-10 DIAGNOSIS — F17210 Nicotine dependence, cigarettes, uncomplicated: Secondary | ICD-10-CM

## 2023-06-10 DIAGNOSIS — A528 Late syphilis, latent: Secondary | ICD-10-CM | POA: Diagnosis not present

## 2023-06-10 DIAGNOSIS — Z79899 Other long term (current) drug therapy: Secondary | ICD-10-CM

## 2023-06-10 MED ORDER — BIKTARVY 50-200-25 MG PO TABS: 1.0000 | ORAL_TABLET | Freq: Every day | ORAL | 11 refills | Status: DC

## 2023-06-10 NOTE — Assessment & Plan Note (Signed)
Will check his lipid panel 

## 2023-06-10 NOTE — Assessment & Plan Note (Signed)
Will recheck his titer

## 2023-06-10 NOTE — Progress Notes (Signed)
   Subjective:    Patient ID: Patrick Huber, male    DOB: 02-05-1971, 52 y.o.   MRN: [id number]  HPI Rithik is here for follow up of HIV He continues on [place] with no missed doses.  Here for his yearly follow up.  He is divorced x 2 years.  No sexual acitivity since that time.  He had seen a PCP but looking for a new one now.  Started initially on insulin but became hypoglycemia and stopped.  Had a colonoscopy two years ago.     Review of Systems  Constitutional:  Negative for fatigue.  Gastrointestinal:  Negative for diarrhea and nausea.  Skin:  Negative for rash.       Objective:   Physical Exam Eyes:     General: No scleral icterus. Pulmonary:     Effort: Pulmonary effort is normal.  Neurological:     Mental Status: He is alert.   SH: + tobacco        Assessment & Plan:

## 2023-06-10 NOTE — Assessment & Plan Note (Addendum)
He continues to do well on biktarvy and no changes indicated.  Refills provided.   Discussed Reprieve and he is higher risk as a diabetic and smoker.  I have enouraged him to establish with a pcp Rtc in 1 year.

## 2023-06-10 NOTE — Assessment & Plan Note (Signed)
Discussed cessation 

## 2023-06-11 ENCOUNTER — Other Ambulatory Visit (HOSPITAL_COMMUNITY): Payer: Self-pay

## 2023-06-11 LAB — T-HELPER CELL (CD4) - (RCID CLINIC ONLY)
CD4 % Helper T Cell: 15 % — ABNORMAL LOW (ref 33–65)
CD4 T Cell Abs: 304 /uL — ABNORMAL LOW (ref 400–1790)

## 2023-06-12 ENCOUNTER — Other Ambulatory Visit: Payer: Self-pay

## 2023-06-12 ENCOUNTER — Other Ambulatory Visit (HOSPITAL_COMMUNITY): Payer: Self-pay

## 2023-06-13 LAB — RPR: RPR Ser Ql: REACTIVE — AB

## 2023-06-13 LAB — LIPID PANEL
Cholesterol: 154 mg/dL (ref <200)
HDL: 26 mg/dL — ABNORMAL LOW (ref >=40)
LDL Cholesterol (Calc): 81 mg/dL (calc)
Non-HDL Cholesterol (Calc): 128 mg/dL (calc) (ref <130)
Total CHOL/HDL Ratio: 5.9 (calc) — ABNORMAL HIGH (ref <5.0)
Triglycerides: 387 mg/dL — ABNORMAL HIGH (ref <150)

## 2023-06-13 LAB — RPR TITER: RPR Titer: 1:16 {titer} — ABNORMAL HIGH

## 2023-06-13 LAB — HIV-1 RNA QUANT-NO REFLEX-BLD
HIV 1 RNA Quant: <20 Copies/mL — ABNORMAL HIGH
HIV-1 RNA Quant, Log: <1.3 Log cps/mL — ABNORMAL HIGH

## 2023-06-13 LAB — T PALLIDUM AB: T Pallidum Abs: POSITIVE — AB

## 2023-06-16 ENCOUNTER — Other Ambulatory Visit (HOSPITAL_COMMUNITY): Payer: Self-pay

## 2023-06-17 ENCOUNTER — Encounter: Payer: Self-pay | Admitting: Physician Assistant

## 2023-06-17 ENCOUNTER — Ambulatory Visit: Payer: Medicaid Other | Admitting: Internal Medicine

## 2023-06-17 ENCOUNTER — Ambulatory Visit: Payer: 59 | Attending: Physician Assistant | Admitting: Physician Assistant

## 2023-06-17 ENCOUNTER — Other Ambulatory Visit: Payer: Self-pay

## 2023-06-17 VITALS — BP 118/80 | HR 89 | Resp 18 | Ht 73.0 in | Wt 211.4 lb

## 2023-06-17 DIAGNOSIS — L409 Psoriasis, unspecified: Secondary | ICD-10-CM

## 2023-06-17 DIAGNOSIS — F17210 Nicotine dependence, cigarettes, uncomplicated: Secondary | ICD-10-CM

## 2023-06-17 DIAGNOSIS — B20 Human immunodeficiency virus [HIV] disease: Secondary | ICD-10-CM

## 2023-06-17 DIAGNOSIS — A528 Late syphilis, latent: Secondary | ICD-10-CM

## 2023-06-17 DIAGNOSIS — Z8614 Personal history of Methicillin resistant Staphylococcus aureus infection: Secondary | ICD-10-CM

## 2023-06-17 DIAGNOSIS — M170 Bilateral primary osteoarthritis of knee: Secondary | ICD-10-CM

## 2023-06-17 DIAGNOSIS — M24522 Contracture, left elbow: Secondary | ICD-10-CM

## 2023-06-17 DIAGNOSIS — M19071 Primary osteoarthritis, right ankle and foot: Secondary | ICD-10-CM

## 2023-06-17 DIAGNOSIS — Z79899 Other long term (current) drug therapy: Secondary | ICD-10-CM

## 2023-06-17 DIAGNOSIS — M059 Rheumatoid arthritis with rheumatoid factor, unspecified: Secondary | ICD-10-CM

## 2023-06-17 DIAGNOSIS — M19072 Primary osteoarthritis, left ankle and foot: Secondary | ICD-10-CM

## 2023-06-17 DIAGNOSIS — M19041 Primary osteoarthritis, right hand: Secondary | ICD-10-CM | POA: Diagnosis not present

## 2023-06-17 DIAGNOSIS — M19042 Primary osteoarthritis, left hand: Secondary | ICD-10-CM

## 2023-06-17 DIAGNOSIS — G629 Polyneuropathy, unspecified: Secondary | ICD-10-CM

## 2023-06-17 DIAGNOSIS — M24521 Contracture, right elbow: Secondary | ICD-10-CM

## 2023-06-17 DIAGNOSIS — D61818 Other pancytopenia: Secondary | ICD-10-CM

## 2023-06-17 LAB — CBC WITH DIFFERENTIAL/PLATELET
Hemoglobin: 14.4 g/dL (ref 13.2–17.1)
MCHC: 34.2 g/dL (ref 32.0–36.0)
MPV: 9.3 fL (ref 7.5–12.5)
Neutrophils Relative %: 54.4 %
Platelets: 212 10*3/uL (ref 140–400)
RDW: 14.4 % (ref 11.0–15.0)

## 2023-06-17 NOTE — Telephone Encounter (Signed)
Pending lab results, patient will be starting plaquenil per Sherron Ales, PA-C.   Consent obtained and sent to the scan center. Thanks!

## 2023-06-17 NOTE — Patient Instructions (Signed)
Standing Labs We placed an order today for your standing lab work.   Please have your standing labs drawn in 1 month then every 3 months   Please have your labs drawn 2 weeks prior to your appointment so that the provider can discuss your lab results at your appointment, if possible.  Please note that you may see your imaging and lab results in MyChart before we have reviewed them. We will contact you once all results are reviewed. Please allow our office up to 72 hours to thoroughly review all of the results before contacting the office for clarification of your results.  WALK-IN LAB HOURS  Monday through Thursday from 8:00 am -12:30 pm and 1:00 pm-5:00 pm and Friday from 8:00 am-12:00 pm.  Patients with office visits requiring labs will be seen before walk-in labs.  You may encounter longer than normal wait times. Please allow additional time. Wait times may be shorter on  Monday and Thursday afternoons.  We do not book appointments for walk-in labs. We appreciate your patience and understanding with our staff.   Labs are drawn by [facility]. Please bring your co-pay at the time of your lab draw.  You may receive a bill from [facility] for your lab work.  Please note if you are on Hydroxychloroquine and and an order has been placed for a Hydroxychloroquine level,  you will need to have it drawn 4 hours or more after your last dose.  If you wish to have your labs drawn at another location, please call the office 24 hours in advance so we can fax the orders.  The office is located at [address], [place], [place] [address]   If you have any questions regarding directions or hours of operation,  please call [phone].   As a reminder, please drink plenty of water prior to coming for your lab work. Thanks!  Hydroxychloroquine Tablets What is this medication? HYDROXYCHLOROQUINE (hye drox ee KLOR oh kwin) treats autoimmune conditions, such as rheumatoid arthritis and lupus. It  works by slowing down an overactive immune system. It may also be used to prevent and treat malaria. It works by killing the parasite that causes malaria. It belongs to a group of medications called DMARDs. This medicine may be used for other purposes; ask your health care provider or pharmacist if you have questions. COMMON BRAND NAME(S): Plaquenil, Quineprox What should I tell my care team before I take this medication? They need to know if you have any of these conditions: Diabetes Eye disease, vision problems Frequently drink alcohol G6PD deficiency Heart disease Irregular heartbeat or rhythm Kidney disease Liver disease Porphyria Psoriasis An unusual or allergic reaction to hydroxychloroquine, other medications, foods, dyes, or preservatives Pregnant or trying to get pregnant Breastfeeding How should I use this medication? Take this medication by mouth with water. Take it as directed on the prescription label. Do not cut, crush, or chew this medication. Swallow the tablets whole. Take it with food. Do not take it more than directed. Take all of this medication unless your care team tells you to stop it early. Keep taking it even if you think you are better. Take products with antacids in them at a different time of day than this medication. Take this medication 4 hours before or 4 hours after antacids. Talk to your care team if you have questions. Talk to your care team about the use of this medication in children. While this medication may be prescribed for selected conditions,  precautions do apply. Overdosage: If you think you have taken too much of this medicine contact a poison control center or emergency room at once. NOTE: This medicine is only for you. Do not share this medicine with others. What if I miss a dose? If you miss a dose, take it as soon as you can. If it is almost time for your next dose, take only that dose. Do not take double or extra doses. What may interact with  this medication? Do not take this medication with any of the following: Cisapride Dronedarone Pimozide Thioridazine This medication may also interact with the following: Ampicillin Antacids Cimetidine Cyclosporine Digoxin Kaolin Medications for diabetes, such as insulin, glipizide, glyburide Medications for seizures, such as carbamazepine, phenobarbital, phenytoin Mefloquine Methotrexate Other medications that cause heart rhythm changes Praziquantel This list may not describe all possible interactions. Give your health care provider a list of all the medicines, herbs, non-prescription drugs, or dietary supplements you use. Also tell them if you smoke, drink alcohol, or use illegal drugs. Some items may interact with your medicine. What should I watch for while using this medication? Visit your care team for regular checks on your progress. Tell your care team if your symptoms do not start to get better or if they get worse. You may need blood work done while you are taking this medication. If you take other medications that can affect heart rhythm, you may need more testing. Talk to your care team if you have questions. Your vision may be tested before and during use of this medication. Tell your care team right away if you have any change in your eyesight. This medication may cause serious skin reactions. They can happen weeks to months after starting the medication. Contact your care team right away if you notice fevers or flu-like symptoms with a rash. The rash may be red or purple and then turn into blisters or peeling of the skin. Or, you might notice a red rash with swelling of the face, lips or lymph nodes in your neck or under your arms. If you or your family notice any changes in your behavior, such as new or worsening depression, thoughts of harming yourself, anxiety, or other unusual or disturbing thoughts, or memory loss, call your care team right away. What side effects may I  notice from receiving this medication? Side effects that you should report to your care team as soon as possible: Allergic reactions--skin rash, itching, hives, swelling of the face, lips, tongue, or throat Aplastic anemia--unusual weakness or fatigue, dizziness, headache, trouble breathing, increased bleeding or bruising Change in vision Heart rhythm changes--fast or irregular heartbeat, dizziness, feeling faint or lightheaded, chest pain, trouble breathing Infection--fever, chills, cough, or sore throat Low blood sugar (hypoglycemia)--tremors or shaking, anxiety, sweating, cold or clammy skin, confusion, dizziness, rapid heartbeat Muscle injury--unusual weakness or fatigue, muscle pain, dark yellow or brown urine, decrease in amount of urine Pain, tingling, or numbness in the hands or feet Rash, fever, and swollen lymph nodes Redness, blistering, peeling, or loosening of the skin, including inside the mouth Thoughts of suicide or self-harm, worsening mood, or feelings of depression Unusual bruising or bleeding Side effects that usually do not require medical attention (report to your care team if they continue or are bothersome): Diarrhea Headache Nausea Stomach pain Vomiting This list may not describe all possible side effects. Call your doctor for medical advice about side effects. You may report side effects to FDA at 1-800-FDA-1088. Where should I keep  my medication? Keep out of the reach of children and pets. Store at room temperature up to 30 degrees C (86 degrees F). Protect from light. Get rid of any unused medication after the expiration date. To get rid of medications that are no longer needed or have expired: Take the medication to a medication take-back program. Check with your pharmacy or law enforcement to find a location. If you cannot return the medication, check the label or package insert to see if the medication should be thrown out in the garbage or flushed down the  toilet. If you are not sure, ask your care team. If it is safe to put it in the trash, empty the medication out of the container. Mix the medication with cat litter, dirt, coffee grounds, or other unwanted substance. Seal the mixture in a bag or container. Put it in the trash. NOTE: This sheet is a summary. It may not cover all possible information. If you have questions about this medicine, talk to your doctor, pharmacist, or health care provider.  2024 Elsevier/Gold Standard (2022-05-27 00:00:00)

## 2023-06-18 ENCOUNTER — Other Ambulatory Visit (HOSPITAL_COMMUNITY): Payer: Self-pay

## 2023-06-18 LAB — CBC WITH DIFFERENTIAL/PLATELET
Absolute Monocytes: 269 cells/uL (ref 200–950)
Basophils Absolute: 22 cells/uL (ref 0–200)
Basophils Relative: 0.4 %
Eosinophils Absolute: 39 cells/uL (ref 15–500)
Eosinophils Relative: 0.7 %
HCT: 42.1 % (ref 38.5–50.0)
Lymphs Abs: 2223 cells/uL (ref 850–3900)
MCH: 31.9 pg (ref 27.0–33.0)
MCV: 93.1 fL (ref 80.0–100.0)
Monocytes Relative: 4.8 %
Neutro Abs: 3046 cells/uL (ref 1500–7800)
RBC: 4.52 10*6/uL (ref 4.20–5.80)
Total Lymphocyte: 39.7 %
WBC: 5.6 10*3/uL (ref 3.8–10.8)

## 2023-06-18 LAB — COMPLETE METABOLIC PANEL WITH GFR
AG Ratio: 1.2 (calc) (ref 1.0–2.5)
ALT: 26 U/L (ref 9–46)
AST: 16 U/L (ref 10–35)
Albumin: 4.3 g/dL (ref 3.6–5.1)
Alkaline phosphatase (APISO): 97 U/L (ref 35–144)
BUN: 11 mg/dL (ref 7–25)
CO2: 32 mmol/L (ref 20–32)
Calcium: 8.7 mg/dL (ref 8.6–10.3)
Chloride: 100 mmol/L (ref 98–110)
Creat: 1.11 mg/dL (ref 0.70–1.30)
Globulin: 3.5 g/dL (calc) (ref 1.9–3.7)
Glucose, Bld: 147 mg/dL — ABNORMAL HIGH (ref 65–99)
Potassium: 3.9 mmol/L (ref 3.5–5.3)
Sodium: 137 mmol/L (ref 135–146)
Total Bilirubin: 0.4 mg/dL (ref 0.2–1.2)
Total Protein: 7.8 g/dL (ref 6.1–8.1)
eGFR: 80 mL/min/{1.73_m2} (ref >=60)

## 2023-06-18 MED ORDER — HYDROXYCHLOROQUINE SULFATE 200 MG PO TABS: 200.0000 mg | ORAL_TABLET | Freq: Two times a day (BID) | ORAL | 2 refills | Status: DC

## 2023-06-18 NOTE — Progress Notes (Signed)
Glucose is 147. Rest of CMP WNL.  CBC WNL.

## 2023-06-18 NOTE — Telephone Encounter (Signed)
Glucose is 147. Rest of CMP WNL.  CBC WNL.   Per office note on 06/17/2023: Dose will be Plaquenil 200 mg twice daily.

## 2023-07-04 ENCOUNTER — Other Ambulatory Visit: Payer: Self-pay

## 2023-07-07 ENCOUNTER — Other Ambulatory Visit: Payer: Self-pay | Admitting: Physician Assistant

## 2023-07-07 ENCOUNTER — Other Ambulatory Visit (HOSPITAL_COMMUNITY): Payer: Self-pay

## 2023-07-07 DIAGNOSIS — Z79899 Other long term (current) drug therapy: Secondary | ICD-10-CM

## 2023-07-07 DIAGNOSIS — M059 Rheumatoid arthritis with rheumatoid factor, unspecified: Secondary | ICD-10-CM

## 2023-07-07 MED ORDER — HUMIRA (2 PEN) 40 MG/0.4ML ~~LOC~~ AJKT
40.0000 mg | AUTO-INJECTOR | SUBCUTANEOUS | 0 refills | Status: DC
  Filled 2023-07-07: qty 2, 28d supply, fill #0
  Filled 2023-07-29: qty 2, 28d supply, fill #1
  Filled 2023-08-28: qty 2, 28d supply, fill #2

## 2023-07-07 NOTE — Telephone Encounter (Signed)
Last Fill: 04/08/2023  Labs: 06/17/2023 Glucose is 147. Rest of CMP WNL. CBC WNL.  TB Gold: 07/31/2022  TB gold negative   Next Visit: 08/18/2023  Last Visit: 06/17/2023  YH:[id number] rheumatoid arthritis   Current Dose per office note 06/17/2023: Humira 40 mg sq injections every 14 days.   Okay to refill Humira?

## 2023-07-08 ENCOUNTER — Other Ambulatory Visit: Payer: Self-pay

## 2023-07-25 ENCOUNTER — Other Ambulatory Visit: Payer: Self-pay | Admitting: Internal Medicine

## 2023-07-25 ENCOUNTER — Telehealth: Payer: Self-pay

## 2023-07-25 DIAGNOSIS — G629 Polyneuropathy, unspecified: Secondary | ICD-10-CM

## 2023-07-25 MED ORDER — GABAPENTIN 300 MG PO CAPS
ORAL_CAPSULE | ORAL | 11 refills | Status: DC
Start: 2023-07-25 — End: 2024-08-12

## 2023-07-25 NOTE — Telephone Encounter (Signed)
Received refill request for gabapentin 300 mg capsule. Will forward message to MD to advise on refill. Juanita Laster, RMA

## 2023-07-29 ENCOUNTER — Other Ambulatory Visit: Payer: Self-pay

## 2023-08-05 NOTE — Progress Notes (Signed)
Office Visit Note  Patient: Patrick Huber             Date of Birth: 30-Dec-1970           MRN: [id number]             PCP: Pcp, No Referring: No ref. provider found Visit Date: 08/18/2023 Occupation: @GUAROCC @  Subjective:  Pain in multiple joints  History of Present Illness: Patrick Huber is a 52 y.o. male with history of seropositive arthritis and osteoarthritis.  Patient is currently on Humira 40 mg sq injections every 14 days.  He is tolerating Humira without any side effects or injection site reactions.  He has not had any gaps in therapy recently.  Patient states that he tried taking Plaquenil for 1 week but discontinued due to gastrointestinal cramping and diarrhea.  He does not want to resume Plaquenil at this time.  He was taking leftover prescription for diclofenac 50 mg 1 tablet twice daily as needed for pain relief which was helpful.  He has requested a refill of diclofenac today.  Patient states that he tried establishing care with a new pain management specialist but they only offered steroid injections and would not refill his diclofenac or hydrocodone.  His pain levels have been inadequately controlled.  He currently rates his pain a 10 out of 10.  He has difficulty ambulating due to severity of pain in his knee joints and ankle joints.  He has been out of diclofenac for 1 week.    Activities of Daily Living:  Patient reports morning stiffness for 2 hours.   Patient Reports nocturnal pain.  Difficulty dressing/grooming: Denies Difficulty climbing stairs: Reports Difficulty getting out of chair: Reports Difficulty using hands for taps, buttons, cutlery, and/or writing: Reports  Review of Systems  Constitutional:  Negative for fatigue.  HENT:  Negative for mouth sores and mouth dryness.   Eyes:  Negative for dryness.  Respiratory:  Negative for shortness of breath.   Cardiovascular:  Negative for chest pain and palpitations.  Gastrointestinal:  Negative for blood  in stool, constipation and diarrhea.  Endocrine: Negative for increased urination.  Genitourinary:  Negative for involuntary urination.  Musculoskeletal:  Positive for joint pain, gait problem, joint pain, joint swelling, myalgias, muscle weakness, morning stiffness, muscle tenderness and myalgias.  Skin:  Negative for color change, rash, hair loss and sensitivity to sunlight.  Allergic/Immunologic: Negative for susceptible to infections.  Neurological:  Negative for dizziness and headaches.  Hematological:  Negative for swollen glands.  Psychiatric/Behavioral:  Positive for sleep disturbance. Negative for depressed mood. The patient is not nervous/anxious.     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   Malnutrition of moderate degree 08/18/2018   HIV disease (HCC) 08/18/2018   Unintentional weight loss 08/18/2018   Rheumatoid arthritis (HCC) 08/18/2018   Knee mass, right 08/18/2018   Pancytopenia (HCC) 08/18/2018   Cigarette smoker 08/18/2018   GERD (gastroesophageal reflux disease) 08/18/2018   Psoriasis 08/18/2018  History of MRSA infection 08/18/2018   Neoplasm 06/21/2018   Mass 04/08/2018    Past Medical History:  Diagnosis Date   Angina pectoris (HCC)    HIV (human immunodeficiency virus infection) (HCC)    Seropositive rheumatoid arthritis (HCC)     Family History  Problem Relation Age of Onset   Heart disease Mother     Cancer Mother    Cancer Father    Heart disease Father    Cancer Sister    Heart disease Brother    Healthy Son    Healthy Daughter    Past Surgical History:  Procedure Laterality Date   KNEE ARTHROSCOPY Left    Right knee surgery     Mass - at [facility]   Social History   Social History Narrative   Not on file   Immunization History  Administered Date(s) Administered   Influenza,inj,Quad PF,6+ Mos 09/02/2019     Objective: Vital Signs: BP 127/79 (BP Location: Left Arm, Patient Position: Sitting, Cuff Size: Normal)   Pulse 81   Resp 14   Ht 6\' 1"  (1.854 m)   Wt 211 lb (95.7 kg)   BMI 27.84 kg/m    Physical Exam Vitals and nursing note reviewed.  Constitutional:      Appearance: He is well-developed.  HENT:     Head: Normocephalic and atraumatic.  Eyes:     Conjunctiva/sclera: Conjunctivae normal.     Pupils: Pupils are equal, round, and reactive to light.  Cardiovascular:     Rate and Rhythm: Normal rate and regular rhythm.     Heart sounds: Normal heart sounds.  Pulmonary:     Effort: Pulmonary effort is normal.     Breath sounds: Normal breath sounds.  Abdominal:     General: Bowel sounds are normal.     Palpations: Abdomen is soft.  Musculoskeletal:     Cervical back: Normal range of motion and neck supple.  Skin:    General: Skin is warm and dry.     Capillary Refill: Capillary refill takes less than 2 seconds.  Neurological:     Mental Status: He is alert and oriented to person, place, and time.  Psychiatric:        Behavior: Behavior normal.      Musculoskeletal Exam: C-spine has limited range of motion without rotation.  Painful and limited abduction of both shoulders to about 100 degrees.  Flexion contractures of both elbows.  Limited range of motion of both wrist joints with thickening.  Complete fist formation noted.  PIP and DIP thickening.  Painful limited range of motion of both hip joints.  Painful range of motion of both knee joints with  slightly limited extension but no effusion noted.  Tenderness and thickening over both ankles.  CDAI Exam: CDAI Score: 19  Patient Global: 100 / 100; Provider Global: 70 / 100 Swollen: 0 ; Tender: 4  Joint Exam 08/18/2023      Right  Left  Knee   Tender   Tender  Ankle   Tender   Tender     Investigation: No additional findings.  Imaging: No results found.  Recent Labs: Lab Results  Component Value Date   WBC 5.6 06/17/2023   HGB 14.4 06/17/2023   PLT 212 06/17/2023   NA 137 06/17/2023   K 3.9 06/17/2023   CL 100 06/17/2023   CO2 32 06/17/2023   GLUCOSE 147 (H) 06/17/2023   BUN 11 06/17/2023   CREATININE 1.11 06/17/2023   BILITOT  0.4 06/17/2023   ALKPHOS 168 (H) 08/17/2018   AST 16 06/17/2023   ALT 26 06/17/2023   PROT 7.8 06/17/2023   ALBUMIN 2.2 (L) 08/17/2018   CALCIUM 8.7 06/17/2023   GFRAA 129 12/28/2018   QFTBGOLDPLUS NEGATIVE 07/31/2022    Speciality Comments: Enbrel stopped 08/04/22 Humira started 08/12/22  Procedures:  No procedures performed Allergies: Meperidine and Meperidine hcl   Assessment / Plan:     Visit Diagnoses: Seropositive rheumatoid arthritis (HCC) - Anti-CCP>250, RF 20, ESR 72, Bilateral knee swelling, bilateral elbow joint contractures, +synovitis: Patient presents today experiencing pain involving multiple joints.  His pain is most severe in both knees and both ankle joints.  On examination today he has painful range of motion of both knee joints but no effusion was noted.  He remains on Humira 40 mg subcutaneous injections every 14 days.  He is tolerating Humira without any side effects or injection site reactions.  He has not had any gaps in therapy recently.  After his last office visit the plan was to initiate Plaquenil as combination therapy which she took for 1 week and discontinued due to gastrointestinal cramping and diarrhea.  He does not want to restart on Plaquenil or any other immunosuppressive agents at this time.  He has found  diclofenac to be effective at helping to manage his joint pain and inflammation.  He requested a refill of diclofenac to be sent to the pharmacy today.  Discussed the risks of long-term diclofenac use.  He was encouraged to take diclofenac with food.  He will require close lab monitoring especially his renal function.  He will return for updated lab work in 1 month then every 3 months to monitor for drug toxicity.  He will remain on Humira as prescribed.  He was advised to notify us if he develops any new or worsening symptoms.  He will follow-up in the office in 3 months or sooner if needed.  High risk medication use - Humira 40 mg sq injections every 14 days.  Inadequate response to Enbrel 50 mg sq injections once weekly. Patient initiated Humira on 08/12/2022.  TB gold negative on 07/31/22.  Patient declined having TB gold drawn today and would like to have it drawn with his upcoming lab work in 1 month.  A future order for TB Gold placed today. CBC and CMP updated on 06/17/23.  His next lab work will be due in mid October and every 3 months to monitor for drug toxicity. Standing orders for CBC and CMP were placed today.   No recent or recurrent infections. Discussed the importance of holding humira if he develops signs or symptoms of an infection and to resume once the infection has completely cleared.  - Plan: CBC with Differential/Platelet, COMPLETE METABOLIC PANEL WITH GFR, QuantiFERON-TB Gold Plus  Screening for tuberculosis -Future order for TB gold placed today.  Plan: QuantiFERON-TB Gold Plus  Psoriasis: No active psoriasis.   Primary osteoarthritis of both hands: PIP and DIP thickening.  No synovitis.   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    Primary osteoarthritis of both knees: Chronic pain.  Difficulty ambulating.  Patient was referred to pain management after her last office visit but did not have a good experience.  He ran out of his prescription for diclofenac  about 1 week ago and was having increased pain and stiffness involving both knees.  He is no longer taking hydrocodone since he is not under the care of pain management.  His pain levels have been inadequately controlled.  A refill of diclofenac was sent to the pharmacy today.  He will require close lab monitoring.  Primary osteoarthritis of both feet: Chronic pain in both ankle joints.  Tenderness and thickening of both ankles.  Difficulty ambulating.  His ankle joint pain improves after taking diclofenac.  A refill of diclofenac was sent to the pharmacy today.   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   HIV disease (HCC): Followed by Dr. Luciana Axe. Currntly on Biktarvy.    History of MRSA infection  Cigarette smoker  Neuropathy  Pancytopenia (HCC): CBC with diff WNL on 06/17/23.     Orders: Orders Placed This Encounter  Procedures   CBC with Differential/Platelet   COMPLETE METABOLIC PANEL WITH GFR   QuantiFERON-TB Gold Plus   Meds ordered this encounter  Medications   diclofenac (VOLTAREN) 50 MG EC tablet    Sig: Take 1 tablet (50 mg total) by mouth 2 (two) times daily.    Dispense:  60 tablet    Refill:  2    Follow-Up Instructions: Return in about 3 months (around 11/17/2023) for Rheumatoid arthritis, Osteoarthritis.   Gearldine Bienenstock, PA-C  Note - This record has been created using Dragon software.  Chart creation errors have been sought, but may not always  have been located. Such creation errors do not reflect on  the standard of medical care.

## 2023-08-06 ENCOUNTER — Other Ambulatory Visit (HOSPITAL_COMMUNITY): Payer: Self-pay

## 2023-08-11 ENCOUNTER — Other Ambulatory Visit (HOSPITAL_COMMUNITY): Payer: Self-pay

## 2023-08-15 ENCOUNTER — Other Ambulatory Visit (HOSPITAL_COMMUNITY): Payer: Self-pay

## 2023-08-18 ENCOUNTER — Ambulatory Visit: Payer: 59 | Attending: Physician Assistant | Admitting: Physician Assistant

## 2023-08-18 ENCOUNTER — Encounter: Payer: Self-pay | Admitting: Physician Assistant

## 2023-08-18 VITALS — BP 127/79 | HR 81 | Resp 14 | Ht 73.0 in | Wt 211.0 lb

## 2023-08-18 DIAGNOSIS — F17210 Nicotine dependence, cigarettes, uncomplicated: Secondary | ICD-10-CM

## 2023-08-18 DIAGNOSIS — Z79899 Other long term (current) drug therapy: Secondary | ICD-10-CM | POA: Diagnosis not present

## 2023-08-18 DIAGNOSIS — A528 Late syphilis, latent: Secondary | ICD-10-CM

## 2023-08-18 DIAGNOSIS — M059 Rheumatoid arthritis with rheumatoid factor, unspecified: Secondary | ICD-10-CM

## 2023-08-18 DIAGNOSIS — Z8614 Personal history of Methicillin resistant Staphylococcus aureus infection: Secondary | ICD-10-CM

## 2023-08-18 DIAGNOSIS — L409 Psoriasis, unspecified: Secondary | ICD-10-CM

## 2023-08-18 DIAGNOSIS — M170 Bilateral primary osteoarthritis of knee: Secondary | ICD-10-CM

## 2023-08-18 DIAGNOSIS — M24521 Contracture, right elbow: Secondary | ICD-10-CM

## 2023-08-18 DIAGNOSIS — M24522 Contracture, left elbow: Secondary | ICD-10-CM

## 2023-08-18 DIAGNOSIS — G629 Polyneuropathy, unspecified: Secondary | ICD-10-CM

## 2023-08-18 DIAGNOSIS — M19041 Primary osteoarthritis, right hand: Secondary | ICD-10-CM | POA: Diagnosis not present

## 2023-08-18 DIAGNOSIS — B20 Human immunodeficiency virus [HIV] disease: Secondary | ICD-10-CM

## 2023-08-18 DIAGNOSIS — M19071 Primary osteoarthritis, right ankle and foot: Secondary | ICD-10-CM

## 2023-08-18 DIAGNOSIS — D61818 Other pancytopenia: Secondary | ICD-10-CM

## 2023-08-18 DIAGNOSIS — M19072 Primary osteoarthritis, left ankle and foot: Secondary | ICD-10-CM

## 2023-08-18 DIAGNOSIS — M19042 Primary osteoarthritis, left hand: Secondary | ICD-10-CM

## 2023-08-18 DIAGNOSIS — Z111 Encounter for screening for respiratory tuberculosis: Secondary | ICD-10-CM

## 2023-08-18 MED ORDER — DICLOFENAC SODIUM 50 MG PO TBEC: 50.0000 mg | DELAYED_RELEASE_TABLET | Freq: Two times a day (BID) | ORAL | 2 refills | Status: DC

## 2023-08-18 NOTE — Patient Instructions (Signed)
Standing Labs We placed an order today for your standing lab work.   Please have your standing labs drawn in October and every 3 months   Please have your labs drawn 2 weeks prior to your appointment so that the provider can discuss your lab results at your appointment, if possible.  Please note that you may see your imaging and lab results in MyChart before we have reviewed them. We will contact you once all results are reviewed. Please allow our office up to 72 hours to thoroughly review all of the results before contacting the office for clarification of your results.  WALK-IN LAB HOURS  Monday through Thursday from 8:00 am -12:30 pm and 1:00 pm-5:00 pm and Friday from 8:00 am-12:00 pm.  Patients with office visits requiring labs will be seen before walk-in labs.  You may encounter longer than normal wait times. Please allow additional time. Wait times may be shorter on  Monday and Thursday afternoons.  We do not book appointments for walk-in labs. We appreciate your patience and understanding with our staff.   Labs are drawn by [facility]. Please bring your co-pay at the time of your lab draw.  You may receive a bill from [facility] for your lab work.  Please note if you are on Hydroxychloroquine and and an order has been placed for a Hydroxychloroquine level,  you will need to have it drawn 4 hours or more after your last dose.  If you wish to have your labs drawn at another location, please call the office 24 hours in advance so we can fax the orders.  The office is located at [address], [place], [place] [address]   If you have any questions regarding directions or hours of operation,  please call [phone].   As a reminder, please drink plenty of water prior to coming for your lab work. Thanks!

## 2023-08-28 ENCOUNTER — Other Ambulatory Visit: Payer: Self-pay

## 2023-08-28 NOTE — Progress Notes (Signed)
Specialty Pharmacy Refill Coordination Note  Patrick Huber is a 52 y.o. male contacted today regarding refills of specialty medication(s) Adalimumab .  Patient requested Delivery  on 09/04/23  to verified address [address]  [name] [address]   Medication will be filled on 09/03/23.

## 2023-09-03 ENCOUNTER — Other Ambulatory Visit: Payer: Self-pay

## 2023-09-23 ENCOUNTER — Other Ambulatory Visit: Payer: Self-pay

## 2023-09-23 ENCOUNTER — Other Ambulatory Visit: Payer: Self-pay | Admitting: Physician Assistant

## 2023-09-23 ENCOUNTER — Other Ambulatory Visit (HOSPITAL_COMMUNITY): Payer: Self-pay

## 2023-09-23 DIAGNOSIS — Z79899 Other long term (current) drug therapy: Secondary | ICD-10-CM

## 2023-09-23 DIAGNOSIS — M059 Rheumatoid arthritis with rheumatoid factor, unspecified: Secondary | ICD-10-CM

## 2023-09-23 MED ORDER — HUMIRA (2 PEN) 40 MG/0.4ML ~~LOC~~ AJKT
40.0000 mg | AUTO-INJECTOR | SUBCUTANEOUS | 0 refills | Status: DC
  Filled 2023-09-23: qty 2, 28d supply, fill #0

## 2023-09-23 NOTE — Telephone Encounter (Signed)
Last Fill: 07/07/2023  Labs: 06/17/2023 Glucose is 147. Rest of CMP WNL. CBC WNL.  TB Gold: 08/01/2023   Next Visit: 11/18/2023  Last Visit: 08/18/2023  [id number] rheumatoid arthritis   Current Dose per office note 08/01/2023: Humira 40 mg sq injections every 14 days   Patient advised he is due to update labs. Patient plans to come this week to update.   Okay to refill Humira?

## 2023-09-23 NOTE — Progress Notes (Signed)
Specialty Pharmacy Refill Coordination Note  Patrick Huber is a 52 y.o. male contacted today regarding refills of specialty medication(s) Adalimumab   Patient requested Delivery   Delivery date: 10/03/23   Verified address: [address]  [place] [address]   Medication will be filled on 10/02/23 pending a refill request.

## 2023-10-02 ENCOUNTER — Other Ambulatory Visit (HOSPITAL_COMMUNITY): Payer: Self-pay

## 2023-10-22 ENCOUNTER — Other Ambulatory Visit (HOSPITAL_COMMUNITY): Payer: Self-pay

## 2023-10-22 ENCOUNTER — Other Ambulatory Visit: Payer: Self-pay

## 2023-10-22 ENCOUNTER — Other Ambulatory Visit: Payer: Self-pay | Admitting: Rheumatology

## 2023-10-22 DIAGNOSIS — M059 Rheumatoid arthritis with rheumatoid factor, unspecified: Secondary | ICD-10-CM

## 2023-10-22 DIAGNOSIS — Z79899 Other long term (current) drug therapy: Secondary | ICD-10-CM

## 2023-10-22 NOTE — Progress Notes (Signed)
Specialty Pharmacy Refill Coordination Note  Quent Lingenfelter is a 52 y.o. male contacted today regarding refills of specialty medication(s) Adalimumab   Patient requested Delivery   Delivery date: 10/29/23   Verified address: [address]  [name] [address]   Medication will be filled on 10/28/23.  Refill request pending.

## 2023-10-22 NOTE — Progress Notes (Signed)
Spoke with patient & aware. Refill Request denied; Reason: Patient should contact provider first. Comment: Patient is overdue for labs.

## 2023-11-04 NOTE — Progress Notes (Signed)
Office Visit Note  Patient: Patrick Huber             Date of Birth: 02/13/1971           MRN: [id number]             PCP: Pcp, No Referring: No ref. provider found Visit Date: 11/18/2023 Occupation: @GUAROCC @  Subjective:  Medication monitoring   History of Present Illness: Patrick Huber is a 52 y.o. male with history of rheumatoid arthritis.  Patient remains on Humira 40 mg sq injections every 14 days. He is tolerating humira without any side effects or injection site reactions.  Patient is due for his next dose of Humira on Monday.  He states that he has continued to experience breakthrough symptoms about 3 days prior to his Humira injections but overall he has found Humira to be more effective over the past few months.  He has not had any recent interruptions in therapy.  He states that his arthralgias and joint stiffness are more severe with weather changes.  He continues to have significant discomfort involving both elbows, both knees, and his right ankle.  He has noticed some increased discomfort in his right hips at times.  Patient states that his pain level has been 8/10 on a daily basis and improves to about a 6 out of 10 with the use of diclofenac 50 mg 1 tablet twice daily.  Patient requested a refill of Humira and diclofenac to be sent to the pharmacy today. He denies any new medical conditions.  He denies any recent or recurrent infections.   Activities of Daily Living:  Patient reports morning stiffness for 1-2 hours.   Patient Reports nocturnal pain.  Difficulty dressing/grooming: Reports Difficulty climbing stairs: Reports Difficulty getting out of chair: Reports Difficulty using hands for taps, buttons, cutlery, and/or writing: Reports  Review of Systems  Constitutional:  Negative for fatigue.  HENT:  Negative for mouth sores and mouth dryness.   Eyes:  Negative for pain and dryness.  Respiratory:  Negative for cough, shortness of breath and wheezing.    Cardiovascular:  Negative for chest pain and palpitations.  Gastrointestinal:  Negative for blood in stool, constipation and diarrhea.  Endocrine: Negative for increased urination.  Genitourinary:  Negative for involuntary urination.  Musculoskeletal:  Positive for joint pain, gait problem, joint pain, joint swelling, myalgias, muscle weakness, morning stiffness, muscle tenderness and myalgias.  Skin:  Negative for color change, rash and sensitivity to sunlight.  Allergic/Immunologic: Negative for susceptible to infections.  Neurological:  Negative for dizziness and headaches.  Hematological:  Negative for swollen glands.  Psychiatric/Behavioral:  Positive for sleep disturbance. Negative for depressed mood. The patient is not nervous/anxious.     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   Malnutrition of moderate degree 08/18/2018   HIV disease (HCC) 08/18/2018   Unintentional weight loss 08/18/2018   Rheumatoid arthritis (HCC) 08/18/2018   Knee mass, right 08/18/2018   Pancytopenia (HCC) 08/18/2018  Cigarette smoker 08/18/2018   GERD (gastroesophageal reflux disease) 08/18/2018   Psoriasis 08/18/2018   History of MRSA infection 08/18/2018   Neoplasm 06/21/2018   Mass 04/08/2018    Past Medical History:  Diagnosis Date   Angina pectoris (HCC)    HIV (human immunodeficiency virus infection) (HCC)    Seropositive rheumatoid  arthritis (HCC)     Family History  Problem Relation Age of Onset   Heart disease Mother    Cancer Mother    Cancer Father    Heart disease Father    Cancer Sister    Heart disease Brother    Healthy Son    Healthy Daughter    Past Surgical History:  Procedure Laterality Date   KNEE ARTHROSCOPY Left    Right knee surgery     [facility] - at [facility]   Social History   Social History Narrative   Not on file   Immunization History  Administered Date(s) Administered   Influenza,inj,Quad PF,6+ Mos 09/02/2019     Objective: Vital Signs: BP 136/84 (BP Location: Left Arm, Patient Position: Sitting, Cuff Size: Normal)   Pulse 80   Resp 17   Ht 6\' 1"  (1.854 m)   Wt 210 lb 3.2 oz (95.3 kg)   BMI 27.73 kg/m    Physical Exam Vitals and nursing note reviewed.  Constitutional:      Appearance: He is well-developed.  HENT:     Head: Normocephalic and atraumatic.  Eyes:     Conjunctiva/sclera: Conjunctivae normal.     Pupils: Pupils are equal, round, and reactive to light.  Cardiovascular:     Rate and Rhythm: Normal rate and regular rhythm.     Heart sounds: Normal heart sounds.  Pulmonary:     Effort: Pulmonary effort is normal.     Breath sounds: Normal breath sounds.  Abdominal:     General: Bowel sounds are normal.     Palpations: Abdomen is soft.  Musculoskeletal:     Cervical back: Normal range of motion and neck supple.  Skin:    General: Skin is warm and dry.     Capillary Refill: Capillary refill takes less than 2 seconds.  Neurological:     Mental Status: He is alert and oriented to person, place, and time.  Psychiatric:        Behavior: Behavior normal.      Musculoskeletal Exam: C-spine has limited range of motion with lateral Tatian.  Painful range of motion of both shoulders to about 90 degrees of abduction.  Flexion contractures noted in both elbows.  Tenderness and warmth of the left elbow.  Wrist joints have good range of motion with no tenderness or  synovitis.  No tenderness or synovitis over MCP joints.  Complete fist formation bilaterally.  Discomfort with range of motion of the right hip.  Painful range of motion of both knees with crepitus.  Warmth of the left knee noted.  CDAI Exam: CDAI Score: 16  Patient Global: 60 / 100; Provider Global: 60 / 100 Swollen: 1 ; Tender: 4  Joint Exam 11/18/2023      Right  Left  Elbow     Swollen Tender  Knee   Tender   Tender  Ankle   Tender        Investigation: No additional findings.  Imaging: No results found.  Recent Labs: Lab Results  Component Value Date   WBC 5.6 06/17/2023   HGB 14.4 06/17/2023   PLT 212 06/17/2023   NA  137 06/17/2023   K 3.9 06/17/2023   CL 100 06/17/2023   CO2 32 06/17/2023   GLUCOSE 147 (H) 06/17/2023   BUN 11 06/17/2023   CREATININE 1.11 06/17/2023   BILITOT 0.4 06/17/2023   ALKPHOS 168 (H) 08/17/2018   AST 16 06/17/2023   ALT 26 06/17/2023   PROT 7.8 06/17/2023   ALBUMIN 2.2 (L) 08/17/2018   CALCIUM 8.7 06/17/2023   GFRAA 129 12/28/2018   QFTBGOLDPLUS NEGATIVE 07/31/2022    Speciality Comments: Enbrel stopped 08/04/22 Humira started 08/12/22  Procedures:  No procedures performed Allergies: Meperidine and Meperidine hcl   Assessment / Plan:     Visit Diagnoses: Seropositive rheumatoid arthritis (HCC) - Anti-CCP>250, RF 20, ESR 72, Bilateral knee swelling, bilateral elbow joint contractures, +synovitis: Patient presents today with ongoing pain involving multiple joints.  His pain has been most severe on the left elbow, both knees, and the right ankle.  He symptoms are exacerbated by strenuous activities as well as weather changes.  He remains on Humira 40 mg subcutaneous injections every 14 days and is taking diclofenac 50 mg 1 tablet twice daily for pain relief.  Patient states that he remains in constant pain but his flares have become less frequent.  He has been experiencing breakthrough symptoms about 3 days prior to his Humira injections.   Overall he has found that Humira has been more effective over the past few months and does not want to make any medication changes.  Discussed that if he continues to have breakthrough symptoms we can discuss other treatment options at his follow-up visit.  He was given paperwork for a permanent handicap placard to assist him due to difficulty walking and standing for prolonged periods of time.   He will remain on Humira as prescribed for now.  Refills of both Humira and diclofenac for sent to the pharmacy today. He will follow-up in the office in 3 months or sooner if needed.  - Plan: adalimumab (HUMIRA, 2 PEN,) 40 MG/0.4ML pen  High risk medication use - Humira 40 mg sq injections every 14 days.  Inadequate response to Enbrel 50 mg sq injections once weekly. Patient initiated Humira on 08/12/2022. CBC and CMP updated on 06/17/23. Orders for CBC and CMP released today.  TB gold negative on 07/31/22. Order for TB gold released today. No recent or recurrent infections.  Discussed the importance of holding humira if he develops signs or symptoms of an infection and to resume once the infection has completley cleared.   - Plan: CBC with Differential/Platelet, COMPLETE METABOLIC PANEL WITH GFR, QuantiFERON-TB Gold Plus, adalimumab (HUMIRA, 2 PEN,) 40 MG/0.4ML pen  Screening for tuberculosis -Order for TB gold released today.  Plan: QuantiFERON-TB Gold Plus  Psoriasis: No active psoriasis at this time.  He will remain on Humira as prescribed.  Primary osteoarthritis of both hands: PIP and DIP thickening consistent with osteoarthritis of both hands.  No synovitis noted today.  Complete fist formation noted.  Contracture of joint of both elbows: Flexion contractures of both elbows noted.  Tenderness and warmth noted along the left elbow joint line.  Primary osteoarthritis of both knees: Chronic pain.  Painful range of motion of both knees with crepitus.  Warmth of the left knee noted.  No knee joint  effusion noted today.  He will remain on Humira as prescribed.  He was advised to notify us if he starts to have more frequent and severe flares.  Primary osteoarthritis of both feet: Painful ROM of the right  ankle. Discomfort with walking.    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  HIV disease (HCC) - Followed by Dr. Luciana Axe. Currntly on Biktarvy.  History of MRSA infection  Cigarette smoker  Neuropathy  Pancytopenia (HCC)   Orders: Orders Placed This Encounter  Procedures   CBC with Differential/Platelet   COMPLETE METABOLIC PANEL WITH GFR   QuantiFERON-TB Gold Plus   Meds ordered this encounter  Medications   diclofenac (VOLTAREN) 50 MG EC tablet    Sig: Take 1 tablet (50 mg total) by mouth 2 (two) times daily.    Dispense:  60 tablet    Refill:  2   adalimumab (HUMIRA, 2 PEN,) 40 MG/0.4ML pen    Sig: Inject 40 mg into the skin every 14 (fourteen) days. 1 kit - 2 pens    Dispense:  2 each    Refill:  2    Please send out to patient ASAP    Follow-Up Instructions: Return in about 3 months (around 02/16/2024) for Rheumatoid arthritis.   Gearldine Bienenstock, PA-C  Note - This record has been created using Dragon software.  Chart creation errors have been sought, but may not always  have been located. Such creation errors do not reflect on  the standard of medical care.

## 2023-11-07 ENCOUNTER — Other Ambulatory Visit: Payer: Self-pay

## 2023-11-18 ENCOUNTER — Encounter: Payer: Self-pay | Admitting: Physician Assistant

## 2023-11-18 ENCOUNTER — Ambulatory Visit: Payer: 59 | Attending: Physician Assistant | Admitting: Physician Assistant

## 2023-11-18 ENCOUNTER — Other Ambulatory Visit (HOSPITAL_COMMUNITY): Payer: Self-pay

## 2023-11-18 ENCOUNTER — Other Ambulatory Visit: Payer: Self-pay

## 2023-11-18 VITALS — BP 136/84 | HR 80 | Resp 17 | Ht 73.0 in | Wt 210.2 lb

## 2023-11-18 DIAGNOSIS — G629 Polyneuropathy, unspecified: Secondary | ICD-10-CM

## 2023-11-18 DIAGNOSIS — Z79899 Other long term (current) drug therapy: Secondary | ICD-10-CM | POA: Diagnosis not present

## 2023-11-18 DIAGNOSIS — M24521 Contracture, right elbow: Secondary | ICD-10-CM

## 2023-11-18 DIAGNOSIS — L409 Psoriasis, unspecified: Secondary | ICD-10-CM | POA: Diagnosis not present

## 2023-11-18 DIAGNOSIS — M19041 Primary osteoarthritis, right hand: Secondary | ICD-10-CM

## 2023-11-18 DIAGNOSIS — M19042 Primary osteoarthritis, left hand: Secondary | ICD-10-CM

## 2023-11-18 DIAGNOSIS — F17210 Nicotine dependence, cigarettes, uncomplicated: Secondary | ICD-10-CM

## 2023-11-18 DIAGNOSIS — D61818 Other pancytopenia: Secondary | ICD-10-CM

## 2023-11-18 DIAGNOSIS — Z111 Encounter for screening for respiratory tuberculosis: Secondary | ICD-10-CM

## 2023-11-18 DIAGNOSIS — M19072 Primary osteoarthritis, left ankle and foot: Secondary | ICD-10-CM

## 2023-11-18 DIAGNOSIS — M19071 Primary osteoarthritis, right ankle and foot: Secondary | ICD-10-CM

## 2023-11-18 DIAGNOSIS — M059 Rheumatoid arthritis with rheumatoid factor, unspecified: Secondary | ICD-10-CM | POA: Diagnosis not present

## 2023-11-18 DIAGNOSIS — Z8614 Personal history of Methicillin resistant Staphylococcus aureus infection: Secondary | ICD-10-CM

## 2023-11-18 DIAGNOSIS — M170 Bilateral primary osteoarthritis of knee: Secondary | ICD-10-CM

## 2023-11-18 DIAGNOSIS — M24522 Contracture, left elbow: Secondary | ICD-10-CM

## 2023-11-18 DIAGNOSIS — B20 Human immunodeficiency virus [HIV] disease: Secondary | ICD-10-CM

## 2023-11-18 DIAGNOSIS — A528 Late syphilis, latent: Secondary | ICD-10-CM

## 2023-11-18 MED ORDER — HUMIRA (2 PEN) 40 MG/0.4ML ~~LOC~~ AJKT
40.0000 mg | AUTO-INJECTOR | SUBCUTANEOUS | 2 refills | Status: DC
  Filled 2023-11-18: qty 2, 28d supply, fill #0
  Filled 2023-12-05: qty 2, 28d supply, fill #1
  Filled 2024-01-07: qty 2, 28d supply, fill #2

## 2023-11-18 MED ORDER — DICLOFENAC SODIUM 50 MG PO TBEC
50.0000 mg | DELAYED_RELEASE_TABLET | Freq: Two times a day (BID) | ORAL | 2 refills | Status: DC
  Filled 2023-11-18: qty 60, 30d supply, fill #0
  Filled 2024-01-07: qty 60, 30d supply, fill #1
  Filled 2024-02-11: qty 60, 30d supply, fill #2

## 2023-11-18 NOTE — Progress Notes (Signed)
Spoke with patient to let him know we got a rx for Humira & will ship 12/17 or the latest 12/18

## 2023-11-19 NOTE — Progress Notes (Signed)
Glucose is 138.  Globulin is borderline elevated. Rest of CMP stable. CBC WNL.

## 2023-11-20 LAB — COMPLETE METABOLIC PANEL WITH GFR
AG Ratio: 1.1 (calc) (ref 1.0–2.5)
ALT: 20 U/L (ref 9–46)
AST: 13 U/L (ref 10–35)
Albumin: 4.3 g/dL (ref 3.6–5.1)
Alkaline phosphatase (APISO): 116 U/L (ref 35–144)
BUN: 9 mg/dL (ref 7–25)
CO2: 33 mmol/L — ABNORMAL HIGH (ref 20–32)
Calcium: 9 mg/dL (ref 8.6–10.3)
Chloride: 99 mmol/L (ref 98–110)
Creat: 0.93 mg/dL (ref 0.70–1.30)
Globulin: 3.8 g/dL — ABNORMAL HIGH (ref 1.9–3.7)
Glucose, Bld: 138 mg/dL — ABNORMAL HIGH (ref 65–99)
Potassium: 3.8 mmol/L (ref 3.5–5.3)
Sodium: 137 mmol/L (ref 135–146)
Total Bilirubin: 0.4 mg/dL (ref 0.2–1.2)
Total Protein: 8.1 g/dL (ref 6.1–8.1)
eGFR: 99 mL/min/{1.73_m2} (ref >=60)

## 2023-11-20 LAB — CBC WITH DIFFERENTIAL/PLATELET
Absolute Lymphocytes: 3175 {cells}/uL (ref 850–3900)
Absolute Monocytes: 321 {cells}/uL (ref 200–950)
Basophils Absolute: 19 {cells}/uL (ref 0–200)
Basophils Relative: 0.3 %
Eosinophils Absolute: 69 {cells}/uL (ref 15–500)
Eosinophils Relative: 1.1 %
HCT: 44.7 % (ref 38.5–50.0)
Hemoglobin: 14.8 g/dL (ref 13.2–17.1)
MCH: 30.4 pg (ref 27.0–33.0)
MCHC: 33.1 g/dL (ref 32.0–36.0)
MCV: 91.8 fL (ref 80.0–100.0)
MPV: 9.4 fL (ref 7.5–12.5)
Monocytes Relative: 5.1 %
Neutro Abs: 2715 {cells}/uL (ref 1500–7800)
Neutrophils Relative %: 43.1 %
Platelets: 201 10*3/uL (ref 140–400)
RBC: 4.87 10*6/uL (ref 4.20–5.80)
RDW: 13.6 % (ref 11.0–15.0)
Total Lymphocyte: 50.4 %
WBC: 6.3 10*3/uL (ref 3.8–10.8)

## 2023-11-20 LAB — QUANTIFERON-TB GOLD PLUS
Mitogen-NIL: 8.35 [IU]/mL
NIL: 0.04 [IU]/mL
QuantiFERON-TB Gold Plus: NEGATIVE
TB1-NIL: 0.01 [IU]/mL
TB2-NIL: 0 [IU]/mL

## 2023-11-20 NOTE — Progress Notes (Signed)
TB gold negative

## 2023-12-05 ENCOUNTER — Other Ambulatory Visit: Payer: Self-pay

## 2023-12-05 NOTE — Progress Notes (Signed)
 Specialty Pharmacy Refill Coordination Note  Patrick Huber is a 53 y.o. male contacted today regarding refills of specialty medication(s) Adalimumab  (Humira  (2 Pen))   Patient requested Delivery   Delivery date: 12/17/23   Verified address: [address]  [place] [address]   Medication will be filled on 12/16/23.

## 2023-12-08 ENCOUNTER — Other Ambulatory Visit: Payer: Self-pay

## 2023-12-16 ENCOUNTER — Other Ambulatory Visit: Payer: Self-pay

## 2024-01-07 ENCOUNTER — Other Ambulatory Visit: Payer: Self-pay

## 2024-01-07 NOTE — Progress Notes (Signed)
 Specialty Pharmacy Ongoing Clinical Assessment Note  Patrick Huber is a 53 y.o. male who is being followed by the specialty pharmacy service for RxSp Rheumatoid Arthritis   Patient's specialty medication(s) reviewed today: Adalimumab  (Humira  (2 Pen))   Missed doses in the last 4 weeks: 0   Patient/Caregiver did not have any additional questions or concerns.   Therapeutic benefit summary: Patient is achieving benefit   Adverse events/side effects summary: No adverse events/side effects   Patient's therapy is appropriate to: Continue    Goals Addressed             This Visit's Progress    Minimize recurrence of flares       Patient is on track. Patient will maintain adherence and adhere to provider and/or lab appointments         Follow up:  6 months  Wendee Hata E Jamon [place] Specialty Pharmacist

## 2024-01-07 NOTE — Progress Notes (Addendum)
 Specialty Pharmacy Refill Coordination Note  Patrick Huber is a 53 y.o. male contacted today regarding refills of specialty medication(s) Adalimumab  (Humira  (2 Pen))   Patient requested Delivery   Delivery date: 01/13/24   Verified address: [address] [place], [place] [address]   Medication will be filled on 01/12/2024. Next injection due 01/19/2024

## 2024-01-08 ENCOUNTER — Other Ambulatory Visit: Payer: Self-pay

## 2024-01-12 ENCOUNTER — Other Ambulatory Visit: Payer: Self-pay

## 2024-01-27 ENCOUNTER — Other Ambulatory Visit: Payer: Self-pay

## 2024-02-02 NOTE — Progress Notes (Deleted)
 Office Visit Note  Patient: Patrick Huber             Date of Birth: 18-Nov-1971           MRN: [id number]             PCP: Pcp, No Referring: No ref. provider found Visit Date: 02/16/2024 Occupation: @GUAROCC @  Subjective:    History of Present Illness: Jaydeen Odor is a 53 y.o. male with history of seropositive rheumatoid arthritis and osteoarthritis.  Patient remains on  Humira 40 mg sq injections every 14 days.   CBC and CMP updated on 11/18/23. Orders for CBC and CMP released today.  TB gold negative on 11/18/23.  Discussed the importance of holding humira if he develops signs or symptoms of an infection and to resume once the infection has completely cleared.   Activities of Daily Living:  Patient reports morning stiffness for *** {minute/hour:19697}.   Patient {ACTIONS;DENIES/REPORTS:21021675::"Denies"} nocturnal pain.  Difficulty dressing/grooming: {ACTIONS;DENIES/REPORTS:21021675::"Denies"} Difficulty climbing stairs: {ACTIONS;DENIES/REPORTS:21021675::"Denies"} Difficulty getting out of chair: {ACTIONS;DENIES/REPORTS:21021675::"Denies"} Difficulty using hands for taps, buttons, cutlery, and/or writing: {ACTIONS;DENIES/REPORTS:21021675::"Denies"}  No Rheumatology ROS completed.   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   Malnutrition of moderate degree 08/18/2018   HIV disease (HCC) 08/18/2018   Unintentional  weight loss 08/18/2018   Rheumatoid arthritis (HCC) 08/18/2018   Knee mass, right 08/18/2018   Pancytopenia (HCC) 08/18/2018   Cigarette smoker 08/18/2018   GERD (gastroesophageal reflux disease) 08/18/2018   Psoriasis 08/18/2018   History of MRSA infection 08/18/2018   Neoplasm 06/21/2018   Mass 04/08/2018    Past Medical History:  Diagnosis Date   Angina pectoris (HCC)    HIV (human immunodeficiency virus infection) (HCC)    Seropositive rheumatoid arthritis (HCC)     Family History  Problem Relation Age of Onset   Heart disease Mother    Cancer Mother    Cancer Father    Heart disease Father    Cancer Sister    Heart disease Brother    Healthy Son    Healthy Daughter    Past Surgical History:  Procedure Laterality Date   KNEE ARTHROSCOPY Left    Right knee surgery     [facility] - at [facility]   Social History   Social History Narrative   Not on file   Immunization History  Administered Date(s) Administered   Influenza,inj,Quad PF,6+ Mos 09/02/2019     Objective: Vital Signs: There were no vitals taken for this visit.   Physical Exam Vitals and nursing note reviewed.  Constitutional:      Appearance: He is well-developed.  HENT:     Head: Normocephalic and atraumatic.  Eyes:     Conjunctiva/sclera: Conjunctivae normal.     Pupils: Pupils are equal, round, and reactive to light.  Cardiovascular:     Rate and Rhythm: Normal rate and regular rhythm.     Heart sounds: Normal heart sounds.  Pulmonary:     Effort: Pulmonary effort is normal.  Breath sounds: Normal breath sounds.  Abdominal:     General: Bowel sounds are normal.     Palpations: Abdomen is soft.  Musculoskeletal:     Cervical back: Normal range of motion and neck supple.  Skin:    General: Skin is warm and dry.     Capillary Refill: Capillary refill takes less than 2 seconds.  Neurological:     Mental Status: He is alert and oriented to person, place, and time.  Psychiatric:        Behavior:  Behavior normal.      Musculoskeletal Exam: ***  CDAI Exam: CDAI Score: -- Patient Global: --; Provider Global: -- Swollen: --; Tender: -- Joint Exam 02/16/2024   No joint exam has been documented for this visit   There is currently no information documented on the homunculus. Go to the Rheumatology activity and complete the homunculus joint exam.  Investigation: No additional findings.  Imaging: No results found.  Recent Labs: Lab Results  Component Value Date   WBC 6.3 11/18/2023   HGB 14.8 11/18/2023   PLT 201 11/18/2023   NA 137 11/18/2023   K 3.8 11/18/2023   CL 99 11/18/2023   CO2 33 (H) 11/18/2023   GLUCOSE 138 (H) 11/18/2023   BUN 9 11/18/2023   CREATININE 0.93 11/18/2023   BILITOT 0.4 11/18/2023   ALKPHOS 168 (H) 08/17/2018   AST 13 11/18/2023   ALT 20 11/18/2023   PROT 8.1 11/18/2023   ALBUMIN 2.2 (L) 08/17/2018   CALCIUM 9.0 11/18/2023   GFRAA 129 12/28/2018   QFTBGOLDPLUS NEGATIVE 11/18/2023    Speciality Comments: Enbrel stopped 08/04/22 Humira started 08/12/22  Procedures:  No procedures performed Allergies: Meperidine and Meperidine hcl   Assessment / Plan:     Visit Diagnoses: Seropositive rheumatoid arthritis (HCC)  High risk medication use  Psoriasis  Primary osteoarthritis of both hands  Contracture of joint of both elbows  Primary osteoarthritis of both knees  Primary osteoarthritis of both feet  Late latent syphilis  History of MRSA infection  HIV disease (HCC)  Cigarette smoker  Neuropathy  Pancytopenia (HCC)  Orders: No orders of the defined types were placed in this encounter.  No orders of the defined types were placed in this encounter.   Face-to-face time spent with patient was *** minutes. Greater than 50% of time was spent in counseling and coordination of care.  Follow-Up Instructions: No follow-ups on file.   Gearldine Bienenstock, PA-C  Note - This record has been created using Dragon software.  Chart  creation errors have been sought, but may not always  have been located. Such creation errors do not reflect on  the standard of medical care.

## 2024-02-05 ENCOUNTER — Other Ambulatory Visit: Payer: Self-pay

## 2024-02-09 ENCOUNTER — Other Ambulatory Visit: Payer: Self-pay

## 2024-02-09 ENCOUNTER — Other Ambulatory Visit (HOSPITAL_COMMUNITY): Payer: Self-pay

## 2024-02-09 ENCOUNTER — Other Ambulatory Visit: Payer: Self-pay | Admitting: Physician Assistant

## 2024-02-09 DIAGNOSIS — Z79899 Other long term (current) drug therapy: Secondary | ICD-10-CM

## 2024-02-09 DIAGNOSIS — M059 Rheumatoid arthritis with rheumatoid factor, unspecified: Secondary | ICD-10-CM

## 2024-02-09 MED ORDER — HUMIRA (2 PEN) 40 MG/0.4ML ~~LOC~~ AJKT
40.0000 mg | AUTO-INJECTOR | SUBCUTANEOUS | 2 refills | Status: DC
  Filled 2024-02-09: qty 2, 28d supply, fill #0
  Filled 2024-03-05: qty 2, 28d supply, fill #1
  Filled 2024-04-12: qty 2, 28d supply, fill #2

## 2024-02-09 NOTE — Progress Notes (Signed)
 Specialty Pharmacy Refill Coordination Note  Patrick Huber is a 53 y.o. male contacted today regarding refills of specialty medication(s) Adalimumab (Humira (2 Pen))   Patient requested Delivery   Delivery date: 02/12/24   Verified address: [address]   [name] [address]   Medication will be filled on 02/11/24.

## 2024-02-09 NOTE — Telephone Encounter (Signed)
 Last Fill: 11/18/2023  Labs: 11/18/2023 Glucose is 138.  Globulin is borderline elevated. Rest of CMP stable. CBC WNL.   TB Gold: 11/18/2023  TB gold negative    Next Visit: 02/16/2024  Last Visit: 11/18/2023  ON:[id number] rheumatoid arthritis   Current Dose per office note 11/18/2023: Humira 40 mg sq injections every 14 days.   Okay to refill Humira?

## 2024-02-16 ENCOUNTER — Ambulatory Visit: Payer: 59 | Admitting: Physician Assistant

## 2024-02-16 DIAGNOSIS — D61818 Other pancytopenia: Secondary | ICD-10-CM

## 2024-02-16 DIAGNOSIS — G629 Polyneuropathy, unspecified: Secondary | ICD-10-CM

## 2024-02-16 DIAGNOSIS — M19041 Primary osteoarthritis, right hand: Secondary | ICD-10-CM

## 2024-02-16 DIAGNOSIS — A528 Late syphilis, latent: Secondary | ICD-10-CM

## 2024-02-16 DIAGNOSIS — L409 Psoriasis, unspecified: Secondary | ICD-10-CM

## 2024-02-16 DIAGNOSIS — M059 Rheumatoid arthritis with rheumatoid factor, unspecified: Secondary | ICD-10-CM

## 2024-02-16 DIAGNOSIS — M24521 Contracture, right elbow: Secondary | ICD-10-CM

## 2024-02-16 DIAGNOSIS — M170 Bilateral primary osteoarthritis of knee: Secondary | ICD-10-CM

## 2024-02-16 DIAGNOSIS — Z79899 Other long term (current) drug therapy: Secondary | ICD-10-CM

## 2024-02-16 DIAGNOSIS — B20 Human immunodeficiency virus [HIV] disease: Secondary | ICD-10-CM

## 2024-02-16 DIAGNOSIS — M19071 Primary osteoarthritis, right ankle and foot: Secondary | ICD-10-CM

## 2024-02-16 DIAGNOSIS — F17210 Nicotine dependence, cigarettes, uncomplicated: Secondary | ICD-10-CM

## 2024-02-16 DIAGNOSIS — Z8614 Personal history of Methicillin resistant Staphylococcus aureus infection: Secondary | ICD-10-CM

## 2024-02-19 NOTE — Progress Notes (Deleted)
 Office Visit Note  Patient: Patrick Huber             Date of Birth: Oct 31, 1971           MRN: [id number]             PCP: Pcp, No Referring: No ref. provider found Visit Date: 02/20/2024 Occupation: @GUAROCC @  Subjective:    History of Present Illness: Patrick Huber is a 53 y.o. male with history of seropositive rheumatoid arthritis and osteoarthritis.  Patient remains on  Humira 40 mg sq injections every 14 days.   CBC and CMP updated on 11/18/23. Orders for CBC and CMP released today.  TB gold negative on 11/18/23.  Discussed the importance of holding humira if he develops signs or symptoms of an infection and to resume once the infection has completely cleared.   Activities of Daily Living:  Patient reports morning stiffness for *** {minute/hour:19697}.   Patient {ACTIONS;DENIES/REPORTS:21021675::"Denies"} nocturnal pain.  Difficulty dressing/grooming: {ACTIONS;DENIES/REPORTS:21021675::"Denies"} Difficulty climbing stairs: {ACTIONS;DENIES/REPORTS:21021675::"Denies"} Difficulty getting out of chair: {ACTIONS;DENIES/REPORTS:21021675::"Denies"} Difficulty using hands for taps, buttons, cutlery, and/or writing: {ACTIONS;DENIES/REPORTS:21021675::"Denies"}  No Rheumatology ROS completed.   PMFS History:  Patient Active Problem List   Diagnosis Date Noted  . Encounter for long-term (current) use of high-risk medication 06/10/2023  . Disorder involving the immune mechanism, unspecified (HCC) 06/20/2022  . Essential hypertension 06/20/2022  . Hyperlipidemia 06/20/2022  . Hypothyroidism 06/20/2022  . Overweight (BMI 25.0-29.9) 05/28/2022  . Chronic, continuous use of opioids 02/26/2022  . Pain medication agreement 02/05/2022  . Anemia 11/09/2018  . Elevated liver enzymes 11/09/2018  . Neuropathy 10/07/2018  . Bilateral lower extremity edema 09/08/2018  . 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 Malnutrition of moderate degree 08/18/2018  . HIV disease (HCC) 08/18/2018  .  Unintentional weight loss 08/18/2018  . Rheumatoid arthritis (HCC) 08/18/2018  . Knee mass, right 08/18/2018  . Pancytopenia (HCC) 08/18/2018  . Cigarette smoker 08/18/2018  . GERD (gastroesophageal reflux disease) 08/18/2018  . Psoriasis 08/18/2018  . History of MRSA infection 08/18/2018  . Neoplasm 06/21/2018  . Mass 04/08/2018    Past Medical History:  Diagnosis Date  . Angina pectoris (HCC)   . HIV (human immunodeficiency virus infection) (HCC)   . Seropositive rheumatoid arthritis (HCC)     Family History  Problem Relation Age of Onset  . Heart disease Mother   . Cancer Mother   . Cancer Father   . Heart disease Father   . Cancer Sister   . Heart disease Brother   . Healthy Son   . Healthy Daughter    Past Surgical History:  Procedure Laterality Date  . KNEE ARTHROSCOPY Left   . Right knee surgery     [facility] - at [facility]   Social History   Social History Narrative  . Not on file   Immunization History  Administered Date(s) Administered  . Influenza,inj,Quad PF,6+ Mos 09/02/2019     Objective: Vital Signs: There were no vitals taken for this visit.   Physical Exam Vitals and nursing note reviewed.  Constitutional:      Appearance: He is well-developed.  HENT:     Head: Normocephalic and atraumatic.  Eyes:     Conjunctiva/sclera: Conjunctivae normal.     Pupils: Pupils are equal, round, and reactive to light.  Cardiovascular:     Rate and Rhythm: Normal rate and regular rhythm.     Heart sounds: Normal heart sounds.  Pulmonary:     Effort: Pulmonary effort is normal.  Breath sounds: Normal breath sounds.  Abdominal:     General: Bowel sounds are normal.     Palpations: Abdomen is soft.  Musculoskeletal:     Cervical back: Normal range of motion and neck supple.  Skin:    General: Skin is warm and dry.     Capillary Refill: Capillary refill takes less than 2 seconds.  Neurological:     Mental Status: He is alert and oriented to person, place, and  time.  Psychiatric:        Behavior: Behavior normal.     Musculoskeletal Exam: ***  CDAI Exam: CDAI Score: -- Patient Global: --; Provider Global: -- Swollen: --; Tender: -- Joint Exam 02/20/2024   No joint exam has been documented for this visit   There is currently no information documented on the homunculus. Go to the Rheumatology activity and complete the homunculus joint exam.  Investigation: No additional findings.  Imaging: No results found.  Recent Labs: Lab Results  Component Value Date   WBC 6.3 11/18/2023   HGB 14.8 11/18/2023   PLT 201 11/18/2023   NA 137 11/18/2023   K 3.8 11/18/2023   CL 99 11/18/2023   CO2 33 (H) 11/18/2023   GLUCOSE 138 (H) 11/18/2023   BUN 9 11/18/2023   CREATININE 0.93 11/18/2023   BILITOT 0.4 11/18/2023   ALKPHOS 168 (H) 08/17/2018   AST 13 11/18/2023   ALT 20 11/18/2023   PROT 8.1 11/18/2023   ALBUMIN 2.2 (L) 08/17/2018   CALCIUM 9.0 11/18/2023   GFRAA 129 12/28/2018   QFTBGOLDPLUS NEGATIVE 11/18/2023    Speciality Comments: Enbrel stopped 08/04/22 Humira started 08/12/22  Procedures:  No procedures performed Allergies: Meperidine and Meperidine hcl   Assessment / Plan:     Visit Diagnoses: Seropositive rheumatoid arthritis (HCC)  High risk medication use  Pancytopenia (HCC)  Psoriasis  Primary osteoarthritis of both hands  Contracture of joint of both elbows  Primary osteoarthritis of both knees  Primary osteoarthritis of both feet  Late latent syphilis  HIV disease (HCC)  History of MRSA infection  Cigarette smoker  Neuropathy  Orders: No orders of the defined types were placed in this encounter.  No orders of the defined types were placed in this encounter.   Face-to-face time spent with patient was *** minutes. Greater than 50% of time was spent in counseling and coordination of care.  Follow-Up Instructions: No follow-ups on file.   Gearldine Bienenstock, PA-C  Note - This record has been  created using Dragon software.  Chart creation errors have been sought, but may not always  have been located. Such creation errors do not reflect on  the standard of medical care.

## 2024-02-20 ENCOUNTER — Ambulatory Visit: Admitting: Physician Assistant

## 2024-02-20 DIAGNOSIS — F17210 Nicotine dependence, cigarettes, uncomplicated: Secondary | ICD-10-CM

## 2024-02-20 DIAGNOSIS — M24521 Contracture, right elbow: Secondary | ICD-10-CM

## 2024-02-20 DIAGNOSIS — M059 Rheumatoid arthritis with rheumatoid factor, unspecified: Secondary | ICD-10-CM

## 2024-02-20 DIAGNOSIS — L409 Psoriasis, unspecified: Secondary | ICD-10-CM

## 2024-02-20 DIAGNOSIS — Z8614 Personal history of Methicillin resistant Staphylococcus aureus infection: Secondary | ICD-10-CM

## 2024-02-20 DIAGNOSIS — M170 Bilateral primary osteoarthritis of knee: Secondary | ICD-10-CM

## 2024-02-20 DIAGNOSIS — M19071 Primary osteoarthritis, right ankle and foot: Secondary | ICD-10-CM

## 2024-02-20 DIAGNOSIS — D61818 Other pancytopenia: Secondary | ICD-10-CM

## 2024-02-20 DIAGNOSIS — G629 Polyneuropathy, unspecified: Secondary | ICD-10-CM

## 2024-02-20 DIAGNOSIS — Z79899 Other long term (current) drug therapy: Secondary | ICD-10-CM

## 2024-02-20 DIAGNOSIS — A528 Late syphilis, latent: Secondary | ICD-10-CM

## 2024-02-20 DIAGNOSIS — M19042 Primary osteoarthritis, left hand: Secondary | ICD-10-CM

## 2024-02-20 DIAGNOSIS — B20 Human immunodeficiency virus [HIV] disease: Secondary | ICD-10-CM

## 2024-03-02 ENCOUNTER — Other Ambulatory Visit: Payer: Self-pay

## 2024-03-05 ENCOUNTER — Other Ambulatory Visit: Payer: Self-pay | Admitting: Physician Assistant

## 2024-03-05 ENCOUNTER — Other Ambulatory Visit: Payer: Self-pay | Admitting: Pharmacy Technician

## 2024-03-05 ENCOUNTER — Other Ambulatory Visit: Payer: Self-pay

## 2024-03-05 NOTE — Progress Notes (Signed)
 Specialty Pharmacy Refill Coordination Note  Patrick Huber is a 53 y.o. male contacted today regarding refills of specialty medication(s) Adalimumab (Humira (2 Pen))   Patient requested Delivery   Delivery date: 03/16/24   Verified address: [address] [place]   Medication will be filled on 03/15/24.

## 2024-03-15 ENCOUNTER — Other Ambulatory Visit: Payer: Self-pay

## 2024-04-07 ENCOUNTER — Other Ambulatory Visit (HOSPITAL_COMMUNITY): Payer: Self-pay

## 2024-04-08 ENCOUNTER — Other Ambulatory Visit: Payer: Self-pay

## 2024-04-12 ENCOUNTER — Other Ambulatory Visit: Payer: Self-pay

## 2024-04-12 ENCOUNTER — Other Ambulatory Visit (HOSPITAL_COMMUNITY): Payer: Self-pay

## 2024-04-12 ENCOUNTER — Other Ambulatory Visit: Payer: Self-pay | Admitting: Physician Assistant

## 2024-04-12 MED ORDER — DICLOFENAC SODIUM 50 MG PO TBEC
50.0000 mg | DELAYED_RELEASE_TABLET | Freq: Two times a day (BID) | ORAL | 2 refills | Status: DC
  Filled 2024-04-12: qty 60, 30d supply, fill #0

## 2024-04-12 NOTE — Progress Notes (Unsigned)
 Office Visit Note  Patient: Patrick Huber             Date of Birth: Feb 10, 1971           MRN: [id number]             PCP: Pcp, No Referring: No ref. provider found Visit Date: 04/13/2024 Occupation: @GUAROCC @  Subjective:  Pain in multiple joints  History of Present Illness: Patrick Huber is a 53 y.o. male with history of seropositive rheumatoid arthritis.  Patient remains on humira  40 mg sq injections every 14 days.   He is tolerating Humira  without any side effects or injection site reactions.  Patient states that 3 to 4 days prior to his Humira  dose he experiences significant breakthrough symptoms.  He is currently experiencing severe pain, stiffness, and inflammation in his right elbow, left hip, and both knee joints.  Patient has been taking diclofenac  50 mg 1 tablet twice daily for pain relief.  Patient states that his pain levels have not been adequately controlled and he would like to discuss treatment alternatives.  Activities of Daily Living:  Patient reports morning stiffness for 1-2 hours.   Patient Reports nocturnal pain.  Difficulty dressing/grooming: Denies Difficulty climbing stairs: Reports Difficulty getting out of chair: Reports Difficulty using hands for taps, buttons, cutlery, and/or writing: Reports  Review of Systems  Constitutional:  Negative for fatigue.  HENT:  Negative for mouth sores and mouth dryness.   Eyes:  Negative for dryness.  Respiratory:  Negative for shortness of breath.   Cardiovascular:  Negative for chest pain and palpitations.  Gastrointestinal:  Negative for blood in stool, constipation and diarrhea.  Endocrine: Negative for increased urination.  Genitourinary:  Negative for involuntary urination.  Musculoskeletal:  Positive for joint pain, gait problem, joint pain, joint swelling, myalgias, morning stiffness, muscle tenderness and myalgias. Negative for muscle weakness.  Skin:  Negative for color change, rash, hair loss and  sensitivity to sunlight.  Allergic/Immunologic: Negative for susceptible to infections.  Neurological:  Negative for dizziness and headaches.  Hematological:  Negative for swollen glands.  Psychiatric/Behavioral:  Negative for depressed mood and sleep disturbance. The patient is not nervous/anxious.     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   Malnutrition of moderate degree 08/18/2018   HIV disease (HCC) 08/18/2018   Unintentional weight loss 08/18/2018   Rheumatoid arthritis (HCC) 08/18/2018   Knee mass, right 08/18/2018   Pancytopenia (HCC) 08/18/2018   Cigarette smoker 08/18/2018   GERD (gastroesophageal reflux disease) 08/18/2018   Psoriasis 08/18/2018   History of MRSA infection 08/18/2018   Neoplasm 06/21/2018   Mass 04/08/2018    Past Medical History:  Diagnosis Date   Angina pectoris (HCC)    HIV (human immunodeficiency virus infection) (HCC)    Seropositive rheumatoid arthritis (HCC)     Family History  Problem Relation Age of Onset   Heart disease Mother    Cancer Mother    Cancer Father    Heart  disease Father    Cancer Sister    Heart disease Brother    Healthy Son    Healthy Daughter    Past Surgical History:  Procedure Laterality Date   KNEE ARTHROSCOPY Left    Right knee surgery     [facility] - at [facility]   Social History   Social History Narrative   Not on file   Immunization History  Administered  Date(s) Administered   Influenza,inj,Quad PF,6+ Mos 09/02/2019     Objective: Vital Signs: BP 134/85 (BP Location: Left Arm, Patient Position: Sitting, Cuff Size: Normal)   Pulse 80   Resp 16   Ht 6\' 1"  (1.854 m)   Wt 208 lb (94.3 kg)   BMI 27.44 kg/m    Physical Exam Vitals and nursing note reviewed.  Constitutional:      Appearance: He is well-developed.  HENT:     Head: Normocephalic and atraumatic.  Eyes:     Conjunctiva/sclera: Conjunctivae normal.     Pupils: Pupils are equal, round, and reactive to light.  Cardiovascular:     Rate and Rhythm: Normal rate and regular rhythm.     Heart sounds: Normal heart sounds.  Pulmonary:     Effort: Pulmonary effort is normal.     Breath sounds: Normal breath sounds.  Abdominal:     General: Bowel sounds are normal.     Palpations: Abdomen is soft.  Musculoskeletal:     Cervical back: Normal range of motion and neck supple.  Skin:    General: Skin is warm and dry.     Capillary Refill: Capillary refill takes less than 2 seconds.  Neurological:     Mental Status: He is alert and oriented to person, place, and time.  Psychiatric:        Behavior: Behavior normal.      Musculoskeletal Exam: C-spine has limited range of motion with lateral rotation.  Painful range of motion of both shoulders.  Limited abduction to about 90 degrees.  Flexion contractures noted in both elbows with tenderness and inflammation in his right elbow currently.  Tenderness and inflammation in the left first PIP joint. Painful range of motion of both knees with crepitus bilaterally.  Warmth of both knees noted.  Tenderness of both ankle joints.  Limited range of motion and discomfort with range of motion of the left hip.  CDAI Exam: CDAI Score: -- Patient Global: --; Provider Global: -- Swollen: --; Tender: -- Joint Exam 04/13/2024   No joint exam has been documented for this visit   There is currently no information documented on the homunculus. Go  to the Rheumatology activity and complete the homunculus joint exam.  Investigation: No additional findings.  Imaging: No results found.  Recent Labs: Lab Results  Component Value Date   WBC 6.3 11/18/2023   HGB 14.8 11/18/2023   PLT 201 11/18/2023   NA 137 11/18/2023   K 3.8 11/18/2023   CL 99 11/18/2023   CO2 33 (H) 11/18/2023   GLUCOSE 138 (H) 11/18/2023   BUN 9 11/18/2023   CREATININE 0.93 11/18/2023   BILITOT 0.4 11/18/2023   ALKPHOS 168 (H) 08/17/2018   AST 13 11/18/2023   ALT 20 11/18/2023   PROT 8.1 11/18/2023   ALBUMIN 2.2 (L) 08/17/2018   CALCIUM 9.0 11/18/2023   GFRAA 129 12/28/2018   QFTBGOLDPLUS NEGATIVE 11/18/2023    Speciality Comments: Enbrel  stopped 08/04/22 Humira  started 08/12/22  Procedures:  No procedures performed Allergies: Meperidine and Meperidine hcl  Assessment / Plan:     Visit Diagnoses: Seropositive rheumatoid arthritis (HCC) - Anti-CCP>250, RF 20, ESR 72, Bilateral knee swelling, bilateral elbow joint contractures, +synovitis: Patient presents today experiencing severe pain and active inflammation involving multiple joints.  He has been taking diclofenac  50 mg 1 tablet twice daily for pain relief and remains on Humira  40 mg subcu days injections once every 14 days.  He has been experiencing significant breakthrough symptoms 3 to 4 days prior to being due for his dose of Humira  and does not feel that his symptoms are adequately controlled.  He has active inflammation involving the right elbow, left first PIP joint, both knees, and left hip.  Different treatment options were discussed today in detail.  He is apprehensive to make any medication changes but is open to switching to Simponi Aria infusions if approved by insurance.  Indications, contraindications, potential side effects of Simponi Aria were discussed today in detail.  All questions were addressed and consent was updated.  Patient plans on taking his final dose of Humira  today and will be  awaiting approval to initiate Simponi Aria and will be eligible to initiate therapy in 2 weeks.  He will follow-up in 8 weeks to assess his response to the loading dose.  - Plan: Lipid panel  Medication counseling:   Baseline Immunosuppressant Therapy Labs TB GOLD    Latest Ref Rng & Units 11/18/2023    1:58 PM  Quantiferon TB Gold  Quantiferon TB Gold Plus NEGATIVE NEGATIVE    Hepatitis Panel    Latest Ref Rng & Units 07/30/2021    8:56 AM  Hepatitis  Hep B Surface Ag NON-REACTIVE NON-REACTIVE   Hep B IgM NON-REACTIVE NON-REACTIVE   Hep C Ab NON-REACTIVE NON-REACTIVE    HIV Lab Results  Component Value Date   HIV REPEATEDLY REACTIVE (A) 12/28/2018   HIV Reactive (A) 08/17/2018   Immunoglobulins    Latest Ref Rng & Units 07/30/2021    8:56 AM  Immunoglobulin Electrophoresis  IgA  47 - 310 mg/dL 161   IgG 096 - 0,454 mg/dL 0,981   IgM 50 - 191 mg/dL 478    SPEP    Latest Ref Rng & Units 11/18/2023    1:58 PM  Serum Protein Electrophoresis  Total Protein 6.1 - 8.1 g/dL 8.1    G9FA Lab Results  Component Value Date   G6PDH 18.5 07/30/2021   TPMT Lab Results  Component Value Date   TPMT 18 07/30/2021     Chest x-ray: no active disease on 08/19/18   Does patient have diagnosis of heart failure?  No  Counseled patient that Simponi Dennard Fisher is a TNF blocking agent.  Reviewed Simponi Aria dose of 2 mg/kg at weeks 0, 4, and then every 8 weeks thereafter.  Counseled patient on purpose, proper use, and adverse effects of Simponi.  Reviewed the most common adverse effects including infections and infusion reactions.  Discussed that there is the possibility of an increased risk of malignancy but it is not well understood if this increased risk is due to the medication or the disease state.  Advised patient to get yearly dermatology exams due to risk of skin cancer.  Reviewed the importance of regular labs while on Simponi therapy.  Counseled patient that Simponi should be held  prior to scheduled surgery.  Counseled patient to avoid live vaccines while on Simponi.  Advised patient to get annual influenza vaccine and the pneumococcal vaccine as indicated.  Provided patient with medication education  material and answered all questions.  Patient voiced understanding.  Patient consented to Simponi.  Will upload consent into the media tab.  Will submit benefits investigation for Simponi Aria.    High risk medication use - Plan to apply for Simponi Aria infusions.  Inadequate response to Humira  and enbrel . Patient initiated Humira  on 08/12/2022--last dose administered today on 04/13/24. CBC and CMP updated on 11/18/23. Orders for CBC and CMP released today.  He will start to have routine lab work drawn with his infusions. Lipid panel drawn on 06/10/2023-triglycerides 387, LDL 81, HDL 26, total cholesterol 604.  Lipid panel updated today. TB gold negative on 11/18/23.   Discussed the importance of postponing Simponi Aria infusions if he develops signs or symptoms of an infection and to resume once the infection has completely cleared.  - Plan: CBC with Differential/Platelet, Comprehensive metabolic panel with GFR, Lipid panel  Pancytopenia (HCC): CBC within normal limits on 11/18/2023.  CBC with differential updated today.  Lipid screening -Order for lipid panel released today.  Plan: Lipid panel  Psoriasis: No active psoriasis at this time.   Primary osteoarthritis of both hands: Patient experiences intermittent pain and stiffness involving both hands.  Inflammation was noted in the left first PIP joint today.  Contracture of joint of both elbows: Flexion contractures of both elbows noted.  Tenderness and inflammation noted along the right elbow joint line.   Primary osteoarthritis of both knees: Painful ROM of both knees with warmth and crepitus noted bilaterally.  Patient continues to experience recurrent flares involving both knees.  He has been taking diclofenac  50 mg 1  tablet twice daily for pain relief.  Primary osteoarthritis of both feet: Chronic pain in both ankles.   Other medical conditions are listed as follows:  Late latent syphilis  HIV disease (HCC)  History of MRSA infection  Cigarette smoker  Neuropathy: Patient mains on gabapentin  as prescribed.    Orders: Orders Placed This Encounter  Procedures   CBC with Differential/Platelet   Comprehensive metabolic panel with GFR   Lipid panel   No orders of the defined types were placed in this encounter.    Follow-Up Instructions: Return in about 8 months (around 12/14/2024) for Rheumatoid arthritis.   Romayne Clubs, PA-C  Note - This record has been created using Dragon software.  Chart creation errors have been sought, but may not always  have been located. Such creation errors do not reflect on  the standard of medical care.

## 2024-04-12 NOTE — Telephone Encounter (Signed)
 Last Fill: 11/18/2023  Labs: 11/18/2023 Glucose is 138.  Globulin is borderline elevated. Rest of CMP stable. CBC WNL.   Next Visit: 04/13/2024  Last Visit: 11/18/2023  DX:  Seropositive rheumatoid arthritis    Current Dose per office note 11/18/2023: diclofenac  50 mg 1 tablet twice daily for pain relief.   Okay to refill Diclofenac ?

## 2024-04-12 NOTE — Progress Notes (Signed)
 Specialty Pharmacy Refill Coordination Note  Patrick Huber is a 53 y.o. male contacted today regarding refills of specialty medication(s) Adalimumab  (Humira  (2 Pen))   Patient requested Delivery   Delivery date: 04/22/24   Verified address: [address] [place]   Medication will be filled on 05.21.25.

## 2024-04-13 ENCOUNTER — Ambulatory Visit: Attending: Physician Assistant | Admitting: Physician Assistant

## 2024-04-13 ENCOUNTER — Encounter: Payer: Self-pay | Admitting: Physician Assistant

## 2024-04-13 VITALS — BP 134/85 | HR 80 | Resp 16 | Ht 73.0 in | Wt 208.0 lb

## 2024-04-13 DIAGNOSIS — Z79899 Other long term (current) drug therapy: Secondary | ICD-10-CM | POA: Diagnosis not present

## 2024-04-13 DIAGNOSIS — B20 Human immunodeficiency virus [HIV] disease: Secondary | ICD-10-CM

## 2024-04-13 DIAGNOSIS — M19041 Primary osteoarthritis, right hand: Secondary | ICD-10-CM | POA: Diagnosis not present

## 2024-04-13 DIAGNOSIS — A528 Late syphilis, latent: Secondary | ICD-10-CM

## 2024-04-13 DIAGNOSIS — M24522 Contracture, left elbow: Secondary | ICD-10-CM

## 2024-04-13 DIAGNOSIS — M19072 Primary osteoarthritis, left ankle and foot: Secondary | ICD-10-CM

## 2024-04-13 DIAGNOSIS — D61818 Other pancytopenia: Secondary | ICD-10-CM

## 2024-04-13 DIAGNOSIS — M059 Rheumatoid arthritis with rheumatoid factor, unspecified: Secondary | ICD-10-CM

## 2024-04-13 DIAGNOSIS — M24521 Contracture, right elbow: Secondary | ICD-10-CM

## 2024-04-13 DIAGNOSIS — M170 Bilateral primary osteoarthritis of knee: Secondary | ICD-10-CM

## 2024-04-13 DIAGNOSIS — F17210 Nicotine dependence, cigarettes, uncomplicated: Secondary | ICD-10-CM

## 2024-04-13 DIAGNOSIS — Z8614 Personal history of Methicillin resistant Staphylococcus aureus infection: Secondary | ICD-10-CM

## 2024-04-13 DIAGNOSIS — M19071 Primary osteoarthritis, right ankle and foot: Secondary | ICD-10-CM

## 2024-04-13 DIAGNOSIS — L409 Psoriasis, unspecified: Secondary | ICD-10-CM | POA: Diagnosis not present

## 2024-04-13 DIAGNOSIS — G629 Polyneuropathy, unspecified: Secondary | ICD-10-CM

## 2024-04-13 DIAGNOSIS — M19042 Primary osteoarthritis, left hand: Secondary | ICD-10-CM

## 2024-04-13 DIAGNOSIS — Z13220 Encounter for screening for lipoid disorders: Secondary | ICD-10-CM

## 2024-04-13 NOTE — Patient Instructions (Addendum)
 Golimumab Injection What is this medication? GOLIMUMAB (goe LIM ue mab) treats autoimmune conditions, such as arthritis and ulcerative colitis. It works by slowing down an overactive immune system. It belongs to a group of medications called TNF inhibitors. It is a monoclonal antibody. This medicine may be used for other purposes; ask your health care provider or pharmacist if you have questions. COMMON BRAND NAME(S): Simponi, SIMPONI ARIA What should I tell my care team before I take this medication? They need to know if you have any of these conditions: Cancer Diabetes Guillain-Barre syndrome Heart failure Hepatitis B or history of hepatitis B infection Immune system problems Infection or history of infections Low blood counts, such as low white cell, platelet, or red cell counts Multiple sclerosis Psoriasis Recently received or scheduled to receive a vaccine Tuberculosis, a positive skin test for tuberculosis, or recent close contact with someone who has tuberculosis An unusual or allergic reaction to golimumab, other medications, latex, rubber, foods, dyes, or preservatives Pregnant or trying to get pregnant Breast-feeding How should I use this medication? This medication is injected into a vein or under the skin. It is usually given by your care team in a hospital or clinic setting. It may also be given at home. If you get this medication at home, you will be taught how to prepare and give it. Use exactly as directed. Take it as directed on the prescription label at the same time every day. Keep taking it unless your care team tells you to stop. It is important that you put your used injectors, needles, and syringes in a special sharps container. Do not put them in a trash can. If you do not have a sharps container, call your pharmacist or care team to get one. A special MedGuide will be given to you by the pharmacist with each prescription and refill. Be sure to read this information  carefully each time. If you get this medication in a hospital or clinic setting, a special MedGuide will be given to you before each treatment. Be sure to read this information carefully each time. Talk to your care team about the use of this medication in children. While it may be prescribed for children as young as 2 years for selected conditions, precautions do apply. Overdosage: If you think you have taken too much of this medicine contact a poison control center or emergency room at once. NOTE: This medicine is only for you. Do not share this medicine with others. What if I miss a dose? If you get this medication at the hospital or clinic: It is important not to miss your dose. Call your care team if you are unable to keep an appointment. If you give yourself this medication at home: If you miss a dose, take it as soon as you can. If it is almost time for your next dose, take only that dose. Do not take double or extra doses. Call your care team with questions. What may interact with this medication? Do not take this medication with any of the following: Abatacept Adalimumab  Anakinra Certolizumab Etanercept  Infliximab Live virus vaccines Rilonacept Rituximab Tocilizumab This medication may also interact with the following: Cyclosporine Theophylline Vaccines Warfarin This list may not describe all possible interactions. Give your health care provider a list of all the medicines, herbs, non-prescription drugs, or dietary supplements you use. Also tell them if you smoke, drink alcohol, or use illegal drugs. Some items may interact with your medicine. What should I watch for while  using this medication? Visit your care team for regular checks on your progress. Tell your care team if your symptoms do not start to get better or if they get worse. You will be tested for tuberculosis (TB) before you start this medication. If your care team prescribes any medication for TB, you should start  taking the TB medication before starting this medication. Make sure to finish the full course of TB medication. This medication may increase your risk of getting an infection. Call you care team if you get a fever, chills, sore throat, or other symptoms of a cold or flu. Do not treat yourself. Try to avoid being around people who are sick. Talk to your care team about your risk of cancer. You may be more at risk for certain types of cancers if you take this medication. What side effects may I notice from receiving this medication? Side effects that you should report to your care team as soon as possible: Allergic reactions--skin rash, itching, hives, swelling of the face, lips, tongue, or throat Aplastic anemia--unusual weakness or fatigue, dizziness, headache, trouble breathing, increased bleeding or bruising Body pain, tingling, or numbness Heart failure--shortness of breath, swelling of the ankles, feet, or hands, sudden weight gain, unusual weakness or fatigue Infection--fever, chills, cough, sore throat, wounds that don't heal, pain or trouble when passing urine, general feeling of discomfort or being unwell Lupus-like syndrome--joint pain, swelling, or stiffness, butterfly-shaped rash on the face, rashes that get worse in the sun, fever, unusual weakness or fatigue Unusual bruising or bleeding Side effects that usually do not require medical attention (report to your care team if they continue or are bothersome): Dizziness Fever Increase in blood pressure Runny or stuffy nose Sore throat This list may not describe all possible side effects. Call your doctor for medical advice about side effects. You may report side effects to FDA at 1-800-FDA-1088. Where should I keep my medication? Infusions will be given in a hospital or clinic. They will not be stored at home. Storage for syringes or injectors given under the skin and stored at home: Keep out of the reach of children and  pets. Refrigeration (preferred): Store in the refrigerator. Do not freeze. Do not shake. Keep this medication in the original container. Protect from light. Get rid of any unused medication after the expiration date. Room Temperature: This medication may be stored at room temperature between 20 and 25 degrees C (68 and 77 degrees F) for up to 30 days. Do not put the medication back in the refrigerator after it reaches room temperature. Keep this medication in the original container. Protect from light. Do not freeze. Do not shake. If it is stored at room temperature, get rid any unused medication after 30 days or after it expires, whichever comes first. To get rid of medications that are no longer needed or have expired: Take the medication to a medication take-back program. Check with your pharmacy or law enforcement to find a location. If you cannot return the medication, ask your pharmacist or care team how to get rid of this medication safely. NOTE: This sheet is a summary. It may not cover all possible information. If you have questions about this medicine, talk to your doctor, pharmacist, or health care provider.  2024 Elsevier/Gold Standard (2022-02-01 00:00:00)  Vaccines You are taking a medication(s) that can suppress your immune system.  The following immunizations are recommended: Flu annually Covid-19  Td/Tdap (tetanus, diphtheria, pertussis) every 10 years Pneumonia (Prevnar 15 then  Pneumovax 23 at least 1 year apart.  Alternatively, can take Prevnar 20 without needing additional dose) Shingrix: 2 doses from 4 weeks to 6 months apart  Please check with your PCP to make sure you are up to date.   If you have signs or symptoms of an infection or start antibiotics: First, call your PCP for workup of your infection. Hold your medication through the infection, until you complete your antibiotics, and until symptoms resolve if you take the following: Injectable medication (Actemra,  Benlysta, Cimzia, Cosentyx, Enbrel , Humira , Kevzara, Orencia, Remicade, Simponi, Stelara, Taltz, Tremfya) Methotrexate Leflunomide (Arava) Mycophenolate (Cellcept) Cloria Danger, Olumiant, or Rinvoq

## 2024-04-14 ENCOUNTER — Encounter: Payer: Self-pay | Admitting: Physician Assistant

## 2024-04-14 ENCOUNTER — Ambulatory Visit: Payer: Self-pay | Admitting: Physician Assistant

## 2024-04-14 ENCOUNTER — Other Ambulatory Visit: Payer: Self-pay | Admitting: Pharmacist

## 2024-04-14 ENCOUNTER — Telehealth: Payer: Self-pay | Admitting: Pharmacy Technician

## 2024-04-14 DIAGNOSIS — Z111 Encounter for screening for respiratory tuberculosis: Secondary | ICD-10-CM | POA: Insufficient documentation

## 2024-04-14 LAB — CBC WITH DIFFERENTIAL/PLATELET
Absolute Lymphocytes: 3413 {cells}/uL (ref 850–3900)
Absolute Monocytes: 358 {cells}/uL (ref 200–950)
Basophils Absolute: 33 {cells}/uL (ref 0–200)
Basophils Relative: 0.5 %
Eosinophils Absolute: 91 {cells}/uL (ref 15–500)
Eosinophils Relative: 1.4 %
HCT: 43.7 % (ref 38.5–50.0)
Hemoglobin: 15.1 g/dL (ref 13.2–17.1)
MCH: 31.4 pg (ref 27.0–33.0)
MCHC: 34.6 g/dL (ref 32.0–36.0)
MCV: 90.9 fL (ref 80.0–100.0)
MPV: 9.4 fL (ref 7.5–12.5)
Monocytes Relative: 5.5 %
Neutro Abs: 2607 {cells}/uL (ref 1500–7800)
Neutrophils Relative %: 40.1 %
Platelets: 218 10*3/uL (ref 140–400)
RBC: 4.81 10*6/uL (ref 4.20–5.80)
RDW: 14.4 % (ref 11.0–15.0)
Total Lymphocyte: 52.5 %
WBC: 6.5 10*3/uL (ref 3.8–10.8)

## 2024-04-14 LAB — COMPREHENSIVE METABOLIC PANEL WITH GFR
AG Ratio: 1.2 (calc) (ref 1.0–2.5)
ALT: 20 U/L (ref 9–46)
AST: 15 U/L (ref 10–35)
Albumin: 4.4 g/dL (ref 3.6–5.1)
Alkaline phosphatase (APISO): 93 U/L (ref 35–144)
BUN: 11 mg/dL (ref 7–25)
CO2: 33 mmol/L — ABNORMAL HIGH (ref 20–32)
Calcium: 8.9 mg/dL (ref 8.6–10.3)
Chloride: 99 mmol/L (ref 98–110)
Creat: 0.85 mg/dL (ref 0.70–1.30)
Globulin: 3.8 g/dL — ABNORMAL HIGH (ref 1.9–3.7)
Glucose, Bld: 113 mg/dL — ABNORMAL HIGH (ref 65–99)
Potassium: 4.2 mmol/L (ref 3.5–5.3)
Sodium: 136 mmol/L (ref 135–146)
Total Bilirubin: 0.5 mg/dL (ref 0.2–1.2)
Total Protein: 8.2 g/dL — ABNORMAL HIGH (ref 6.1–8.1)
eGFR: 105 mL/min/{1.73_m2} (ref >=60)

## 2024-04-14 LAB — LIPID PANEL
Cholesterol: 131 mg/dL (ref <200)
HDL: 24 mg/dL — ABNORMAL LOW (ref >=40)
LDL Cholesterol (Calc): 65 mg/dL
Non-HDL Cholesterol (Calc): 107 mg/dL (ref <130)
Total CHOL/HDL Ratio: 5.5 (calc) — ABNORMAL HIGH (ref <5.0)
Triglycerides: 350 mg/dL — ABNORMAL HIGH (ref <150)

## 2024-04-14 NOTE — Progress Notes (Signed)
 Therapy plan placed for Simponi Aria [phone]) for [facility] to start benefits investigation  Diagnosis: RA and PsO  Provider: Dr. Nicholas Bari  Dose: 2mg /kg at Week 0, Week 4, then every 8 weeks thereafter Pre-medications: acetaminophen  650mg  and diphenhydramine 25mg   Last Clinic Visit: 04/13/2024 Next Clinic Visit: 07/14/2024  Pertinent Labs: CBC and CMP on 04/13/24; TB gold negative on 11/18/23  Geraldene Kleine, PharmD, MPH, BCPS, CPP Clinical Pharmacist (Rheumatology and Pulmonology)

## 2024-04-14 NOTE — Telephone Encounter (Signed)
 Auth Submission: NO AUTH NEEDED Site of care: Site of care: [facility] Payer: [employer] Medication & CPT/J Code(s) submitted: Simponi aria (Golimumab) [id number] Route of submission (phone, fax, portal): PORTAL Phone # Fax # Auth type: Buy/Bill PB Units/visits requested: Q8WKS Reference number: [id number] Approval from: 04/14/24 to 12/01/24

## 2024-04-14 NOTE — Progress Notes (Signed)
 CBC WNL CMP stable.  Lipid panel: TG remain elevated and HDL is low--please notify the patient and forward results to PCP

## 2024-04-14 NOTE — Progress Notes (Signed)
 The 10-year ASCVD risk score (Arnett DK, et al., 2019) is: 11%   Values used to calculate the score:     Age: 53 years     Sex: Male     Is Non-Hispanic African American: No     Diabetic: No     Tobacco smoker: Yes     Systolic Blood Pressure: 134 mmHg     Is BP treated: No     HDL Cholesterol: 24 mg/dL     Total Cholesterol: 131 mg/dL  No current statin therapy. Next appointment note updated.   Myles Tavella, BSN, RN

## 2024-04-16 NOTE — Progress Notes (Signed)
 First Simponi Aria infusion scheduled for 04/20/2024

## 2024-04-20 ENCOUNTER — Encounter: Payer: Self-pay | Admitting: Physician Assistant

## 2024-04-20 ENCOUNTER — Ambulatory Visit (INDEPENDENT_AMBULATORY_CARE_PROVIDER_SITE_OTHER)

## 2024-04-20 VITALS — BP 130/82 | HR 83 | Temp 97.9°F | Resp 16 | Ht 73.0 in | Wt 208.2 lb

## 2024-04-20 DIAGNOSIS — Z111 Encounter for screening for respiratory tuberculosis: Secondary | ICD-10-CM

## 2024-04-20 DIAGNOSIS — Z79899 Other long term (current) drug therapy: Secondary | ICD-10-CM | POA: Diagnosis not present

## 2024-04-20 DIAGNOSIS — L409 Psoriasis, unspecified: Secondary | ICD-10-CM | POA: Diagnosis not present

## 2024-04-20 DIAGNOSIS — M059 Rheumatoid arthritis with rheumatoid factor, unspecified: Secondary | ICD-10-CM | POA: Diagnosis not present

## 2024-04-20 MED ORDER — DIPHENHYDRAMINE HCL 25 MG PO CAPS
25.0000 mg | ORAL_CAPSULE | Freq: Once | ORAL | Status: AC
  Administered 2024-04-20: 25 mg via ORAL
  Filled 2024-04-20: qty 1

## 2024-04-20 MED ORDER — SODIUM CHLORIDE 0.9 % IV SOLN
200.0000 mg | Freq: Once | INTRAVENOUS | Status: AC
  Administered 2024-04-20: 200 mg via INTRAVENOUS
  Filled 2024-04-20: qty 16

## 2024-04-20 MED ORDER — ACETAMINOPHEN 325 MG PO TABS
650.0000 mg | ORAL_TABLET | Freq: Once | ORAL | Status: AC
  Administered 2024-04-20: 650 mg via ORAL
  Filled 2024-04-20: qty 2

## 2024-04-20 NOTE — Progress Notes (Signed)
 Diagnosis: Rheumatoid Arthritis  Provider:  Phyllis Breeze MD  Procedure: IV Infusion  IV Type: Peripheral, IV Location: R Hand  Simponi Aria (Golimumab), Dose: 200 mg  Infusion Start Time: 1525  Infusion Stop Time: 1542  Post Infusion IV Care: Observation period completed and Peripheral IV Discontinued  Discharge: Condition: Good, Destination: Home . AVS Provided  Performed by:  Star East, LPN

## 2024-04-21 ENCOUNTER — Other Ambulatory Visit: Payer: Self-pay

## 2024-04-22 ENCOUNTER — Other Ambulatory Visit: Payer: Self-pay | Admitting: Pharmacist

## 2024-04-22 NOTE — Addendum Note (Signed)
 Addended by: Thais Fill on: 04/22/2024 08:32 AM   Modules accepted: Orders

## 2024-04-22 NOTE — Progress Notes (Signed)
 Patient switched from Humira  to Simponi Aria infusions. Dis-enrolling from specialty pharmacy outreach queue.  Geraldene Kleine, PharmD, MPH, BCPS, CPP Clinical Pharmacist (Rheumatology and Pulmonology)

## 2024-05-18 ENCOUNTER — Ambulatory Visit

## 2024-05-18 VITALS — BP 131/85 | HR 71 | Temp 97.8°F | Resp 16 | Ht 73.0 in | Wt 209.2 lb

## 2024-05-18 DIAGNOSIS — M059 Rheumatoid arthritis with rheumatoid factor, unspecified: Secondary | ICD-10-CM

## 2024-05-18 DIAGNOSIS — Z111 Encounter for screening for respiratory tuberculosis: Secondary | ICD-10-CM

## 2024-05-18 DIAGNOSIS — L409 Psoriasis, unspecified: Secondary | ICD-10-CM

## 2024-05-18 DIAGNOSIS — Z79899 Other long term (current) drug therapy: Secondary | ICD-10-CM

## 2024-05-18 MED ORDER — SODIUM CHLORIDE 0.9 % IV SOLN
200.0000 mg | Freq: Once | INTRAVENOUS | Status: AC
  Administered 2024-05-18: 200 mg via INTRAVENOUS
  Filled 2024-05-18: qty 16

## 2024-05-18 MED ORDER — ACETAMINOPHEN 325 MG PO TABS
650.0000 mg | ORAL_TABLET | Freq: Once | ORAL | Status: AC
  Administered 2024-05-18: 650 mg via ORAL
  Filled 2024-05-18: qty 2

## 2024-05-18 MED ORDER — DIPHENHYDRAMINE HCL 25 MG PO CAPS
25.0000 mg | ORAL_CAPSULE | Freq: Once | ORAL | Status: AC
  Administered 2024-05-18: 25 mg via ORAL
  Filled 2024-05-18: qty 1

## 2024-05-18 NOTE — Progress Notes (Signed)
 Diagnosis: Psoriasis  Provider:  Phyllis Breeze MD  Procedure: IV Infusion  IV Type: Peripheral, IV Location: R Hand  Simponi  Aria (Golimumab ), Dose: 200 mg  Infusion Start Time: 1602  Infusion Stop Time: 1633  Post Infusion IV Care: Peripheral IV Discontinued  Discharge: Condition: Good, Destination: Home . AVS Declined  Performed by:  Kalief Kattner, RN

## 2024-05-25 ENCOUNTER — Telehealth: Payer: Self-pay

## 2024-05-25 ENCOUNTER — Ambulatory Visit: Admitting: Internal Medicine

## 2024-05-25 NOTE — Telephone Encounter (Signed)
 Called patient to reschedule missed appointment, no answer left HIPAA compliant message to contact office.

## 2024-05-27 ENCOUNTER — Ambulatory Visit (INDEPENDENT_AMBULATORY_CARE_PROVIDER_SITE_OTHER): Admitting: Internal Medicine

## 2024-05-27 ENCOUNTER — Other Ambulatory Visit: Payer: Self-pay

## 2024-05-27 ENCOUNTER — Encounter: Payer: Self-pay | Admitting: Internal Medicine

## 2024-05-27 VITALS — BP 134/74 | HR 84 | Resp 16 | Ht 73.0 in | Wt 205.2 lb

## 2024-05-27 DIAGNOSIS — F17200 Nicotine dependence, unspecified, uncomplicated: Secondary | ICD-10-CM | POA: Diagnosis not present

## 2024-05-27 DIAGNOSIS — B20 Human immunodeficiency virus [HIV] disease: Secondary | ICD-10-CM

## 2024-05-27 DIAGNOSIS — Z113 Encounter for screening for infections with a predominantly sexual mode of transmission: Secondary | ICD-10-CM

## 2024-05-27 DIAGNOSIS — Z131 Encounter for screening for diabetes mellitus: Secondary | ICD-10-CM

## 2024-05-27 DIAGNOSIS — Z1159 Encounter for screening for other viral diseases: Secondary | ICD-10-CM

## 2024-05-27 DIAGNOSIS — A528 Late syphilis, latent: Secondary | ICD-10-CM

## 2024-05-27 MED ORDER — ATORVASTATIN CALCIUM 40 MG PO TABS: 40.0000 mg | ORAL_TABLET | Freq: Every day | ORAL | 11 refills | Status: AC

## 2024-05-27 MED ORDER — BICTEGRAVIR-EMTRICITAB-TENOFOV 50-200-25 MG PO TABS: 1.0000 | ORAL_TABLET | Freq: Every day | ORAL | 11 refills | Status: AC

## 2024-05-27 NOTE — Patient Instructions (Addendum)
 Blood tests today    Biktarvy  renewed I have also started lipitor 40 mg once a day for you to reduce risk stroke/heart attack. Continue your baby aspirin   Make sure you find a primary care provider  as well    See me in 3 months so I want to repeat liver function testing for lipitor use         Mental Health Resources  988: can call or text 24/7  [facility]: Address: [address] [place], [place] [address] Open 24 hours Phone: [phone]  [facility] [place]: Address: [address], [place], [place] [address] Phone: [phone] Appointments: [email or web address]   Smoking Cessation: QuitlineNC 1-800-QUIT-NOW (225)451-4731); Espaol: 1-855-Djelo-Ya (1-418-577-8481) [email or web address]

## 2024-05-27 NOTE — Progress Notes (Signed)
   Subjective:    Patient ID: Patrick Huber, male    DOB: 1971-06-03, 53 y.o.   MRN: [id number]  HPI 05/27/2024 Patrick Huber is here for follow up of HIV No missed dose biktarvy  last 4 weeks Divorced from wife 3 years ago No new sexual activity Still looking for pcp Not taking anything for diabetes Colonoscopy in 2022 was normal   RA -- still have pain in his ankles/knees/hips On plaquenil  and Golimumab  [facility] Not yet controlled in terms of joint pain Next visit next week Not on prednisone     Review of Systems  Constitutional:  Negative for fatigue.  Gastrointestinal:  Negative for diarrhea and nausea.  Skin:  Negative for rash.       Objective:   Physical Exam  Eyes:     General: No scleral icterus.  Pulmonary:     Effort: Pulmonary effort is normal.   Neurological:     Mental Status: He is alert.     Labs: Lab Results  Component Value Date   WBC 6.5 04/13/2024   HGB 15.1 04/13/2024   HCT 43.7 04/13/2024   MCV 90.9 04/13/2024   PLT 218 04/13/2024   Last metabolic panel Lab Results  Component Value Date   GLUCOSE 113 (H) 04/13/2024   NA 136 04/13/2024   K 4.2 04/13/2024   CL 99 04/13/2024   CO2 33 (H) 04/13/2024   BUN 11 04/13/2024   CREATININE 0.85 04/13/2024   EGFR 105 04/13/2024   CALCIUM 8.9 04/13/2024   PROT 8.2 (H) 04/13/2024   ALBUMIN 2.2 (L) 08/17/2018   BILITOT 0.5 04/13/2024   ALKPHOS 168 (H) 08/17/2018   AST 15 04/13/2024   ALT 20 04/13/2024   ANIONGAP 10 08/18/2018     SH: + tobacco        Assessment & Plan:   #hiv Dx'ed 2020; assymptomatic screening; heterosexual  Compliant with biktarvy    -discussed u=u -encourage compliance -continue current HIV medication -labs today -f/u in 6 months for labs   #ra On tna-a inhibitor and plaquenil  and gabapentin  and diclofenac  Sx appears not well controlled yet  -f/u rheumatology   #cad risk/reprieve trial Tobacco use Ra/hiv male >57 age high risk  cad Discuss reprieve trial  -encourage quitting tobacco -start lipitor 40 mg once a day -f/u 3 months to recheck liver function   #syphilis Treated as late latent with bicillin  x3 weekly in 2022 Serofast at titer 1:16   #soc hx: Lives in [place] Not working Still smoke No illicit drugs/etoh 2 grown children Divorced with wife 2019    #hx insulin use No A1c on file Will check No longer on any diabetic medication   #hcm -vaccine Patient reports utd but I don't yet see records -- query request and will review next visit -hepatitis 2022 serology shows no immunity Repeat 05/27/24 as might have had hep b vaccine per his report but not sure -std screen Rpr today -cancer screening Colonoscopy 2022 negative Will need prostate cancer screening and at age 86 lung cancer screening; defer to pcp

## 2024-05-28 LAB — T-HELPER CELLS (CD4) COUNT (NOT AT ARMC)
CD4 % Helper T Cell: 13 % — ABNORMAL LOW (ref 33–65)
CD4 T Cell Abs: 391 /uL — ABNORMAL LOW (ref 400–1790)

## 2024-05-30 LAB — HIV-1 RNA QUANT-NO REFLEX-BLD
HIV 1 RNA Quant: 36 {copies}/mL — ABNORMAL HIGH
HIV-1 RNA Quant, Log: 1.56 {Log_copies}/mL — ABNORMAL HIGH

## 2024-05-30 LAB — HEMOGLOBIN A1C
Hgb A1c MFr Bld: 5.5 % (ref <5.7)
Mean Plasma Glucose: 111 mg/dL
eAG (mmol/L): 6.2 mmol/L

## 2024-05-30 LAB — HEPATITIS B CORE ANTIBODY, TOTAL: Hep B Core Total Ab: NONREACTIVE

## 2024-05-30 LAB — HEPATITIS B SURFACE ANTIGEN: Hepatitis B Surface Ag: NONREACTIVE

## 2024-05-30 LAB — RPR: RPR Ser Ql: REACTIVE — AB

## 2024-05-30 LAB — T PALLIDUM AB: T Pallidum Abs: POSITIVE — AB

## 2024-05-30 LAB — HEPATITIS B SURFACE ANTIBODY, QUANTITATIVE: Hep B S AB Quant (Post): <5 m[IU]/mL — ABNORMAL LOW (ref >=10)

## 2024-05-30 LAB — RPR TITER: RPR Titer: 1:16 {titer} — ABNORMAL HIGH

## 2024-06-30 NOTE — Progress Notes (Addendum)
 Office Visit Note  Patient: Patrick Huber             Date of Birth: 12/02/1971           MRN: [id number]             PCP: Pcp, No Referring: No ref. provider found Visit Date: 07/14/2024 Occupation: @GUAROCC @  Subjective:  Pain in multiple joints   History of Present Illness: Jules Baty is a 53 y.o. male with history seropositive rheumatoid arthritis and osteoarthritis.  Patient was started on Simponi  Aria on 04/20/2024.  He has tolerated Simponi  Aria but states that he only receives about 2-1/2 weeks of relief after receiving the infusion.  He has been experiencing severe recurrent flares involving multiple joints.  Patient states that his symptoms were better controlled while on Humira  despite having breakthrough symptoms prior to each dose in the past.  He is currently having severe pain and swelling in the left knee, right ankle, both elbows, and both hips.  He is having difficulty walking due to severity of symptoms.  He is currently having to use a cane.  Patient states he has been taking Goody powder over-the-counter since the pharmacy has not dispensed his prescription for diclofenac  50 mg twice daily.  He has been out of this prescription for about 1 month.  Patient states that he can no longer take the level of pain he is experiencing and would like to discuss other treatment options.   Activities of Daily Living:  Patient reports morning stiffness for 1-1.5 hours.   Patient Reports nocturnal pain.  Difficulty dressing/grooming: Reports Difficulty climbing stairs: Reports Difficulty getting out of chair: Reports Difficulty using hands for taps, buttons, cutlery, and/or writing: Reports  Review of Systems  Constitutional:  Positive for fatigue.  HENT:  Negative for mouth sores and mouth dryness.   Eyes:  Positive for dryness.  Respiratory:  Negative for shortness of breath.   Cardiovascular:  Negative for chest pain and palpitations.  Gastrointestinal:  Negative for  blood in stool, constipation and diarrhea.  Endocrine: Negative for increased urination.  Genitourinary:  Negative for involuntary urination.  Musculoskeletal:  Positive for joint pain, gait problem, joint pain, joint swelling, myalgias, muscle weakness, morning stiffness, muscle tenderness and myalgias.  Skin:  Negative for color change, rash, hair loss and sensitivity to sunlight.  Allergic/Immunologic: Negative for susceptible to infections.  Neurological:  Negative for dizziness and headaches.  Hematological:  Negative for swollen glands.  Psychiatric/Behavioral:  Negative for depressed mood and sleep disturbance. The patient is not nervous/anxious.     PMFS History:  Patient Active Problem List   Diagnosis Date Noted   Screening for tuberculosis 04/14/2024   Encounter for long-term (current) use of high-risk medication 06/10/2023   Disorder involving the immune mechanism, unspecified (HCC) 06/20/2022   Essential hypertension 06/20/2022   Hyperlipidemia 06/20/2022   Hypothyroidism 06/20/2022   Overweight (BMI 25.0-29.9) 05/28/2022   Chronic, continuous use of opioids 02/26/2022   Pain medication agreement 02/05/2022   Anemia 11/09/2018   Elevated liver enzymes 11/09/2018   Neuropathy 10/07/2018   Bilateral lower extremity edema 09/08/2018   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   Malnutrition of moderate degree 08/18/2018   HIV disease (HCC) 08/18/2018   Unintentional weight loss 08/18/2018   Rheumatoid arthritis (HCC) 08/18/2018   Knee mass, right 08/18/2018   Pancytopenia (HCC) 08/18/2018   Cigarette smoker 08/18/2018   GERD (gastroesophageal reflux disease) 08/18/2018   Psoriasis 08/18/2018   History of  MRSA infection 08/18/2018   Neoplasm 06/21/2018   Mass 04/08/2018    Past Medical History:  Diagnosis Date   Angina pectoris (HCC)    HIV (human immunodeficiency virus infection) (HCC)    Seropositive rheumatoid arthritis (HCC)     Family History  Problem Relation Age  of Onset   Heart disease Mother    Cancer Mother    Cancer Father    Heart disease Father    Cancer Sister    Heart disease Brother    Healthy Son    Healthy Daughter    Past Surgical History:  Procedure Laterality Date   KNEE ARTHROSCOPY Left    Right knee surgery     Mass - at [facility]   Social History   Social History Narrative   Not on file   Immunization History  Administered Date(s) Administered   Influenza,inj,Quad PF,6+ Mos 09/02/2019     Objective: Vital Signs: BP 113/71 (BP Location: Left Arm, Patient Position: Sitting, Cuff Size: Normal)   Pulse 93   Resp 16   Ht 6' 1 (1.854 m)   Wt 202 lb (91.6 kg)   BMI 26.65 kg/m    Physical Exam Vitals and nursing note reviewed.  Constitutional:      Appearance: He is well-developed.  HENT:     Head: Normocephalic and atraumatic.  Eyes:     Conjunctiva/sclera: Conjunctivae normal.     Pupils: Pupils are equal, round, and reactive to light.  Cardiovascular:     Rate and Rhythm: Normal rate and regular rhythm.     Heart sounds: Normal heart sounds.  Pulmonary:     Effort: Pulmonary effort is normal.     Breath sounds: Normal breath sounds.  Abdominal:     General: Bowel sounds are normal.     Palpations: Abdomen is soft.  Musculoskeletal:     Cervical back: Normal range of motion and neck supple.  Skin:    General: Skin is warm and dry.     Capillary Refill: Capillary refill takes less than 2 seconds.  Neurological:     Mental Status: He is alert and oriented to person, place, and time.  Psychiatric:        Behavior: Behavior normal.     Musculoskeletal Exam: C-spine has painful limited range of motion with lateral rotation.  Shoulder joints have painful limited range of motion to about 120 degrees of abduction.  Limited range of motion with internal rotation.  Flexor contractures of both elbows with tenderness and synovitis of the left elbow.  Limited range of motion of both wrist joints with mild tenderness  bilaterally.  No tenderness or synovitis over MCP joints currently.  Painful and limited range of motion of both hips.  Moderate to large effusion of the left knee.  Tenderness and swelling of the right ankle joints. No tenderness of MTP joints.   CDAI Exam: CDAI Score: -- Patient Global: --; Provider Global: -- Swollen: 4 ; Tender: 6  Joint Exam 07/14/2024      Right  Left  Elbow  Swollen Tender  Swollen Tender  Hip   Tender   Tender  Knee     Swollen Tender  Ankle  Swollen Tender        Investigation: No additional findings.  Imaging: No results found.  Recent Labs: Lab Results  Component Value Date   WBC 6.5 04/13/2024   HGB 15.1 04/13/2024   PLT 218 04/13/2024   NA 136 04/13/2024   K 4.2  04/13/2024   CL 99 04/13/2024   CO2 33 (H) 04/13/2024   GLUCOSE 113 (H) 04/13/2024   BUN 11 04/13/2024   CREATININE 0.85 04/13/2024   BILITOT 0.5 04/13/2024   ALKPHOS 168 (H) 08/17/2018   AST 15 04/13/2024   ALT 20 04/13/2024   PROT 8.2 (H) 04/13/2024   ALBUMIN 2.2 (L) 08/17/2018   CALCIUM  8.9 04/13/2024   GFRAA 129 12/28/2018   QFTBGOLDPLUS NEGATIVE 11/18/2023    Speciality Comments: Enbrel  stopped 08/04/22 Humira  started 08/12/22 Simponi  Aria started 04/20/24  Procedures:  Large Joint Inj on 07/14/2024 11:52 AM Indications: pain Details: 22 G 1.5 in needle, lateral approach  Arthrogram: No  Medications: 3 mL lidocaine  1 %; 40 mg triamcinolone  acetonide 40 MG/ML Aspirate: 31.5 mL clear Outcome: tolerated well, no immediate complications  Risk of infection, tendon injury, nerve injury, hypopigmentation and dermal atrophy were discussed. Procedure, treatment alternatives, risks and benefits explained, specific risks discussed. Consent was given by the patient. Immediately prior to procedure a time out was called to verify the correct patient, procedure, equipment, support staff and site/side marked as required. Patient was prepped and draped in the usual sterile fashion.      Allergies: Meperidine and Meperidine hcl   Assessment / Plan:     Visit Diagnoses: Seropositive rheumatoid arthritis (HCC) - Anti-CCP>250, RF 20, ESR 72, Bilateral knee swelling, bilateral elbow joint contractures, +synovitis: Patient presents today experiencing a flare involving multiple joints.  His symptoms have been most severe involving both elbows, both hips, left knee, and the right ankle.  He is having significant difficulty walking due to the severity of pain in his hips, left knee, and right ankle.  He has been using a cane to assist with ambulation.  On examination he has a moderate to large effusion of the right knee as well as active inflammation of the right ankle.  He has been out of his prescription for oral diclofenac  for the past month.  He was started on Simponi  Aria infusions on 04/20/2024 which he has tolerated.  Patient has only been getting relief for about 2 to 2-1/2 weeks after the Simponi  Aria infusion.  Patient was due for his infusion yesterday but did not go due to not wanting the infusion to delay future treatment options.  He does not want to continue Simponi  Aria at this time.  He presents today to discuss treatment alternatives due to severity of symptoms. To alleviate his current symptoms a left knee joint aspiration was performed and 31 mm of synovial fluid was drawn off.  Synovial analysis will be performed.  A cortisone injection was performed.  He tolerated procedure well.  Procedure note was completed above.  Aftercare was discussed. A prednisone  taper starting at 20 mg tapering by 5 mg every 7 days was sent to the pharmacy today.  Patient was advised to hold diclofenac  and all other NSAIDs while taking prednisone .  He voiced understanding.  He was advised to take prednisone  with food in the morning. He will follow-up in the office in 6 to 8 weeks or sooner if needed. Reviewed different treatment options today in detail.  Reviewed indications, contraindications,  potential side effects of Actemra today.  All questions were addressed and consent was obtained.  Plan to obtain clearance from ID as well as insurance approval prior to initiating subcutaneous Actemra in the office. Message sent to Dr. Overton for clearance from an ID standpoint.  He will follow-up in the office in 6 to 8 weeks to  assess his response.  Medication counseling:  Baseline Immunosuppressant Therapy Labs     Latest Ref Rng & Units 11/18/2023    1:58 PM  Quantiferon TB Gold  Quantiferon TB Gold Plus NEGATIVE NEGATIVE        Latest Ref Rng & Units 05/27/2024    4:37 PM  Hepatitis  Hep B Surface Ag NON-REACTIVE NON-REACTIVE     Lab Results  Component Value Date   HIV REPEATEDLY REACTIVE (A) 12/28/2018   HIV Reactive (A) 08/17/2018       Latest Ref Rng & Units 07/30/2021    8:56 AM  Immunoglobulin Electrophoresis  IgA  47 - 310 mg/dL 709   IgG 399 - 8,359 mg/dL 7,618   IgM 50 - 699 mg/dL 885        Latest Ref Rng & Units 04/13/2024    1:17 PM  Serum Protein Electrophoresis  Total Protein 6.1 - 8.1 g/dL 8.2     Lab Results  Component Value Date   G6PDH 18.5 07/30/2021    Lab Results  Component Value Date   TPMT 18 07/30/2021     Lipid Panel Lab Results  Component Value Date   CHOL 131 04/13/2024   HDL 24 (L) 04/13/2024   LDLCALC 65 04/13/2024   TRIG 350 (H) 04/13/2024   CHOLHDL 5.5 (H) 04/13/2024     Chest x-ray: No active disease 08/19/18   Counseled patient that Actemra is an IL-6 blocking agent.   Counseled patient on purpose, proper use, and adverse effects of Actemra.  Reviewed the most common adverse effects including infections, injection site reaction, bowel injury, and rarely cancer and conditions of the nervous system.  Reviewed that the medication should be held during infections.  Discussed that there is the possibility of an increased risk of malignancy but it is not well understood if this increased risk is due to the medication or the  disease state.  Counseled patient that Actemra should be held prior to scheduled surgery.  Counseled patient to avoid live vaccines while on Actemra.  Recommend annual influenza, Pneumovax 23, Prevnar 13, and Shingrix as indicated.   Reviewed the importance of regular labs while on Actemra therapy including the need for routine lipid panel.  Advised patient to get standing labs one month after starting Actemra.  Provided patient with standing lab orders. P rovided patient with medication education material and answered all questions.  Patient voiced understanding.  Patient consented to Actemra.  Will upload consent into the media tab.  Reviewed storage instructions of Actemra with patient.  Advised patient that initial Actemra injection must be given in the office.  Will apply for Actemra through patient's insurance.    Patient dose will be  162 mg every 14 days based on weight <100 kg .  Prescription pending lab results and/or insurance approval.  High risk medication use - Plan to apply for Actemra 162 mg sq injections every 14 days.  Previous therapy: Humira , Enbrel , Simponi  Aria CBC and CMP updated on 5/13//25.Orders for CBC and CMP released today.  He will require updated lab work in 1 month then every 3 months.  TB gold negative on 11/18/23. Lipid updated on 04/13/24. Future order for lipid panel placed today.  Discussed the importance of holding Actemra if he develops signs or symptoms of an infection and to resume once the infection has completely cleared.  - Plan: CBC with Differential/Platelet, Comprehensive metabolic panel with GFR  Psoriasis: No active psoriasis at this time.  Primary osteoarthritis of both hands: Tenderness of both wrist joints but no active synovitis noted.  Contracture of joint of both elbows: Flexion contractures of both elbows--tenderness and inflammation of both elbows, left more severe than right.  A prednisone  taper was sent to the pharmacy today.  Primary  osteoarthritis of both knees: Patient presents today with increased pain in both knee joints, left greater than right.  A moderate to large effusion of the left knee was noted.  An aspiration and cortisone injection was performed today in the office.  Synovial fluid analysis ordered.   Procedure note was completed above.  Aftercare was discussed.  He was advised to notify us  if his symptoms persist or worsen.  A prednisone  taper was sent to the pharmacy today.  Plan to switch Biologics as discussed above.   Effusion, left knee: Moderate to large effusion of left knee.  Aspiration and cortisone injection were performed.  31 L of clear fluid was aspirated.  Synovial analysis was ordered.  He tolerated procedure well.  Procedure was completed above.  Aftercare was discussed.  Primary osteoarthritis of both feet: Chronic pain in the right ankle-tenderness and inflammation noted.   Other medical conditions are listed as follows:   Late latent syphilis:  Plan to reach out to his infectious disease specialist Dr. Overton for clearance to start Sq Actemra.    HIV disease [facility]): Plan to reach out to his infectious disease specialist Dr. Overton for clearance to start Sq Actemra.    History of MRSA infection  Cigarette smoker  Neuropathy  Pancytopenia (HCC): CBC with differential within normal limits on 04/13/2024.  Orders: Orders Placed This Encounter  Procedures   Large Joint Inj   CBC with Differential/Platelet   Comprehensive metabolic panel with GFR   Lipid panel   Meds ordered this encounter  Medications   predniSONE  (DELTASONE ) 5 MG tablet    Sig: Take 4 tabs po x 7 days, 3  tabs po x 7 days, 2  tabs po x 7 days, 1  tab po x 7 days    Dispense:  70 tablet    Refill:  0     Follow-Up Instructions: Return in about 8 weeks (around 09/08/2024) for Rheumatoid arthritis, Osteoarthritis.   Waddell CHRISTELLA Craze, PA-C  Note - This record has been created using Dragon software.  Chart creation errors  have been sought, but may not always  have been located. Such creation errors do not reflect on  the standard of medical care.

## 2024-07-12 ENCOUNTER — Other Ambulatory Visit: Payer: Self-pay | Admitting: Pharmacist

## 2024-07-12 ENCOUNTER — Encounter: Payer: Self-pay | Admitting: Physician Assistant

## 2024-07-12 NOTE — Progress Notes (Signed)
 Therapy plan placed for Simponi  Aria [phone]) for [facility] to start benefits investigation  Diagnosis: RA and PsO  Provider: Dr. Maya Nash  Dose: 2mg /kg at Week 0, Week 4, then every 8 weeks thereafter Pre-medications: acetaminophen  650mg  and diphenhydramine  25mg   Last Clinic Visit: 04/13/2024 Next Clinic Visit: 07/14/2024  Pertinent Labs: CBC and CMP on 04/13/24; TB gold negative on 11/18/23; next due Dec 2025  Sherry Pennant, PharmD, MPH, BCPS, CPP Clinical Pharmacist (Rheumatology and Pulmonology)

## 2024-07-13 ENCOUNTER — Telehealth (HOSPITAL_COMMUNITY): Payer: Self-pay | Admitting: Pharmacy Technician

## 2024-07-13 ENCOUNTER — Ambulatory Visit

## 2024-07-13 MED ORDER — DIPHENHYDRAMINE HCL 25 MG PO CAPS: 25.0000 mg | ORAL_CAPSULE | Freq: Once | ORAL | Status: DC

## 2024-07-13 MED ORDER — ACETAMINOPHEN 325 MG PO TABS
650.0000 mg | ORAL_TABLET | Freq: Once | ORAL | Status: DC
Start: 2024-07-13 — End: 2024-07-14

## 2024-07-13 NOTE — Telephone Encounter (Signed)
 Auth Submission: NO AUTH NEEDED Site of care: [facility] Payer: [employer] Medicare Dual complete Medication & CPT/J Code(s) submitted: Simponi  aria (Golimumab ) [id number] Diagnosis Code: L40.9, M05.9 Route of submission (phone, fax, portal): portal Phone # Fax # Auth type: Buy/Bill PB Units/visits requested: 2mg /kg q 8 weeks Reference number: [id number] Approval from: 07/13/24 to 12/01/24     Dagoberto Armour, CPhT [facility] Phone: [phone] 07/13/2024

## 2024-07-14 ENCOUNTER — Encounter: Payer: Self-pay | Admitting: Physician Assistant

## 2024-07-14 ENCOUNTER — Ambulatory Visit: Attending: Physician Assistant | Admitting: Physician Assistant

## 2024-07-14 ENCOUNTER — Ambulatory Visit: Payer: Self-pay | Admitting: Physician Assistant

## 2024-07-14 VITALS — BP 113/71 | HR 93 | Resp 16 | Ht 73.0 in | Wt 202.0 lb

## 2024-07-14 DIAGNOSIS — M25462 Effusion, left knee: Secondary | ICD-10-CM

## 2024-07-14 DIAGNOSIS — M24522 Contracture, left elbow: Secondary | ICD-10-CM

## 2024-07-14 DIAGNOSIS — F17210 Nicotine dependence, cigarettes, uncomplicated: Secondary | ICD-10-CM

## 2024-07-14 DIAGNOSIS — G629 Polyneuropathy, unspecified: Secondary | ICD-10-CM

## 2024-07-14 DIAGNOSIS — M19041 Primary osteoarthritis, right hand: Secondary | ICD-10-CM | POA: Diagnosis not present

## 2024-07-14 DIAGNOSIS — L409 Psoriasis, unspecified: Secondary | ICD-10-CM

## 2024-07-14 DIAGNOSIS — B20 Human immunodeficiency virus [HIV] disease: Secondary | ICD-10-CM

## 2024-07-14 DIAGNOSIS — Z8614 Personal history of Methicillin resistant Staphylococcus aureus infection: Secondary | ICD-10-CM

## 2024-07-14 DIAGNOSIS — Z79899 Other long term (current) drug therapy: Secondary | ICD-10-CM | POA: Diagnosis not present

## 2024-07-14 DIAGNOSIS — A528 Late syphilis, latent: Secondary | ICD-10-CM

## 2024-07-14 DIAGNOSIS — E782 Mixed hyperlipidemia: Secondary | ICD-10-CM

## 2024-07-14 DIAGNOSIS — M059 Rheumatoid arthritis with rheumatoid factor, unspecified: Secondary | ICD-10-CM

## 2024-07-14 DIAGNOSIS — M19071 Primary osteoarthritis, right ankle and foot: Secondary | ICD-10-CM

## 2024-07-14 DIAGNOSIS — M170 Bilateral primary osteoarthritis of knee: Secondary | ICD-10-CM

## 2024-07-14 DIAGNOSIS — M19042 Primary osteoarthritis, left hand: Secondary | ICD-10-CM

## 2024-07-14 DIAGNOSIS — D61818 Other pancytopenia: Secondary | ICD-10-CM

## 2024-07-14 DIAGNOSIS — M19072 Primary osteoarthritis, left ankle and foot: Secondary | ICD-10-CM

## 2024-07-14 DIAGNOSIS — M24521 Contracture, right elbow: Secondary | ICD-10-CM

## 2024-07-14 LAB — COMPREHENSIVE METABOLIC PANEL WITH GFR
AG Ratio: 1.1 (calc) (ref 1.0–2.5)
ALT: 16 U/L (ref 9–46)
AST: 13 U/L (ref 10–35)
Albumin: 3.9 g/dL (ref 3.6–5.1)
Alkaline phosphatase (APISO): 92 U/L (ref 35–144)
BUN: 9 mg/dL (ref 7–25)
CO2: 33 mmol/L — ABNORMAL HIGH (ref 20–32)
Calcium: 8.7 mg/dL (ref 8.6–10.3)
Chloride: 99 mmol/L (ref 98–110)
Creat: 0.83 mg/dL (ref 0.70–1.30)
Globulin: 3.7 g/dL (ref 1.9–3.7)
Glucose, Bld: 138 mg/dL — ABNORMAL HIGH (ref 65–99)
Potassium: 3.5 mmol/L (ref 3.5–5.3)
Sodium: 138 mmol/L (ref 135–146)
Total Bilirubin: 0.4 mg/dL (ref 0.2–1.2)
Total Protein: 7.6 g/dL (ref 6.1–8.1)
eGFR: 105 mL/min/1.73m2 (ref >=60)

## 2024-07-14 LAB — CBC WITH DIFFERENTIAL/PLATELET
Absolute Lymphocytes: 1292 {cells}/uL (ref 850–3900)
Absolute Monocytes: 299 {cells}/uL (ref 200–950)
Basophils Absolute: 21 {cells}/uL (ref 0–200)
Basophils Relative: 0.5 %
Eosinophils Absolute: 70 {cells}/uL (ref 15–500)
Eosinophils Relative: 1.7 %
HCT: 39.9 % (ref 38.5–50.0)
Hemoglobin: 12.9 g/dL — ABNORMAL LOW (ref 13.2–17.1)
MCH: 30.3 pg (ref 27.0–33.0)
MCHC: 32.3 g/dL (ref 32.0–36.0)
MCV: 93.7 fL (ref 80.0–100.0)
MPV: 8.9 fL (ref 7.5–12.5)
Monocytes Relative: 7.3 %
Neutro Abs: 2419 {cells}/uL (ref 1500–7800)
Neutrophils Relative %: 59 %
Platelets: 200 Thousand/uL (ref 140–400)
RBC: 4.26 Million/uL (ref 4.20–5.80)
RDW: 14.6 % (ref 11.0–15.0)
Total Lymphocyte: 31.5 %
WBC: 4.1 Thousand/uL (ref 3.8–10.8)

## 2024-07-14 LAB — SYNOVIAL FLUID ANALYSIS, COMPLETE
Basophils, %: 0 %
Eosinophils-Synovial: 0 % (ref 0–2)
Lymphocytes-Synovial Fld: 12 % (ref 0–74)
Monocyte/Macrophage: 36 % (ref 0–69)
Neutrophil, Synovial: 52 % — ABNORMAL HIGH (ref 0–24)
Synoviocytes, %: 0 % (ref 0–15)
WBC, Synovial: 1699 {cells}/uL — ABNORMAL HIGH (ref <150)

## 2024-07-14 MED ORDER — LIDOCAINE HCL 1 % IJ SOLN
3.0000 mL | INTRAMUSCULAR | Status: AC | PRN
  Administered 2024-07-14 (×2): 3 mL

## 2024-07-14 MED ORDER — PREDNISONE 5 MG PO TABS: ORAL_TABLET | ORAL | 0 refills | Status: AC

## 2024-07-14 MED ORDER — TRIAMCINOLONE ACETONIDE 40 MG/ML IJ SUSP
40.0000 mg | INTRAMUSCULAR | Status: AC | PRN
  Administered 2024-07-14 (×2): 40 mg via INTRA_ARTICULAR

## 2024-07-14 NOTE — Patient Instructions (Signed)
 Tocilizumab Injection What is this medication? TOCILIZUMAB (TOE si LIZ ue mab) treats autoimmune conditions, such as arthritis. It works by slowing down an overactive immune system. It may also be used to treat severe COVID-19 in people who are hospitalized. It is a monoclonal antibody. This medicine may be used for other purposes; ask your health care provider or pharmacist if you have questions. COMMON BRAND NAME(S): Actemra, TOFIDENCE, Tyenne What should I tell my care team before I take this medication? They need to know if you have any of these conditions: Cancer Diabetes Heart disease History of or current hepatitis B infection High blood pressure High cholesterol Immune system problems Infection Liver disease Low blood counts, such as low white cell, platelet, or red cell counts Multiple sclerosis Recent or upcoming vaccine Stomach or intestine problems Stroke An unusual or allergic reaction to tocilizumab, other medications, foods, dyes, or preservatives Pregnant or trying to get pregnant Breast-feeding How should I use this medication? This medication is injected into a vein or under the skin. It may be given by your care team in a hospital or clinic setting. It may also be given at home. If you get this medication at home, you will be taught how to prepare and give it. Use it exactly as directed. Take it as directed on the prescription label. Keep taking it unless your care team tells you to stop. If you use a pen, be sure to take off the outer needle cover before using the dose. It is important that you put your used needles and syringes in a special sharps container. Do not put them in a trash can. If you do not have a sharps container, call your pharmacist or care team to get one. A special MedGuide will be given to you by the pharmacist with each prescription and refill. Be sure to read this information carefully each time. Talk to your care team about the use of this  medication in children. While it may be prescribed for children as young as 2 years for selected conditions, precautions do apply. Overdosage: If you think you have taken too much of this medicine contact a poison control center or emergency room at once. NOTE: This medicine is only for you. Do not share this medicine with others. What if I miss a dose? If you get this medication at the hospital or clinic: It is important not to miss your dose. Call your care team if you are unable to keep an appointment. If you give yourself this medication at home: If you miss a dose, take it as soon as you can. If it is almost time for your next dose, take only that dose. Do not take double or extra doses. Call your care team with questions. What may interact with this medication? Do not take this medication with any of the following: Live virus vaccines This medication may also interact with the following: Biologic medications, such as abatacept, adalimumab, anakinra, certolizumab, etanercept , golimumab, infliximab, rituximab, secukinumab, ustekinumab Certain medications for cholesterol, such as atorvastatin, lovastatin, simvastatin  Cyclosporine Estrogen or progestin hormones Omeprazole Steroid medications, such as prednisone  or cortisone Theophylline Vaccines Warfarin This list may not describe all possible interactions. Give your health care provider a list of all the medicines, herbs, non-prescription drugs, or dietary supplements you use. Also tell them if you smoke, drink alcohol, or use illegal drugs. Some items may interact with your medicine. What should I watch for while using this medication? Visit your care team for regular  checks on your progress. Your condition will be monitored carefully while you are receiving this medication. Tell your care team if your symptoms do not start to get better or if they get worse. You may need blood work done while taking this medication. You will be tested for  tuberculosis (TB) before you start this medication. If your care team prescribes any medication for TB, you should start taking the TB medication before starting this medication. Make sure to finish the full course of TB medication. This medication may increase your risk of getting an infection. Call your care team for advice if you get a fever, chills, sore throat, or other symptoms of a cold or flu. Do not treat yourself. Try to avoid being around people who are sick. Talk to your care team about your risk of cancer. You may be more at risk for certain types of cancers if you take this medication. What side effects may I notice from receiving this medication? Side effects that you should report to your care team as soon as possible: Allergic reactions--skin rash, itching, hives, swelling of the face, lips, tongue, or throat Infection--fever, chills, cough, sore throat, wounds that don't heal, pain or trouble when passing urine, general feeling of discomfort or being unwell Liver injury--right upper belly pain, loss of appetite, nausea, light-colored stool, dark yellow or brown urine, yellowing skin or eyes, unusual weakness or fatigue Rash, fever, and swollen lymph nodes Stomach pain that is severe, does not go away, or gets worse Unusual bruising or bleeding Side effects that usually do not require medical attention (report these to your care team if they continue or are bothersome): Dizziness Headache Pain, redness, or irritation at injection site Runny or stuffy nose Sore throat Stomach pain This list may not describe all possible side effects. Call your doctor for medical advice about side effects. You may report side effects to FDA at 1-800-FDA-1088. Where should I keep my medication? Keep out of the reach of children and pets. You will be instructed on how to store this medication. Get rid of any unused medication after the expiration date on the label. To get rid of medications that  are no longer needed or have expired: Take the medication to a medication take-back program. Check with your pharmacy or law enforcement to find a location. If you cannot return the medication, ask your pharmacist or care team how to get rid of this medication safely. NOTE: This sheet is a summary. It may not cover all possible information. If you have questions about this medicine, talk to your doctor, pharmacist, or health care provider.  2025 Elsevier/Gold Standard (2023-11-06 00:00:00)

## 2024-07-14 NOTE — Progress Notes (Signed)
 Pharmacy Note Subjective: Patient presents today to the [facility] for follow up office visit. Patient seen by the pharmacist for counseling on Actemra for rheumatoid arthritis.  History of hyperlipidemia: Yes  History of diverticulitis: No  He did not go to his Simponi  Aria infusion that was due on 07/13/2024/\  Objective: CBC    Component Value Date/Time   WBC 6.5 04/13/2024 1317   RBC 4.81 04/13/2024 1317   HGB 15.1 04/13/2024 1317   HCT 43.7 04/13/2024 1317   HCT 25.1 (L) 08/22/2018 0750   PLT 218 04/13/2024 1317   MCV 90.9 04/13/2024 1317   MCH 31.4 04/13/2024 1317   MCHC 34.6 04/13/2024 1317   RDW 14.4 04/13/2024 1317   LYMPHSABS 2,223 06/17/2023 1327   MONOABS 0.2 08/17/2018 1527   EOSABS 91 04/13/2024 1317   BASOSABS 33 04/13/2024 1317    CMP     Component Value Date/Time   NA 136 04/13/2024 1317   K 4.2 04/13/2024 1317   CL 99 04/13/2024 1317   CO2 33 (H) 04/13/2024 1317   GLUCOSE 113 (H) 04/13/2024 1317   BUN 11 04/13/2024 1317   CREATININE 0.85 04/13/2024 1317   CALCIUM  8.9 04/13/2024 1317   PROT 8.2 (H) 04/13/2024 1317   ALBUMIN 2.2 (L) 08/17/2018 1527   AST 15 04/13/2024 1317   ALT 20 04/13/2024 1317   ALKPHOS 168 (H) 08/17/2018 1527   BILITOT 0.5 04/13/2024 1317   GFRNONAA 112 12/28/2018 1510   GFRAA 129 12/28/2018 1510     Baseline Immunosuppressant Therapy Labs     Latest Ref Rng & Units 11/18/2023    1:58 PM  Quantiferon TB Gold  Quantiferon TB Gold Plus NEGATIVE NEGATIVE        Latest Ref Rng & Units 05/27/2024    4:37 PM  Hepatitis  Hep B Surface Ag NON-REACTIVE NON-REACTIVE     Lab Results  Component Value Date   HIV REPEATEDLY REACTIVE (A) 12/28/2018   HIV Reactive (A) 08/17/2018       Latest Ref Rng & Units 07/30/2021    8:56 AM  Immunoglobulin Electrophoresis  IgA  47 - 310 mg/dL 709   IgG 399 - 8,359 mg/dL 7,618   IgM 50 - 699 mg/dL 885        Latest Ref Rng & Units 04/13/2024    1:17 PM  Serum  Protein Electrophoresis  Total Protein 6.1 - 8.1 g/dL 8.2     Lab Results  Component Value Date   G6PDH 18.5 07/30/2021    Lab Results  Component Value Date   TPMT 18 07/30/2021     Lipid Panel Lab Results  Component Value Date   CHOL 131 04/13/2024   HDL 24 (L) 04/13/2024   LDLCALC 65 04/13/2024   TRIG 350 (H) 04/13/2024   CHOLHDL 5.5 (H) 04/13/2024     Chest x-ray: 08/19/2018  Assessment/Plan:  Counseled patient that Actemra is an IL-6 blocking agent.  Counseled patient on purpose, proper use, and adverse effects of Actemra.  Reviewed the most common adverse effects including infections, infusion reactions, bowel injury, and rarely cancer and conditions of the nervous system.  Reviewed risk of lipid abnormalities and elevated liver enzymes. Discussed that there is the possibility of an increased risk of malignancy but it is not well understood if this increased risk is due to the medication or the disease state. Counseled patient that Actemra should be held prior to scheduled surgery for infections or new antibiotic use. Counseled patient  that Actemra should be held prior to scheduled surgery. Recommend annual influenza, PCV 15 or PCV20 or Pneumovax 23, and Shingrix as indicated.  Reviewed the importance of regular labs while on Actemra therapy including the need for routine lipid panel.  Will recheck lipid panel after 4-8 weeks of starting Actemra, then every 6 months routinely. CBC and CMP will be monitored every 3 months. TB gold will be monitored annually. Standing orders placed.  Provided patient with medication education material and answered all questions.  Patient voiced understanding.  Patient consented to Actemra.  Will upload consent into the media tab.  Reviewed storage instructions of Actemra with patient.  Advised patient that initial Actemra injection must be given in the office.  Will apply for Actemra through patient's insurance for self-administration using  auto-injector/prefilled syringe.  Patient dose will be  162 mg every 14 days based on weight <100 kg .  Prescription pending lab results and/or insurance approval.  He prefers PEN INJECTOR  Infusion orders for Simponi  Aria discontinued. He is able to start Actemra as soon as approved (he did not go to his infusion that was due on 07/13/2024)  He will need ID clearance to start treatment  Sherry Pennant, PharmD, MPH, BCPS, CPP Clinical Pharmacist (Rheumatology and Pulmonology)

## 2024-07-14 NOTE — Progress Notes (Signed)
 Synovial analysis consistent with inflammation.  No crystals present.

## 2024-07-14 NOTE — Progress Notes (Signed)
 Pharmacy Note Subjective: Patient presents today to the [facility] for follow up office visit. Patient seen by the pharmacist for counseling on Actemra  for rheumatoid arthritis.  History of hyperlipidemia: Yes  History of diverticulitis: No  He did not go to his Simponi  Aria infusion that was due on 07/13/2024/\  Objective: CBC    Component Value Date/Time   WBC 6.5 04/13/2024 1317   RBC 4.81 04/13/2024 1317   HGB 15.1 04/13/2024 1317   HCT 43.7 04/13/2024 1317   HCT 25.1 (L) 08/22/2018 0750   PLT 218 04/13/2024 1317   MCV 90.9 04/13/2024 1317   MCH 31.4 04/13/2024 1317   MCHC 34.6 04/13/2024 1317   RDW 14.4 04/13/2024 1317   LYMPHSABS 2,223 06/17/2023 1327   MONOABS 0.2 08/17/2018 1527   EOSABS 91 04/13/2024 1317   BASOSABS 33 04/13/2024 1317    CMP     Component Value Date/Time   NA 136 04/13/2024 1317   K 4.2 04/13/2024 1317   CL 99 04/13/2024 1317   CO2 33 (H) 04/13/2024 1317   GLUCOSE 113 (H) 04/13/2024 1317   BUN 11 04/13/2024 1317   CREATININE 0.85 04/13/2024 1317   CALCIUM  8.9 04/13/2024 1317   PROT 8.2 (H) 04/13/2024 1317   ALBUMIN 2.2 (L) 08/17/2018 1527   AST 15 04/13/2024 1317   ALT 20 04/13/2024 1317   ALKPHOS 168 (H) 08/17/2018 1527   BILITOT 0.5 04/13/2024 1317   GFRNONAA 112 12/28/2018 1510   GFRAA 129 12/28/2018 1510     Baseline Immunosuppressant Therapy Labs     Latest Ref Rng & Units 11/18/2023    1:58 PM  Quantiferon TB Gold  Quantiferon TB Gold Plus NEGATIVE NEGATIVE        Latest Ref Rng & Units 05/27/2024    4:37 PM  Hepatitis  Hep B Surface Ag NON-REACTIVE NON-REACTIVE     Lab Results  Component Value Date   HIV REPEATEDLY REACTIVE (A) 12/28/2018   HIV Reactive (A) 08/17/2018       Latest Ref Rng & Units 07/30/2021    8:56 AM  Immunoglobulin Electrophoresis  IgA  47 - 310 mg/dL 709   IgG 399 - 8,359 mg/dL 7,618   IgM 50 - 699 mg/dL 885        Latest Ref Rng & Units 04/13/2024    1:17 PM  Serum  Protein Electrophoresis  Total Protein 6.1 - 8.1 g/dL 8.2     Lab Results  Component Value Date   G6PDH 18.5 07/30/2021    Lab Results  Component Value Date   TPMT 18 07/30/2021     Lipid Panel Lab Results  Component Value Date   CHOL 131 04/13/2024   HDL 24 (L) 04/13/2024   LDLCALC 65 04/13/2024   TRIG 350 (H) 04/13/2024   CHOLHDL 5.5 (H) 04/13/2024     Chest x-ray: 08/19/2018  Assessment/Plan:  Counseled patient that Actemra  is an IL-6 blocking agent.  Counseled patient on purpose, proper use, and adverse effects of Actemra .  Reviewed the most common adverse effects including infections, infusion reactions, bowel injury, and rarely cancer and conditions of the nervous system.  Reviewed risk of lipid abnormalities and elevated liver enzymes. Discussed that there is the possibility of an increased risk of malignancy but it is not well understood if this increased risk is due to the medication or the disease state. Counseled patient that Actemra  should be held prior to scheduled surgery for infections or new antibiotic use. Counseled patient  that Actemra  should be held prior to scheduled surgery. Recommend annual influenza, PCV 15 or PCV20 or Pneumovax 23, and Shingrix as indicated.  Reviewed the importance of regular labs while on Actemra  therapy including the need for routine lipid panel.  Will recheck lipid panel after 4-8 weeks of starting Actemra , then every 6 months routinely. CBC and CMP will be monitored every 3 months. TB gold will be monitored annually. Standing orders placed.  Provided patient with medication education material and answered all questions.  Patient voiced understanding.  Patient consented to Actemra .  Will upload consent into the media tab.  Reviewed storage instructions of Actemra  with patient.  Advised patient that initial Actemra  injection must be given in the office.  Will apply for Actemra  through patient's insurance for self-administration using  auto-injector/prefilled syringe.  Patient dose will be  162 mg every 14 days based on weight <100 kg .  Prescription pending lab results and/or insurance approval.  He prefers PEN INJECTOR  Infusion orders for Simponi  Aria discontinued. He is able to start Actemra  as soon as approved (he did not go to his infusion that was due on 07/13/2024)  He will need ID clearance to start treatment  Sherry Pennant, PharmD, MPH, BCPS, CPP Clinical Pharmacist (Rheumatology and Pulmonology)

## 2024-07-14 NOTE — Addendum Note (Signed)
 Addended by: CENA ALFONSO CROME on: 07/14/2024 12:21 PM   Modules accepted: Orders

## 2024-07-15 ENCOUNTER — Telehealth: Payer: Self-pay

## 2024-07-15 DIAGNOSIS — M059 Rheumatoid arthritis with rheumatoid factor, unspecified: Secondary | ICD-10-CM

## 2024-07-15 DIAGNOSIS — Z79899 Other long term (current) drug therapy: Secondary | ICD-10-CM

## 2024-07-15 NOTE — Telephone Encounter (Signed)
 Actemra  denied. Tyenne  preferred  Submitted a Prior Authorization request to [facility] for TYENNE  SQ via CoverMyMeds. Will update once we receive a response.  Key: [id number]   Sherry Pennant, PharmD, MPH, BCPS, CPP Clinical Pharmacist (Rheumatology and Pulmonology)

## 2024-07-15 NOTE — Progress Notes (Signed)
 Hemoglobin is borderline low. Rest of CBC WNL.  Glucose is 138.  Rest of CMP WNL

## 2024-07-15 NOTE — Telephone Encounter (Addendum)
 Submitted a Prior Authorization request to [facility] for ACTEMRA  SQ via CoverMyMeds. Will update once we receive a response.  Key: [id number]   ----- Message from Sherry GORMAN Pennant sent at 07/14/2024 12:08 PM EDT ----- He will need ID clearance to start treatment ----- Message ----- From: Pennant Sherry GORMAN, RPH-CPP Sent: 07/14/2024  11:55 AM EDT To: Rx Rheum/Pulm  Please start Actemra  BIV Dose: 162mg  subcut every 2 weeks

## 2024-07-16 ENCOUNTER — Other Ambulatory Visit (HOSPITAL_COMMUNITY): Payer: Self-pay

## 2024-07-16 NOTE — Telephone Encounter (Signed)
 Received notification from [facility] regarding a prior authorization for TYENNE  SQ. Authorization has been APPROVED from 07/15/2024 to 01/15/2025. Approval letter sent to scan center.  Per test claim, copay for 28 days supply is $0  Patient can fill through [facility]: [phone]   Authorization # [id number]  New start pending clearance from infectious disease  Sherry Pennant, PharmD, MPH, BCPS, CPP Clinical Pharmacist (Rheumatology and Pulmonology)

## 2024-07-19 ENCOUNTER — Other Ambulatory Visit: Payer: Self-pay

## 2024-07-19 MED ORDER — TYENNE 162 MG/0.9ML ~~LOC~~ SOAJ
162.0000 mg | SUBCUTANEOUS | 0 refills | Status: DC
  Filled 2024-07-19: qty 1.8, 28d supply, fill #0

## 2024-07-19 NOTE — Progress Notes (Signed)
 Specialty Pharmacy Initial Fill Coordination Note  Patrick Huber is a 53 y.o. male contacted today regarding initial fill of specialty medication(s) Tocilizumab -aazg (Tyenne )   Patient requested Courier to Provider Office   Delivery date: 07/21/24   Verified address: [address] [place], [place] [address]   Medication will be filled on 8/19.   Patient is aware of $0 copayment.

## 2024-07-19 NOTE — Telephone Encounter (Signed)
 Clearance from Dr. Overton (infection diseases) received. Patient . Patient scheduled for Tyenne  new start on 07/26/2024. He is aware that [facility] will be reaching out to schedule shipment to clinic  Sherry Pennant, PharmD, MPH, BCPS, CPP Clinical Pharmacist (Rheumatology and Pulmonology)

## 2024-07-20 ENCOUNTER — Other Ambulatory Visit: Payer: Self-pay

## 2024-07-23 NOTE — Patient Instructions (Signed)
 Your next ACTEMRA  SQ dose is due on 08/09/2024, 08/23/2024, and every 14 days thereafter  Complete prednisone  taper as prescribed  HOLD ACTEMRA  SQ if you have signs or symptoms of an infection. You can resume once you feel better or back to your baseline. HOLD ACTEMRA  SQ if you start antibiotics to treat an infection. HOLD ACTEMRA  SQ around the time of surgery/procedures. Your surgeon will be able to provide recommendations on when to hold BEFORE and when you are cleared to RESUME.  Pharmacy information: Your prescription will be shipped from [facility]. Their phone number is [phone] They will call to schedule shipment and confirm address. They will mail your medication to your home.  Labs are due in 1 month then every 3 months. Lab hours are from Monday to Thursday 8am-12:30pm and 1pm-4pm and Friday 8am-12pm. You do not need an appointment if you come for labs during these times. If you'd like to go to a [facility] or [facility] closer to home, please call our clinic 48 hours prior to lab date so we can release orders in a timely manner.  Stay up to date on all routine vaccines: influenza, pneumonia, COVID19, Shingles  How to manage an injection site reaction: Remember the 5 C's: COUNTER - leave on the counter at least 30 minutes but up to overnight to bring medication to room temperature. This may help prevent stinging COLD - place something cold (like an ice gel pack or cold water bottle) on the injection site just before cleansing with alcohol. This may help reduce pain CLARITIN - use Claritin (generic name is loratadine) for the first two weeks of treatment or the day of, the day before, and the day after injecting. This will help to minimize injection site reactions CORTISONE CREAM - apply if injection site is irritated and itching CALL ME - if injection site reaction is bigger than the size of your fist, looks infected, blisters, or if you develop hives

## 2024-07-23 NOTE — Progress Notes (Signed)
 Pharmacy Note  Subjective:   Patient presents to clinic today to receive first dose of TYENNE  SQ for seropositive RA. Unable to tolerate hydroxychloroquine  due to GI side effects. He is switching from Simponi  Aria infusions (reports doing well for some time when he was completing the loading dose but now reports recurrence of pain as soon as 3.5 weeks after infusion).  Patient running a fever or have signs/symptoms of infection? No  Patient currently on antibiotics for the treatment of infection? No  Patient have any upcoming invasive procedures/surgeries? No  Objective: CMP     Component Value Date/Time   NA 138 07/14/2024 1200   K 3.5 07/14/2024 1200   CL 99 07/14/2024 1200   CO2 33 (H) 07/14/2024 1200   GLUCOSE 138 (H) 07/14/2024 1200   BUN 9 07/14/2024 1200   CREATININE 0.83 07/14/2024 1200   CALCIUM  8.7 07/14/2024 1200   PROT 7.6 07/14/2024 1200   ALBUMIN 2.2 (L) 08/17/2018 1527   AST 13 07/14/2024 1200   ALT 16 07/14/2024 1200   ALKPHOS 168 (H) 08/17/2018 1527   BILITOT 0.4 07/14/2024 1200   GFRNONAA 112 12/28/2018 1510   GFRAA 129 12/28/2018 1510    CBC    Component Value Date/Time   WBC 4.1 07/14/2024 1200   RBC 4.26 07/14/2024 1200   HGB 12.9 (L) 07/14/2024 1200   HCT 39.9 07/14/2024 1200   HCT 25.1 (L) 08/22/2018 0750   PLT 200 07/14/2024 1200   MCV 93.7 07/14/2024 1200   MCH 30.3 07/14/2024 1200   MCHC 32.3 07/14/2024 1200   RDW 14.6 07/14/2024 1200   LYMPHSABS 2,223 06/17/2023 1327   MONOABS 0.2 08/17/2018 1527   EOSABS 70 07/14/2024 1200   BASOSABS 21 07/14/2024 1200    Baseline Immunosuppressant Therapy Labs TB GOLD    Latest Ref Rng & Units 11/18/2023    1:58 PM  Quantiferon TB Gold  Quantiferon TB Gold Plus NEGATIVE NEGATIVE    Hepatitis Panel    Latest Ref Rng & Units 05/27/2024    4:37 PM  Hepatitis  Hep B Surface Ag NON-REACTIVE NON-REACTIVE    HIV Lab Results  Component Value Date   HIV REPEATEDLY REACTIVE (A) 12/28/2018   HIV  Reactive (A) 08/17/2018   Immunoglobulins    Latest Ref Rng & Units 07/30/2021    8:56 AM  Immunoglobulin Electrophoresis  IgA  47 - 310 mg/dL 709   IgG 399 - 8,359 mg/dL 7,618   IgM 50 - 699 mg/dL 885    SPEP    Latest Ref Rng & Units 07/14/2024   12:00 PM  Serum Protein Electrophoresis  Total Protein 6.1 - 8.1 g/dL 7.6    H3EI Lab Results  Component Value Date   G6PDH 18.5 07/30/2021   TPMT Lab Results  Component Value Date   TPMT 18 07/30/2021    Chest x-ray: 08/19/2018  Assessment/Plan:  Reviewed importance of holding TYENNE  SQ with signs/symptoms of an infections, if antibiotics are prescribed to treat an active infection, and with invasive procedures  Demonstrated proper injection technique with Tyenne  demo device  Patient able to demonstrate proper injection technique using the teach back method.  Patient self injected in the right lower abdomen with:  Pharmacy-supplied Medication: Tyenne  162mg /0.62mL pen injector NDC: [phone] Lot: [id number] Expiration: 09/2026  Patient tolerated well.  Observed for 30 mins in office for adverse reaction. Patient denies itchiness and irritation at injection., No swelling or redness noted., and Reviewed injection site reaction management with patient verbally  and printed information for review in AVS  Patient is to return in 1 month for labs and 6-8 weeks for follow-up appointment.  Standing orders for CBC/CMP placed.  TB gold will be monitored yearly. Lipid panel will be monitored at 4-8 weeks after therapy initiation then every 6 months.  TYENNE  SQ approved through insurance .   Rx sent to: [facility]: [phone] .  Patient provided with pharmacy phone number and advised that they will call to schedule shipment to home.  Patient will continue TYENNE  SQ 162mg  subcut every 14 days. He will complete prednisone  taper as prescribed.  All questions encouraged and answered.  Instructed patient to call with any  further questions or concerns.  Sherry Pennant, PharmD, MPH, BCPS, CPP Clinical Pharmacist (Rheumatology and Pulmonology)  07/23/2024 11:24 AM

## 2024-07-26 ENCOUNTER — Other Ambulatory Visit: Payer: Self-pay

## 2024-07-26 ENCOUNTER — Ambulatory Visit: Attending: Rheumatology | Admitting: Pharmacist

## 2024-07-26 DIAGNOSIS — Z7189 Other specified counseling: Secondary | ICD-10-CM

## 2024-07-26 DIAGNOSIS — Z79899 Other long term (current) drug therapy: Secondary | ICD-10-CM

## 2024-07-26 DIAGNOSIS — M059 Rheumatoid arthritis with rheumatoid factor, unspecified: Secondary | ICD-10-CM

## 2024-07-26 MED ORDER — TYENNE 162 MG/0.9ML ~~LOC~~ SOAJ
162.0000 mg | SUBCUTANEOUS | 1 refills | Status: DC
  Filled 2024-07-26 – 2024-08-12 (×2): qty 1.8, 28d supply, fill #0
  Filled 2024-09-09: qty 1.8, 28d supply, fill #1

## 2024-08-11 ENCOUNTER — Other Ambulatory Visit: Payer: Self-pay | Admitting: Internal Medicine

## 2024-08-11 DIAGNOSIS — G629 Polyneuropathy, unspecified: Secondary | ICD-10-CM

## 2024-08-11 NOTE — Telephone Encounter (Signed)
 Please advise, no pcp

## 2024-08-12 ENCOUNTER — Other Ambulatory Visit: Payer: Self-pay

## 2024-08-12 NOTE — Progress Notes (Signed)
 Specialty Pharmacy Refill Coordination Note  Patrick Huber is a 53 y.o. male contacted today regarding refills of specialty medication(s) Tocilizumab -aazg (Tyenne )   Patient requested Delivery   Delivery date: 08/18/24   Verified address: [address]   [place] [address]   Medication will be filled on 08/17/24.

## 2024-08-12 NOTE — Telephone Encounter (Signed)
 I am ok refilling

## 2024-08-17 ENCOUNTER — Other Ambulatory Visit: Payer: Self-pay

## 2024-08-25 NOTE — Progress Notes (Signed)
 Office Visit Note  Patient: Patrick Huber             Date of Birth: 1971-11-09           MRN: [id number]             PCP: Pcp, No Referring: No ref. provider found Visit Date: 09/08/2024 Occupation: Data Unavailable  Subjective:  Medication monitoring   History of Present Illness: Patrick Huber is a 53 y.o. male with history of seropositive rheumatoid arthritis.  Patient is currently on actemra  (tyenne )162 mg sq injections every 14 days. Paitent was started on tyenne  on 07/26/24.  He is tolerating tyenne  without any side effects or injection site reactions.  He has not missed any doses.  He is due for an injection today.  Patient states that he has noticed about a 60% improvement in his symptoms since switching from infusions to tyenne .  Patient states he continues to have some inflammation in the right ankle as well as discomfort in the left knee but overall everything else has improved.  He is willing to give tyenne  more time to take effect.  He denies any recent or recurrent infections.      Activities of Daily Living:  Patient reports morning stiffness for 1-1.5 hours.   Patient Reports nocturnal pain.  Difficulty dressing/grooming: Denies Difficulty climbing stairs: Reports Difficulty getting out of chair: Reports Difficulty using hands for taps, buttons, cutlery, and/or writing: Denies  Review of Systems  Constitutional:  Negative for fatigue.  HENT:  Negative for mouth sores and mouth dryness.   Eyes:  Negative for dryness.  Respiratory:  Negative for shortness of breath.   Cardiovascular:  Negative for chest pain and palpitations.  Gastrointestinal:  Negative for blood in stool, constipation and diarrhea.  Endocrine: Negative for increased urination.  Genitourinary:  Negative for involuntary urination.  Musculoskeletal:  Positive for joint pain, gait problem, joint pain, joint swelling, myalgias, morning stiffness and myalgias. Negative for muscle weakness and  muscle tenderness.  Skin:  Negative for color change, rash, hair loss and sensitivity to sunlight.  Allergic/Immunologic: Negative for susceptible to infections.  Neurological:  Negative for dizziness and headaches.  Hematological:  Negative for swollen glands.  Psychiatric/Behavioral:  Negative for depressed mood and sleep disturbance. The patient is not nervous/anxious.     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   Malnutrition of moderate degree 08/18/2018   HIV disease (HCC) 08/18/2018   Unintentional weight loss 08/18/2018   Rheumatoid arthritis (HCC) 08/18/2018   Knee mass, right 08/18/2018   Pancytopenia (HCC) 08/18/2018   Cigarette smoker 08/18/2018   GERD (gastroesophageal reflux disease) 08/18/2018   Psoriasis 08/18/2018   History of MRSA infection 08/18/2018   Neoplasm 06/21/2018   Mass 04/08/2018    Past Medical History:  Diagnosis Date   Angina pectoris    HIV (human immunodeficiency virus infection) (HCC)    Seropositive rheumatoid arthritis (HCC)     Family History  Problem Relation Age of Onset   Heart disease Mother    Cancer Mother    Cancer Father    Heart disease Father    Cancer Sister    Heart disease Brother    Healthy Son    Healthy Daughter    Past Surgical History:  Procedure Laterality Date   KNEE ARTHROSCOPY Left    Right knee surgery     Mass - at [facility]    Social History   Tobacco Use   Smoking status: Every Day    Current packs/day: 1.00    Average packs/day: 1 pack/day for 25.0 years (25.0 ttl pk-yrs)    Types: Cigarettes    Passive exposure: Never   Smokeless tobacco: Never  Vaping Use   Vaping status: Never Used  Substance Use Topics   Alcohol use: Not Currently   Drug use: Never   Social History   Social History Narrative   Not on file     Immunization History  Administered Date(s) Administered   Influenza,inj,Quad PF,6+ Mos 09/02/2019     Objective: Vital Signs: BP 112/69   Pulse 90   Temp 97.6 F (36.4 C)   Resp 16   Ht 6' 1 (1.854 m)   Wt 201 lb (91.2 kg)   BMI 26.52 kg/m    Physical Exam Vitals and nursing note reviewed.  Constitutional:      Appearance: He is well-developed.  HENT:     Head: Normocephalic and atraumatic.  Eyes:     Conjunctiva/sclera: Conjunctivae normal.     Pupils: Pupils are equal, round, and reactive to light.  Cardiovascular:     Rate and Rhythm: Normal rate and regular rhythm.     Heart sounds: Normal heart sounds.  Pulmonary:     Effort: Pulmonary effort is normal.     Breath sounds: Normal breath sounds.  Abdominal:     General: Bowel sounds are normal.     Palpations: Abdomen is soft.  Musculoskeletal:     Cervical back: Normal range of motion and neck supple.  Skin:    General: Skin is warm and dry.     Capillary Refill: Capillary refill takes less than 2 seconds.  Neurological:     Mental Status: He is alert and oriented to person, place, and time.  Psychiatric:        Behavior: Behavior normal.      Musculoskeletal Exam: C-spine has slightly limited range of motion without rotation.  Painful range of motion of both shoulders with limited abduction to about 120 degrees.  Flexion contractures of both elbows with some warmth and synovial thickening along the left elbow joint line.  Limited range of motion of both wrist joints.  No tenderness or synovitis over  MCP joints.  Some warmth of the left knee with no effusion noted.  Tenderness and swelling of the right ankle.  CDAI Exam: CDAI Score: -- Patient Global: --; Provider Global: -- Swollen: --; Tender: -- Joint Exam 09/08/2024   No joint exam has been documented for this visit   There is currently no information documented on the homunculus. Go to the Rheumatology activity and complete the homunculus joint exam.  Investigation: No additional findings.  Imaging: No results found.  Recent Labs: Lab Results  Component Value Date   WBC 4.1 07/14/2024   HGB 12.9 (L) 07/14/2024   PLT 200 07/14/2024   NA 138 07/14/2024   K 3.5 07/14/2024   CL 99 07/14/2024   CO2 33 (H)  07/14/2024   GLUCOSE 138 (H) 07/14/2024   BUN 9 07/14/2024   CREATININE 0.83 07/14/2024   BILITOT 0.4 07/14/2024   ALKPHOS 168 (H) 08/17/2018   AST 13 07/14/2024   ALT 16 07/14/2024   PROT 7.6 07/14/2024   ALBUMIN 2.2 (L) 08/17/2018   CALCIUM  8.7 07/14/2024   GFRAA 129 12/28/2018   QFTBGOLDPLUS NEGATIVE 11/18/2023    Speciality Comments: Enbrel  stopped 08/04/22 Humira  started 08/12/22 Simponi  Aria started 04/20/24 Hydroxychloroquine  d/c due to GI side effects Tyenne  started 07/26/24  Procedures:  No procedures performed Allergies: Meperidine and Meperidine hcl     Assessment / Plan:     Visit Diagnoses: Seropositive rheumatoid arthritis (HCC) - Anti-CCP>250, RF 20, ESR 72, Bilateral knee swelling, bilateral elbow joint contractures, +synovitis: He has noticed about a 60% improvement in his symptoms since initiating Tyenne  on 07/26/24.  He is tolerating tyenne  without any side effects or injection site reactions.  He has not had any recent or recurrent infections.  His mobility has improved he continues to have swelling in his right ankle but the effusion in the left knee has resolved.  Warmth of the left elbow was noted.  He is willing to give tyenne  more time and for us  to reassess for the full efficacy.  He  will follow up in 3 months or sooner if needed.   High risk medication use - Tyenne  162 mg sq injections every 14 days-started 07/26/24 Previous therapy Enbrel , Humira , Simponi  Aria, Plaquenil  . - Plan: CBC with Differential/Platelet, Comprehensive metabolic panel with GFR CBC and CMP updated on 07/14/24.  Orders for CBC and CMP were released today.  His next Abrol be due in January and every 3 months to monitor for drug toxicity. Lipid panel updated on 07/14/24.  TB gold negative on 11/18/23.  Psoriasis: Not active psoriasis at this time.   Primary osteoarthritis of both hands: Some limitation with range of motion of both wrist joints but no active synovitis was noted.  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  Warmth of the right elbow noted. He will remain on Tyenne  as prescribed.   Primary osteoarthritis of both knees: Patient continues to have chronic pain in the left knee joint.  The moderate to large effusion in the left knee at his last office visit has resolved since having a cortisone injection performed on 07/14/2024.  He will remain on Tyenne  as prescribed.   Effusion, left knee - S/P Left knee aspiration and injection on 07/14/2024. 31 L of clear fluid was aspirated.  A cortisone injection was performed and a prednisone  taper was prescribed.  He continues to have chronic pain in the left knee but the inflammation has subsided.   Primary osteoarthritis of both feet: Chronic pain and swelling of the right ankle.   Other medical conditions are listed as follows:  Cigarette smoker  HIV disease (HCC)  Late latent syphilis  Pancytopenia (HCC)  History of MRSA infection  Neuropathy  Orders: Orders Placed This Encounter  Procedures   CBC with Differential/Platelet   Comprehensive metabolic panel with GFR   No orders of the defined types were placed in this encounter.  Follow-Up Instructions: Return in about 3  months (around 12/09/2024) for Rheumatoid arthritis.   Waddell CHRISTELLA Craze, PA-C  Note - This record has been created using Dragon software.  Chart creation errors have been sought, but may not always  have been located. Such creation errors do not reflect  on  the standard of medical care.

## 2024-09-08 ENCOUNTER — Encounter: Payer: Self-pay | Admitting: Physician Assistant

## 2024-09-08 ENCOUNTER — Ambulatory Visit: Attending: Physician Assistant | Admitting: Physician Assistant

## 2024-09-08 VITALS — BP 112/69 | HR 90 | Temp 97.6°F | Resp 16 | Ht 73.0 in | Wt 201.0 lb

## 2024-09-08 DIAGNOSIS — B20 Human immunodeficiency virus [HIV] disease: Secondary | ICD-10-CM

## 2024-09-08 DIAGNOSIS — L409 Psoriasis, unspecified: Secondary | ICD-10-CM | POA: Diagnosis not present

## 2024-09-08 DIAGNOSIS — Z79899 Other long term (current) drug therapy: Secondary | ICD-10-CM

## 2024-09-08 DIAGNOSIS — M19042 Primary osteoarthritis, left hand: Secondary | ICD-10-CM

## 2024-09-08 DIAGNOSIS — M24522 Contracture, left elbow: Secondary | ICD-10-CM

## 2024-09-08 DIAGNOSIS — D61818 Other pancytopenia: Secondary | ICD-10-CM

## 2024-09-08 DIAGNOSIS — G629 Polyneuropathy, unspecified: Secondary | ICD-10-CM

## 2024-09-08 DIAGNOSIS — Z8614 Personal history of Methicillin resistant Staphylococcus aureus infection: Secondary | ICD-10-CM

## 2024-09-08 DIAGNOSIS — M24521 Contracture, right elbow: Secondary | ICD-10-CM

## 2024-09-08 DIAGNOSIS — M059 Rheumatoid arthritis with rheumatoid factor, unspecified: Secondary | ICD-10-CM

## 2024-09-08 DIAGNOSIS — M19071 Primary osteoarthritis, right ankle and foot: Secondary | ICD-10-CM

## 2024-09-08 DIAGNOSIS — A528 Late syphilis, latent: Secondary | ICD-10-CM

## 2024-09-08 DIAGNOSIS — M19072 Primary osteoarthritis, left ankle and foot: Secondary | ICD-10-CM

## 2024-09-08 DIAGNOSIS — F17210 Nicotine dependence, cigarettes, uncomplicated: Secondary | ICD-10-CM

## 2024-09-08 DIAGNOSIS — M19041 Primary osteoarthritis, right hand: Secondary | ICD-10-CM

## 2024-09-08 DIAGNOSIS — M170 Bilateral primary osteoarthritis of knee: Secondary | ICD-10-CM

## 2024-09-08 DIAGNOSIS — M25462 Effusion, left knee: Secondary | ICD-10-CM

## 2024-09-08 LAB — COMPREHENSIVE METABOLIC PANEL WITH GFR
AG Ratio: 1.2 (calc) (ref 1.0–2.5)
ALT: 18 U/L (ref 9–46)
AST: 13 U/L (ref 10–35)
Albumin: 4 g/dL (ref 3.6–5.1)
Alkaline phosphatase (APISO): 81 U/L (ref 35–144)
BUN: 12 mg/dL (ref 7–25)
CO2: 32 mmol/L (ref 20–32)
Calcium: 8.8 mg/dL (ref 8.6–10.3)
Chloride: 100 mmol/L (ref 98–110)
Creat: 0.85 mg/dL (ref 0.70–1.30)
Globulin: 3.3 g/dL (ref 1.9–3.7)
Glucose, Bld: 190 mg/dL — ABNORMAL HIGH (ref 65–99)
Potassium: 3.8 mmol/L (ref 3.5–5.3)
Sodium: 139 mmol/L (ref 135–146)
Total Bilirubin: 0.4 mg/dL (ref 0.2–1.2)
Total Protein: 7.3 g/dL (ref 6.1–8.1)
eGFR: 105 mL/min/1.73m2 (ref >=60)

## 2024-09-08 LAB — CBC WITH DIFFERENTIAL/PLATELET
Absolute Lymphocytes: 1458 {cells}/uL (ref 850–3900)
Absolute Monocytes: 159 {cells}/uL — ABNORMAL LOW (ref 200–950)
Basophils Absolute: 22 {cells}/uL (ref 0–200)
Basophils Relative: 0.5 %
Eosinophils Absolute: 52 {cells}/uL (ref 15–500)
Eosinophils Relative: 1.2 %
HCT: 40.7 % (ref 38.5–50.0)
Hemoglobin: 13.4 g/dL (ref 13.2–17.1)
MCH: 30.7 pg (ref 27.0–33.0)
MCHC: 32.9 g/dL (ref 32.0–36.0)
MCV: 93.1 fL (ref 80.0–100.0)
MPV: 9.4 fL (ref 7.5–12.5)
Monocytes Relative: 3.7 %
Neutro Abs: 2610 {cells}/uL (ref 1500–7800)
Neutrophils Relative %: 60.7 %
Platelets: 172 Thousand/uL (ref 140–400)
RBC: 4.37 Million/uL (ref 4.20–5.80)
RDW: 15.6 % — ABNORMAL HIGH (ref 11.0–15.0)
Total Lymphocyte: 33.9 %
WBC: 4.3 Thousand/uL (ref 3.8–10.8)

## 2024-09-09 ENCOUNTER — Other Ambulatory Visit: Payer: Self-pay

## 2024-09-09 ENCOUNTER — Ambulatory Visit: Payer: Self-pay | Admitting: Physician Assistant

## 2024-09-09 NOTE — Progress Notes (Signed)
 Specialty Pharmacy Refill Coordination Note  Patrick Huber is a 53 y.o. male contacted today regarding refills of specialty medication(s) Tocilizumab -aazg (Tyenne )   Patient requested Delivery   Delivery date: 09/17/24   Verified address: [address]   [place] [address]   Medication will be filled on 10.16.25.

## 2024-09-09 NOTE — Progress Notes (Signed)
 Patient started Tyenne  in office on 07/26/2024 for seropositive RA.  Unable to tolerate hydroxychloroquine  due to GI side effects. He is switching from Simponi  Aria infusions (reports doing well for some time when he was completing the loading dose but now reports recurrence of pain as soon as 3.5 weeks after infusion).    Patient will continue TYENNE  SQ 162mg  subcut every 14 days. He will complete prednisone  taper as prescribed.  Sherry Pennant, PharmD, MPH, BCPS, CPP Clinical Pharmacist [facility])

## 2024-09-09 NOTE — Progress Notes (Signed)
 Glucose is 190, rest of CMP WNL.   Absolute monocytes are low, rest of CBC stable. We will continue to monitor lab work closely.

## 2024-09-16 ENCOUNTER — Other Ambulatory Visit: Payer: Self-pay

## 2024-09-24 ENCOUNTER — Other Ambulatory Visit: Payer: Self-pay

## 2024-09-24 NOTE — Telephone Encounter (Signed)
 Patient called the office asking for a refill on the Diclofenac  ,stated he was trying to do without it but he can't make it he is in lots of pain an nothing is helping.   Last Fill: 04/12/2024  Labs: 09/08/2024 Glucose is 190, rest of CMP WNL.   Absolute monocytes are low, rest of CBC stable. We will continue to monitor lab work closely.   Next Visit: 12/14/2024  Last Visit: 09/08/2024  DX: Seropositive rheumatoid arthritis   Current Dose per office note 09/08/2024: Dose not mentioned  Okay to refill Diclofenac ?

## 2024-09-27 MED ORDER — DICLOFENAC SODIUM 50 MG PO TBEC: 50.0000 mg | DELAYED_RELEASE_TABLET | Freq: Two times a day (BID) | ORAL | 2 refills | Status: DC

## 2024-10-12 ENCOUNTER — Other Ambulatory Visit: Payer: Self-pay | Admitting: Physician Assistant

## 2024-10-12 ENCOUNTER — Other Ambulatory Visit: Payer: Self-pay

## 2024-10-12 DIAGNOSIS — M059 Rheumatoid arthritis with rheumatoid factor, unspecified: Secondary | ICD-10-CM

## 2024-10-12 DIAGNOSIS — Z79899 Other long term (current) drug therapy: Secondary | ICD-10-CM

## 2024-10-12 MED ORDER — TYENNE 162 MG/0.9ML ~~LOC~~ SOAJ
162.0000 mg | SUBCUTANEOUS | 2 refills | Status: AC
  Filled 2024-10-12: qty 1.8, 28d supply, fill #0
  Filled 2024-11-09: qty 1.8, 28d supply, fill #1
  Filled 2024-12-01: qty 1.8, 28d supply, fill #2

## 2024-10-12 NOTE — Telephone Encounter (Signed)
 Last Fill: 07/26/2024  Labs: 09/08/2024 Glucose is 190, rest of CMP WNL.   Absolute monocytes are low, rest of CBC stable.  TB Gold: 11/18/2023 Neg    Next Visit: 12/14/2024  Last Visit: 09/08/2024  IK:Dzmnendpupcz rheumatoid arthritis   Current Dose per office note 09/08/2024: Tyenne  162 mg sq injections every 14 days   Okay to refill Tyenne ?

## 2024-10-14 ENCOUNTER — Other Ambulatory Visit: Payer: Self-pay

## 2024-10-14 NOTE — Progress Notes (Signed)
 Specialty Pharmacy Refill Coordination Note  Patrick Huber is a 53 y.o. male contacted today regarding refills of specialty medication(s) Tocilizumab -aazg (Tyenne )   Patient requested Delivery   Delivery date: 10/15/24   Verified address: [address]   [place] [address]   Medication will be filled on: 10/14/24

## 2024-11-03 ENCOUNTER — Other Ambulatory Visit: Payer: Self-pay

## 2024-11-04 ENCOUNTER — Other Ambulatory Visit: Payer: Self-pay

## 2024-11-09 ENCOUNTER — Other Ambulatory Visit: Payer: Self-pay

## 2024-11-09 ENCOUNTER — Other Ambulatory Visit (HOSPITAL_COMMUNITY): Payer: Self-pay

## 2024-11-11 ENCOUNTER — Other Ambulatory Visit: Payer: Self-pay

## 2024-11-11 NOTE — Progress Notes (Signed)
 Specialty Pharmacy Refill Coordination Note  Patrick Huber is a 53 y.o. male contacted today regarding refills of specialty medication(s) Tocilizumab -aazg (Tyenne )   Patient requested Delivery   Delivery date: 11/12/24   Verified address: [address]   [place] [address]   Medication will be filled on: 11/11/24

## 2024-11-30 NOTE — Progress Notes (Signed)
 "  Office Visit Note  Patient: Patrick Huber             Date of Birth: 12/08/1970           MRN: [id number]             PCP: Pcp, No Referring: No ref. provider found Visit Date: 12/14/2024 Occupation: Data Unavailable  Subjective:  Pain in multiple joints   History of Present Illness: Patrick Huber is a 53 y.o. male with history of rheumatoid arthritis.  Patient is currently on tyenne  162 mg sq injections every 14 days.  Patient has been tolerating tyenne  without any side effects or injection site reactions.  Patient is continuing to have pain and inflammation affecting the right ankle and both knee joints.  Patient states that he has difficulty ambulating due to severity of symptoms.  Patient states that his pain levels are inadequately controlled on the current treatment regimen.  He has continued to have pain and inflammation affecting the left elbow.  Patient states that he felt that his pain level was more manageable with the use of Humira .  He would like to discuss resuming Humira . He denies any response to the prednisone  taper prescribed at the last visit.     Activities of Daily Living:  Patient reports joint stiffness all day  Patient Reports nocturnal pain.  Difficulty dressing/grooming: Denies Difficulty climbing stairs: Reports Difficulty getting out of chair: Reports Difficulty using hands for taps, buttons, cutlery, and/or writing: Denies  Review of Systems  Constitutional:  Positive for fatigue. Negative for night sweats.  HENT:  Negative for mouth sores, mouth dryness and nose dryness.   Eyes:  Negative for redness and dryness.  Respiratory:  Negative for shortness of breath and difficulty breathing.   Cardiovascular:  Negative for chest pain, palpitations, hypertension, irregular heartbeat and swelling in legs/feet.  Gastrointestinal:  Negative for constipation and diarrhea.  Endocrine: Negative for increased urination.  Genitourinary:  Negative for  urgency.  Musculoskeletal:  Positive for joint pain, joint pain, joint swelling, myalgias, morning stiffness and myalgias. Negative for muscle weakness and muscle tenderness.  Skin:  Negative for color change, rash, hair loss, nodules/bumps, skin tightness, ulcers and sensitivity to sunlight.  Allergic/Immunologic: Negative for susceptible to infections.  Neurological:  Negative for dizziness, fainting, memory loss, night sweats and weakness.  Hematological:  Negative for swollen glands.  Psychiatric/Behavioral:  Positive for sleep disturbance. Negative for depressed mood. The patient is not nervous/anxious.     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   Malnutrition of moderate degree 08/18/2018   HIV disease (HCC) 08/18/2018   Unintentional weight loss 08/18/2018   Rheumatoid arthritis (HCC) 08/18/2018   Knee mass, right 08/18/2018   Pancytopenia (HCC) 08/18/2018   Cigarette smoker 08/18/2018   GERD (gastroesophageal reflux disease) 08/18/2018   Psoriasis 08/18/2018   History of MRSA infection 08/18/2018   Neoplasm 06/21/2018   Mass 04/08/2018    Past Medical History:  Diagnosis Date   Angina pectoris    HIV (human immunodeficiency virus infection) (HCC)    Seropositive rheumatoid arthritis (HCC)     Family History  Problem Relation Age of Onset   Heart disease Mother    Cancer Mother     Cancer Father    Heart disease Father    Cancer Sister    Heart disease Brother    Healthy Son    Healthy Daughter    Past Surgical History:  Procedure Laterality Date   KNEE ARTHROSCOPY Left    Right knee surgery     Mass - at [facility]   Social History[1] Social History   Social History Narrative   Not on file     Immunization History  Administered Date(s) Administered   Influenza,inj,Quad PF,6+ Mos 09/02/2019     Objective: Vital Signs: BP (!) 143/87   Pulse 69   Temp 97.9 F (36.6 C)   Resp 16   Ht 6' 1 (1.854 m)   Wt 202 lb (91.6 kg)   BMI 26.65 kg/m    Physical Exam Vitals and nursing note reviewed.  Constitutional:      Appearance: He is well-developed.  HENT:     Head: Normocephalic and atraumatic.  Eyes:     Conjunctiva/sclera: Conjunctivae normal.     Pupils: Pupils are equal, round, and reactive to light.  Cardiovascular:     Rate and Rhythm: Normal rate and regular rhythm.     Heart sounds: Normal heart sounds.  Pulmonary:     Effort: Pulmonary effort is normal.     Breath sounds: Normal breath sounds.  Abdominal:     General: Bowel sounds are normal.     Palpations: Abdomen is soft.  Musculoskeletal:     Cervical back: Normal range of motion and neck supple.  Skin:    General: Skin is warm and dry.     Capillary Refill: Capillary refill takes less than 2 seconds.  Neurological:     Mental Status: He is alert and oriented to person, place, and time.  Psychiatric:        Behavior: Behavior normal.      Musculoskeletal Exam: Patient remained seated during examination today.  C-spine has limited range of motion with lateral rotation.  Shoulder joints have limited abduction to 110 degrees bilaterally.  Flexion contracture of both elbows, left more severe than right.  Tenderness and mild warmth along the left elbow joint line.  Wrist joints, MCPs, PIPs, DIPs have good range of motion with no synovitis.  Complete fist formation bilaterally.  Painful  range of motion of both knees but no effusion noted.  Slightly limited extension of both knee joints noted.  Severe pain and swelling of the right ankle.   CDAI Exam: CDAI Score: -- Patient Global: --; Provider Global: -- Swollen: --; Tender: -- Joint Exam 12/14/2024   No joint exam has been documented for this visit   There is currently no information documented on the homunculus. Go to the Rheumatology activity and complete the homunculus joint exam.  Investigation: No additional findings.  Imaging: No results found.  Recent Labs: Lab Results  Component Value Date   WBC 4.3 09/08/2024   HGB 13.4 09/08/2024   PLT 172 09/08/2024   NA 139 09/08/2024   K 3.8 09/08/2024   CL 100 09/08/2024   CO2 32 09/08/2024   GLUCOSE 190 (H) 09/08/2024   BUN 12 09/08/2024   CREATININE 0.85 09/08/2024   BILITOT 0.4 09/08/2024  ALKPHOS 168 (H) 08/17/2018   AST 13 09/08/2024   ALT 18 09/08/2024   PROT 7.3 09/08/2024   ALBUMIN 2.2 (L) 08/17/2018   CALCIUM  8.8 09/08/2024   GFRAA 129 12/28/2018   QFTBGOLDPLUS NEGATIVE 11/18/2023    Speciality Comments: Enbrel  stopped 08/04/22 Humira  started 08/12/22 Simponi  Aria started 04/20/24 Hydroxychloroquine  d/c due to GI side effects Tyenne  started 07/26/24  Procedures:  No procedures performed Allergies: Meperidine and Meperidine hcl  Assessment / Plan:     Visit Diagnoses: Seropositive rheumatoid arthritis (HCC) - Anti-CCP>250, RF 20, ESR 72, Bilateral knee swelling, bilateral elbow joint contractures, +synovitis: Patient presents today with ongoing pain and stiffness affecting multiple joints.  Symptoms have been most severe affecting the left elbow, both knees, and the right ankle joint.  No knee effusion was noted on examination today.  He has mild warmth along the right elbow joint line.  He has severe pain and swelling affecting the right ankle.  Offered to schedule an ultrasound-guided right ankle injection but he would like to hold off at this  time.  He is currently on Tyenne  162 mg sq injections every 14 days.  Tyenne  was initiated on 07/26/24.  He has been tolerating tyenne  without any side effects or injection site reactions.  Patient feels that his symptoms were better controlled while on Humira  and would like to reapply for Humira  through insurance.  Reviewed indications, contraindications, and potential side effects of Humira  today in detail.  All questions were addressed.  Consent was updated again today.  Plan to start the application process.  He will discontinue tyenne .  He will follow up in 3 months to assess his response.   High risk medication use - Tyenne  162 mg sq injections every 14 days-started 07/26/24 Previous therapy Enbrel , Humira , Simponi  Aria, Plaquenil   CBC and CMP updated on 09/08/2024.  Orders for CBC and CMP were released today. TB Gold negative on 11/18/2023.  Order for TB gold released today. Discussed the importance of holding tyenne  if you develop signs or symptoms of an infection and to resume once the infection  has completely cleared - Plan: CBC with Differential/Platelet, Comprehensive metabolic panel with GFR, QuantiFERON-TB Gold Plus  Screening for tuberculosis - Order for TB gold released today.  Plan: QuantiFERON-TB Gold Plus  Psoriasis: He will be resuming humira  once approved by insurance.  He will discontinue tyenne .   Primary osteoarthritis of both hands: PIP DIP thickening noted.  No synovitis noted.  Complete fist formation bilaterally.  Contracture of joint of both elbows: Tenderness and warmth along the left elbow joint line.  Less inflammation was noted on examination today.   Primary osteoarthritis of both knees: Chronic pain.  Difficulty ambulating due to severity of symptoms.  Difficulty rising from a seated position and climbing steps.  His symptoms have been significantly interfering with his quality of life.  He felt that his pain levels were better controlled while on Humira .  No  effusion was noted today.   He would like to resume Humira  if approved by insurance.  Effusion, left knee - S/P Left knee aspiration and injection on 07/14/2024. 31 L of clear fluid was aspirated.  A cortisone injection was performed and a prednisone  taper was sent 07/14/2024.  No effusion was noted on examination today.  He continues to have chronic pain affecting both knee joints.  He has difficulty rising from a seated position and climbing steps due to severity symptoms.  Primary osteoarthritis of both feet: Patient presents today with ongoing  pain and stiffness affecting the right ankle.  He is difficulty ambulating due to severity of symptoms.  He feels that his pain levels have been inadequately controlled on the current treatment regimen.  He would like to switch back to Humira  which he felt controlled his pain levels better.  Other medical conditions are listed as follows:  Cigarette smoker  HIV disease (HCC)  Late latent syphilis  Pancytopenia (HCC)  History of MRSA infection  Neuropathy    Orders: Orders Placed This Encounter  Procedures   CBC with Differential/Platelet   Comprehensive metabolic panel with GFR   QuantiFERON-TB Gold Plus   No orders of the defined types were placed in this encounter.  Follow-Up Instructions: Return in about 3 months (around 03/14/2025) for Rheumatoid arthritis.   Waddell CHRISTELLA Craze, PA-C  Note - This record has been created using Dragon software.  Chart creation errors have been sought, but may not always  have been located. Such creation errors do not reflect on  the standard of medical care.     [1]  Social History Tobacco Use   Smoking status: Every Day    Current packs/day: 1.00    Average packs/day: 1 pack/day for 25.0 years (25.0 ttl pk-yrs)    Types: Cigarettes    Passive exposure: Never   Smokeless tobacco: Never  Vaping Use   Vaping status: Never Used  Substance Use Topics   Alcohol use: Not Currently   Drug use:  Never   "

## 2024-12-01 ENCOUNTER — Other Ambulatory Visit: Payer: Self-pay

## 2024-12-06 ENCOUNTER — Other Ambulatory Visit: Payer: Self-pay

## 2024-12-08 ENCOUNTER — Other Ambulatory Visit: Payer: Self-pay

## 2024-12-08 NOTE — Progress Notes (Signed)
 Specialty Pharmacy Refill Coordination Note  Patrick Huber is a 54 y.o. male contacted today regarding refills of specialty medication(s) Tocilizumab -aazg (Tyenne )   Patient requested Delivery   Delivery date: 12/10/24   Verified address: [address]   [place] [address]   Medication will be filled on: 12/09/24

## 2024-12-09 ENCOUNTER — Other Ambulatory Visit: Payer: Self-pay

## 2024-12-14 ENCOUNTER — Ambulatory Visit: Attending: Physician Assistant | Admitting: Physician Assistant

## 2024-12-14 ENCOUNTER — Encounter: Payer: Self-pay | Admitting: Physician Assistant

## 2024-12-14 VITALS — BP 143/87 | HR 69 | Temp 97.9°F | Resp 16 | Ht 73.0 in | Wt 202.0 lb

## 2024-12-14 DIAGNOSIS — M059 Rheumatoid arthritis with rheumatoid factor, unspecified: Secondary | ICD-10-CM | POA: Diagnosis not present

## 2024-12-14 DIAGNOSIS — M25462 Effusion, left knee: Secondary | ICD-10-CM

## 2024-12-14 DIAGNOSIS — M170 Bilateral primary osteoarthritis of knee: Secondary | ICD-10-CM

## 2024-12-14 DIAGNOSIS — M19071 Primary osteoarthritis, right ankle and foot: Secondary | ICD-10-CM | POA: Diagnosis not present

## 2024-12-14 DIAGNOSIS — M24521 Contracture, right elbow: Secondary | ICD-10-CM | POA: Diagnosis not present

## 2024-12-14 DIAGNOSIS — G629 Polyneuropathy, unspecified: Secondary | ICD-10-CM

## 2024-12-14 DIAGNOSIS — Z8614 Personal history of Methicillin resistant Staphylococcus aureus infection: Secondary | ICD-10-CM | POA: Diagnosis not present

## 2024-12-14 DIAGNOSIS — M24522 Contracture, left elbow: Secondary | ICD-10-CM

## 2024-12-14 DIAGNOSIS — D61818 Other pancytopenia: Secondary | ICD-10-CM

## 2024-12-14 DIAGNOSIS — M19041 Primary osteoarthritis, right hand: Secondary | ICD-10-CM | POA: Diagnosis not present

## 2024-12-14 DIAGNOSIS — M19072 Primary osteoarthritis, left ankle and foot: Secondary | ICD-10-CM

## 2024-12-14 DIAGNOSIS — L409 Psoriasis, unspecified: Secondary | ICD-10-CM

## 2024-12-14 DIAGNOSIS — M19042 Primary osteoarthritis, left hand: Secondary | ICD-10-CM

## 2024-12-14 DIAGNOSIS — Z79899 Other long term (current) drug therapy: Secondary | ICD-10-CM | POA: Diagnosis not present

## 2024-12-14 DIAGNOSIS — B20 Human immunodeficiency virus [HIV] disease: Secondary | ICD-10-CM | POA: Diagnosis not present

## 2024-12-14 DIAGNOSIS — F17210 Nicotine dependence, cigarettes, uncomplicated: Secondary | ICD-10-CM

## 2024-12-14 DIAGNOSIS — A528 Late syphilis, latent: Secondary | ICD-10-CM | POA: Diagnosis not present

## 2024-12-14 DIAGNOSIS — Z111 Encounter for screening for respiratory tuberculosis: Secondary | ICD-10-CM

## 2024-12-14 NOTE — Patient Instructions (Signed)
 Standing Labs We placed an order today for your standing lab work.   Please have your standing labs drawn in 1 month and then every 3 months.  Please have your labs drawn 2 weeks prior to your appointment so that the provider can discuss your lab results at your appointment, if possible.  Please note that you may see your imaging and lab results in MyChart before we have reviewed them. We will contact you once all results are reviewed. Please allow our office up to 72 hours to thoroughly review all of the results before contacting the office for clarification of your results.  WALK-IN LAB HOURS  Monday through Thursday from 8:00 am - 4:30 pm and Friday from 8:00 am-12:00 pm.  Patients with office visits requiring labs will be seen before walk-in labs.  You may encounter longer than normal wait times. Please allow additional time. Wait times may be shorter on  Monday and Thursday afternoons.  We do not book appointments for walk-in labs. We appreciate your patience and understanding with our staff.   Labs are drawn by [facility]. Please bring your co-pay at the time of your lab draw.  You may receive a bill from [facility] for your lab work.  Please note if you are on Hydroxychloroquine  and and an order has been placed for a Hydroxychloroquine  level,  you will need to have it drawn 4 hours or more after your last dose.  If you wish to have your labs drawn at another location, please call the office 24 hours in advance so we can fax the orders.  The office is located at [address], [place], [place] [address]   If you have any questions regarding directions or hours of operation,  please call [phone].   As a reminder, please drink plenty of water prior to coming for your lab work. Thanks!            Adalimumab  Injection What is this medication? ADALIMUMAB  (ay da LIM yoo mab) treats autoimmune conditions, such as psoriasis, arthritis, Crohn disease, and ulcerative  colitis. It works by slowing down an overactive immune system.  It may also be used to treat hidradenitis suppurativa (HS). HS is a condition that causes painful lumps under the skin in areas such as the armpits and groin. It belongs to a group of medications called TNF inhibitors. It is a monoclonal antibody. This medicine may be used for other purposes; ask your health care provider or pharmacist if you have questions. COMMON BRAND NAME(S): ABRILADA, AMJEVITA , CYLTEZO, HADLIMA , Hulio, Hulio PEN, Humira , HUMIRA  PEN, Hyrimoz , Idacio , Simlandi, Yuflyma , YUSIMRY  What should I tell my care team before I take this medication? They need to know if you have any of these conditions: Cancer Diabetes (high blood sugar) Having surgery Heart disease Hepatitis B Immune system problems Infections, such as tuberculosis (TB) or other bacterial, fungal, or viral infections Multiple sclerosis Recent or upcoming vaccine An unusual or allergic reaction to adalimumab , mannitol, latex, rubber, other medications, foods, dyes, or preservatives Pregnant or trying to get pregnant Breast-feeding How should I use this medication? This medication is injected under the skin. It may be given by your care team in a hospital or clinic setting. It may also be given at home. If you get this medication at home, you will be taught how to prepare and give it. Use exactly as directed. Take it as directed on the prescription label. Keep taking it unless your care team tells you to stop.  This medication comes with INSTRUCTIONS FOR USE. Ask your pharmacist for directions on how to use this medication. Read the information carefully. Talk to your pharmacist or care team if you have questions. It is important that you put your used needles and syringes in a special sharps container. Do not put them in a trash can. If you do not have a sharps container, call your pharmacist or care team to get one. A special MedGuide will be given to you  by the pharmacist with each prescription and refill. If you are getting this medication in a hospital or clinic, a special MedGuide will be given to you before each treatment. Be sure to read this information carefully each time. Talk to your care team about the use of this medication in children. While it be prescribed for children as young as 2 years for selected conditions, precautions do apply. Overdosage: If you think you have taken too much of this medicine contact a poison control center or emergency room at once. NOTE: This medicine is only for you. Do not share this medicine with others. What if I miss a dose? If you get this medication at the hospital or clinic: it is important not to miss your dose. Call your care team if you are unable to keep an appointment. If you give yourself this medication at home: If you miss a dose, take it as soon as you can. If it is almost time for your next dose, take only that dose. Do not take double or extra doses. Call your care team with questions. What may interact with this medication? Do not take this medication with any of the following: Abatacept Anakinra Biologic medications, such as certolizumab, etanercept , golimumab , infliximab Live virus vaccines This medication may also interact with the following: Cyclosporine Theophylline Vaccines Warfarin This list may not describe all possible interactions. Give your health care provider a list of all the medicines, herbs, non-prescription drugs, or dietary supplements you use. Also tell them if you smoke, drink alcohol, or use illegal drugs. Some items may interact with your medicine. What should I watch for while using this medication? Visit your care team for regular checks on your progress. Tell your care team if your symptoms do not start to get better or if they get worse. You will be tested for tuberculosis (TB) before you start this medication. If your care team prescribes any medication for  TB, you should start taking the TB medication before starting this medication. Make sure to finish the full course of TB medication. This medication may increase your risk of getting an infection. Call your care team for advice if you get a fever, chills, sore throat, or other symptoms of a cold or flu. Do not treat yourself. Try to avoid being around people who are sick. Talk to your care team about your risk of cancer. You may be more at risk for certain types of cancer if you take this medication. What side effects may I notice from receiving this medication? Side effects that you should report to your care team as soon as possible: Allergic reactions--skin rash, itching, hives, swelling of the face, lips, tongue, or throat Aplastic anemia--unusual weakness or fatigue, dizziness, headache, trouble breathing, increased bleeding or bruising Body pain, tingling, or numbness Heart failure--shortness of breath, swelling of the ankles, feet, or hands, sudden weight gain, unusual weakness or fatigue Infection--fever, chills, cough, sore throat, wounds that don't heal, pain or trouble when passing urine, general feeling of discomfort  or being unwell Lupus-like syndrome--joint pain, swelling, or stiffness, butterfly-shaped rash on the face, rashes that get worse in the sun, fever, unusual weakness or fatigue Unusual bruising or bleeding Side effects that usually do not require medical attention (report to your care team if they continue or are bothersome): Headache Nausea Pain, redness, or irritation at injection site Runny or stuffy nose Sore throat Stomach pain This list may not describe all possible side effects. Call your doctor for medical advice about side effects. You may report side effects to FDA at 1-800-FDA-1088. Where should I keep my medication? Keep out of the reach of children and pets. See product label for storage information. Each product may have different instructions. Get rid of  any unused medication as instructed or after the expiration date, whichever is first. To get rid of medications that are no longer needed or have expired: Take the medication to a take-back program. Check with your pharmacy or law enforcement to find a location. If you cannot return the medication, ask your pharmacist or care team how to get rid of this medication safely. NOTE: This sheet is a summary. It may not cover all possible information. If you have questions about this medicine, talk to your doctor, pharmacist, or health care provider.  2025 Elsevier/Gold Standard (2023-11-20 00:00:00)

## 2024-12-15 ENCOUNTER — Telehealth: Payer: Self-pay | Admitting: Pharmacist

## 2024-12-15 ENCOUNTER — Ambulatory Visit: Payer: Self-pay | Admitting: Physician Assistant

## 2024-12-15 DIAGNOSIS — Z79899 Other long term (current) drug therapy: Secondary | ICD-10-CM

## 2024-12-15 NOTE — Progress Notes (Signed)
 Glucose is 130, rest of CMP WNL.  WBC count is borderline low, rest of CBC WNL.  Patient will be stopping tyenne  and will be switching back to Humira .   Recommend rechecking CBC and CMP in 1 month.

## 2024-12-15 NOTE — Telephone Encounter (Signed)
 Humira  restart - Humira  is no longer on formulary  Submitted a Prior Authorization request to [employer] for YUFLYMA  (ADALIMUMAB -AATY) via CoverMyMeds. Will update once we receive a response.  Key: [id number]

## 2024-12-16 ENCOUNTER — Other Ambulatory Visit (HOSPITAL_COMMUNITY): Payer: Self-pay

## 2024-12-16 ENCOUNTER — Other Ambulatory Visit: Payer: Self-pay

## 2024-12-16 LAB — COMPREHENSIVE METABOLIC PANEL WITH GFR
AG Ratio: 1.3 (calc) (ref 1.0–2.5)
ALT: 32 U/L (ref 9–46)
AST: 19 U/L (ref 10–35)
Albumin: 4.4 g/dL (ref 3.6–5.1)
Alkaline phosphatase (APISO): 75 U/L (ref 35–144)
BUN: 13 mg/dL (ref 7–25)
CO2: 31 mmol/L (ref 20–32)
Calcium: 8.9 mg/dL (ref 8.6–10.3)
Chloride: 99 mmol/L (ref 98–110)
Creat: 0.8 mg/dL (ref 0.70–1.30)
Globulin: 3.3 g/dL (ref 1.9–3.7)
Glucose, Bld: 130 mg/dL — ABNORMAL HIGH (ref 65–99)
Potassium: 3.8 mmol/L (ref 3.5–5.3)
Sodium: 137 mmol/L (ref 135–146)
Total Bilirubin: 0.6 mg/dL (ref 0.2–1.2)
Total Protein: 7.7 g/dL (ref 6.1–8.1)
eGFR: 106 mL/min/1.73m2

## 2024-12-16 LAB — CBC WITH DIFFERENTIAL/PLATELET
Absolute Lymphocytes: 1391 {cells}/uL (ref 850–3900)
Absolute Monocytes: 233 {cells}/uL (ref 200–950)
Basophils Absolute: 19 {cells}/uL (ref 0–200)
Basophils Relative: 0.5 %
Eosinophils Absolute: 52 {cells}/uL (ref 15–500)
Eosinophils Relative: 1.4 %
HCT: 44.9 % (ref 39.4–51.1)
Hemoglobin: 15.1 g/dL (ref 13.2–17.1)
MCH: 31.9 pg (ref 27.0–33.0)
MCHC: 33.6 g/dL (ref 31.6–35.4)
MCV: 94.9 fL (ref 81.4–101.7)
MPV: 9.4 fL (ref 7.5–12.5)
Monocytes Relative: 6.3 %
Neutro Abs: 2005 {cells}/uL (ref 1500–7800)
Neutrophils Relative %: 54.2 %
Platelets: 173 Thousand/uL (ref 140–400)
RBC: 4.73 Million/uL (ref 4.20–5.80)
RDW: 13.9 % (ref 11.0–15.0)
Total Lymphocyte: 37.6 %
WBC: 3.7 Thousand/uL — ABNORMAL LOW (ref 3.8–10.8)

## 2024-12-16 LAB — QUANTIFERON-TB GOLD PLUS
Mitogen-NIL: 7.55 [IU]/mL
NIL: 0.03 [IU]/mL
QuantiFERON-TB Gold Plus: NEGATIVE
TB1-NIL: 0 [IU]/mL
TB2-NIL: 0 [IU]/mL

## 2024-12-16 MED ORDER — YUFLYMA (2 PEN) 40 MG/0.4ML ~~LOC~~ AJKT
40.0000 mg | AUTO-INJECTOR | SUBCUTANEOUS | 2 refills | Status: AC
  Filled 2024-12-16: qty 2, 28d supply, fill #0

## 2024-12-16 NOTE — Telephone Encounter (Signed)
 Received notification from [facility] regarding a prior authorization for ADALIMUMAB -AATY. Authorization has been CANCELLED, stating that there is already an authorization on with with approval dates 12/02/2024 to 12/01/2025. Cancellation letter sent to scan center for retention.  Authorization # [id number]

## 2024-12-16 NOTE — Progress Notes (Signed)
 Specialty Pharmacy Initial Fill Coordination Note  Patrick Huber is a 54 y.o. male contacted today regarding initial fill of specialty medication(s) Adalimumab -aaty (Yuflyma  (2 Pen))   Patient requested Delivery   Delivery date: 12/21/24   Verified address: [address]  [place] [address]   Medication will be filled on: 12/20/24   Patient is aware of $5.10 copayment. FILL AS GENERIC

## 2024-12-17 NOTE — Progress Notes (Signed)
 TB Gold is negative.

## 2024-12-17 NOTE — Progress Notes (Signed)
 Patient restarting adalimumab  for RA. Has failed multiple other treatments. Last Tyenne  dose was on 12/08/2024. He is aware that he will be able to start adalimumab  on or after 12/22/2024.  He did not have any questions or concerns. Reviewed holding medication if he has active infection or is taking antibiotics.  Sherry Pennant, PharmD, MPH, BCPS, CPP Clinical Pharmacist

## 2024-12-19 ENCOUNTER — Other Ambulatory Visit: Payer: Self-pay | Admitting: Rheumatology

## 2024-12-20 ENCOUNTER — Other Ambulatory Visit: Payer: Self-pay

## 2024-12-20 NOTE — Telephone Encounter (Signed)
 Last Fill: 09/27/2024  Labs: 12/14/2024 Glucose is 130, rest of CMP WNL.  WBC count is borderline low, rest of CBC WNL.  Patient will be stopping tyenne  and will be switching back to Humira .   Recommend rechecking CBC and CMP in 1 month.   Next Visit: 03/16/2025  Last Visit: 12/14/2024  DX: Seropositive rheumatoid arthritis   Current Dose per office note on 12/14/2024: not mentioned   Okay to refill Diclofenac ?

## 2024-12-20 NOTE — Telephone Encounter (Signed)
 I called patient and discussed that diclofenac  50 mg tablet interferes with the antiviral medication.  Diclofenac  can cause increased hepatic and renal toxicit when used with antiviral medication.  Patient states he cannot function without diclofenac .  He has been getting labs every 3 months.  I advised him to come every 3 months on a routine basis to get labs.  He voiced understanding.

## 2024-12-21 ENCOUNTER — Other Ambulatory Visit: Payer: Self-pay

## 2024-12-31 ENCOUNTER — Telehealth: Payer: Self-pay | Admitting: Physician Assistant

## 2024-12-31 DIAGNOSIS — M25571 Pain in right ankle and joints of right foot: Secondary | ICD-10-CM

## 2024-12-31 MED ORDER — PREDNISONE 5 MG PO TABS: ORAL_TABLET | ORAL | 0 refills | Status: AC

## 2024-12-31 NOTE — Telephone Encounter (Signed)
 Taper sent. Referral signed.

## 2024-12-31 NOTE — Telephone Encounter (Signed)
 Patient advised Waddell recommends urgent evaluation at [facility] to assess need for aspiration and injection.  Patient state he has never seen [facility] and is requesting a referral.  Dr. Dolphus does not have any urgent appt available currently.  We can send in a prednisone  taper in the meantime if he would like one. Patient would like a prednisone  taper to be sent to the pharmacy.

## 2024-12-31 NOTE — Telephone Encounter (Signed)
 Attempted to contact the patient and lost connection. Will try to call patient again later.

## 2024-12-31 NOTE — Telephone Encounter (Signed)
 Recommend urgent evaluation at [facility] to assess need for aspiration and injection.   Dr. Dolphus does not have any urgent appt available currently.  We can send in a prednisone  taper in the meantime if he would like one

## 2024-12-31 NOTE — Addendum Note (Signed)
 Addended by: CENA ALFONSO CROME on: 12/31/2024 12:32 PM   Modules accepted: Orders

## 2024-12-31 NOTE — Addendum Note (Signed)
 Addended by: CHERYL WADDELL HERO on: 12/31/2024 05:29 PM   Modules accepted: Orders

## 2024-12-31 NOTE — Telephone Encounter (Signed)
 Patient called and left a message to talk to clinic about his foot pain.

## 2025-01-07 ENCOUNTER — Ambulatory Visit: Admitting: Podiatry

## 2025-01-07 ENCOUNTER — Encounter: Payer: Self-pay | Admitting: Podiatry

## 2025-01-07 ENCOUNTER — Ambulatory Visit

## 2025-01-07 DIAGNOSIS — M05771 Rheumatoid arthritis with rheumatoid factor of right ankle and foot without organ or systems involvement: Secondary | ICD-10-CM

## 2025-01-07 DIAGNOSIS — R600 Localized edema: Secondary | ICD-10-CM

## 2025-01-07 DIAGNOSIS — M7751 Other enthesopathy of right foot: Secondary | ICD-10-CM

## 2025-01-07 DIAGNOSIS — M25571 Pain in right ankle and joints of right foot: Secondary | ICD-10-CM

## 2025-01-07 MED ORDER — TRIAMCINOLONE ACETONIDE 10 MG/ML IJ SUSP
10.0000 mg | Freq: Once | INTRAMUSCULAR | Status: AC
  Administered 2025-01-07: 10 mg

## 2025-01-07 NOTE — Progress Notes (Signed)
 "   Chief Complaint  Patient presents with   Ankle Pain    Right ankle pain, HX of Rheumatoid Arthritis. Pain in the lateral side that shoots to his hip when walking. Swelling today is mild, it does swell large enough that he can not get his boot off. They drained fluid off his knee 2 months ago and sent him here to have ankle drained.  Not diabetic and no anti coag.    Discussed the use of AI scribe software for clinical note transcription with the patient, who gave verbal consent to proceed.  History of Present Illness Patrick Huber is a 54 year old male with severe rheumatoid arthritis and prior right ankle fracture who presents for evaluation of chronic worsening right ankle pain and swelling.  Pain is 10/10.  He is walking with a limp secondary to the pain.  He has experienced chronic right ankle pain and swelling for several years, with progressive worsening. The pain is severe and significantly impairs weight-bearing and ambulation, to the extent that he is unable to walk from his location to his truck. Swelling fluctuates and is not consistently related to time of day or activity. He is unable to wear his boot due to swelling. Pain is exacerbated by stepping on objects, such as an extension cord, resulting in shooting pain across the ankle. The pain is localized to the right ankle, with no involvement of the left. He denies heel pain.  He has trialed compression socks, which caused discomfort by pushing swelling into his knees and hips. He uses an over-the-counter stretch ankle brace, which provides temporary relief for approximately twenty minutes but may increase swelling with prolonged use. There is no history of prior right ankle injuries except for a fracture approximately one year ago after a fall. The fracture was diagnosed the following day.  He has severe rheumatoid arthritis with frequent flares and remissions. He has been on Humira  for several years, initially every two weeks  with good effect, but efficacy decreased as the medication wore off several days before the next dose. He was switched to another medication, which was ineffective, and then returned to Humira , which provided better control. He recently started a generic version of Humira  last week. He has previously used prednisone  during flares and currently has a prescription available if needed, but reports no current benefit.  Approximately three months ago, his knee was drained by Dr. Donney, with 36 mL of fluid removed. He was previously offered a similar procedure for his ankle but declined due to concerns about pain. He was referred to the current provider after contacting the previous office due to ongoing severe symptoms.    Past Medical History:  Diagnosis Date   Angina pectoris    HIV (human immunodeficiency virus infection) (HCC)    Seropositive rheumatoid arthritis (HCC)    Past Surgical History:  Procedure Laterality Date   KNEE ARTHROSCOPY Left    Right knee surgery     [facility] at [facility]   Allergies[1]  Physical Exam CARDIOVASCULAR: Pulses intact. MUSCULOSKELETAL: Right heel non-tender. Limited range of motion in right ankle.  No significant pain on palpation to the anterior and anterolateral aspect of the right ankle.  Localized edema around the right ankle.  Mild increase in warmth to the right ankle.  Pain with motion of the right ankle joint and no pain with the range of motion of the subtalar joint.  Epicritic sensation intact.  Antalgic gait noted on weightbearing  Radiology (  right ankle, 3 weightbearing views, 01/07/2025): There is mild decrease joint space at the right ankle.  Mild decreased osseous mineralization throughout the bones of the ankle and foot.  Some degenerative joint changes are seen at the dorsal aspect of the midtarsal joint.  No fracture seen  Assessment/Plan of Care: 1. Pain in joint of right ankle   2. Capsulitis of right ankle   3. Localized edema   4. Rheumatoid  arthritis involving right ankle with positive rheumatoid factor (HCC)      Meds ordered this encounter  Medications   triamcinolone  acetonide (KENALOG ) 10 MG/ML injection 10 mg   Assessment & Plan Chronic right ankle pain / RA, with effusion and history of old prior fracture Chronic, progressively worsening right ankle pain and swelling with significant functional limitation, in the context of severe rheumatoid arthritis and prior right ankle fracture. Ongoing inflammatory arthritis is suspected as the primary etiology, with consideration for possible joint infection or other causes of effusion. Previous conservative measures have provided minimal relief. Due to severity of symptoms and impact on mobility, further diagnostic and therapeutic intervention is indicated. - Administered local anesthetic injection to right ankle for field block anesthesia prior to procedure.  1cc 1% lidocaine  plain administered to anterolateral ankle joint.   - Planned aspiration of right ankle joint effusion for synovial fluid analysis to evaluate for infection and confirm inflammatory etiology.  No aspirate obtained. - Planned intra-articular corticosteroid injection following aspiration to reduce inflammation and provide symptomatic relief.  0.5% bupivacaine and Kenalog  40 administered for a total of 1.5cc administered, followed by Bandaid.  Postinjection pain was 4/10  Follow-up as needed   Keelen Quevedo CHARM [place], DPM, FACFAS [facility]     [address] [place], [place] [address]                Office [phone]  Fax [phone]     [1]  Allergies Allergen Reactions   Meperidine Anaphylaxis   Meperidine Hcl     Other reaction(s): Unknown   "

## 2025-03-16 ENCOUNTER — Ambulatory Visit: Admitting: Physician Assistant
